# Patient Record
Sex: Male | Born: 1965 | Race: White | Hispanic: No | Smoking: Never smoker
Health system: Southern US, Community
[De-identification: ages and names within clinical notes are randomized; demographics above are authoritative.]

## PROBLEM LIST (undated history)

## (undated) DIAGNOSIS — I2510 Atherosclerotic heart disease of native coronary artery without angina pectoris: Secondary | ICD-10-CM

## (undated) DIAGNOSIS — I219 Acute myocardial infarction, unspecified: Secondary | ICD-10-CM

## (undated) DIAGNOSIS — Z95810 Presence of automatic (implantable) cardiac defibrillator: Secondary | ICD-10-CM

## (undated) DIAGNOSIS — G8929 Other chronic pain: Secondary | ICD-10-CM

## (undated) DIAGNOSIS — E119 Type 2 diabetes mellitus without complications: Secondary | ICD-10-CM

## (undated) DIAGNOSIS — E785 Hyperlipidemia, unspecified: Secondary | ICD-10-CM

## (undated) DIAGNOSIS — IMO0002 Reserved for concepts with insufficient information to code with codable children: Secondary | ICD-10-CM

## (undated) DIAGNOSIS — K5792 Diverticulitis of intestine, part unspecified, without perforation or abscess without bleeding: Secondary | ICD-10-CM

## (undated) DIAGNOSIS — F419 Anxiety disorder, unspecified: Secondary | ICD-10-CM

## (undated) DIAGNOSIS — I10 Essential (primary) hypertension: Secondary | ICD-10-CM

## (undated) DIAGNOSIS — R079 Chest pain, unspecified: Secondary | ICD-10-CM

## (undated) DIAGNOSIS — M549 Dorsalgia, unspecified: Secondary | ICD-10-CM

## (undated) DIAGNOSIS — S82899A Other fracture of unspecified lower leg, initial encounter for closed fracture: Secondary | ICD-10-CM

## (undated) DIAGNOSIS — M7989 Other specified soft tissue disorders: Secondary | ICD-10-CM

## (undated) HISTORY — PX: CARDIAC DEFIBRILLATOR PLACEMENT: SHX171

## (undated) HISTORY — DX: Chest pain, unspecified: R07.9

## (undated) HISTORY — PX: TONSILLECTOMY: SUR1361

## (undated) HISTORY — PX: EYE SURGERY: SHX253

## (undated) HISTORY — PX: HERNIA REPAIR: SHX51

---

## 2003-11-07 HISTORY — PX: CARDIAC DEFIBRILLATOR PLACEMENT: SHX171

## 2003-11-07 HISTORY — PX: PACEMAKER INSERTION: SHX728

## 2006-05-28 ENCOUNTER — Emergency Department (HOSPITAL_COMMUNITY): Admission: EM | Admit: 2006-05-28 | Discharge: 2006-05-28 | Payer: Self-pay | Admitting: Emergency Medicine

## 2010-01-24 ENCOUNTER — Emergency Department (HOSPITAL_COMMUNITY): Admission: EM | Admit: 2010-01-24 | Discharge: 2010-01-24 | Payer: Self-pay | Admitting: Emergency Medicine

## 2010-05-31 ENCOUNTER — Emergency Department (HOSPITAL_COMMUNITY): Admission: EM | Admit: 2010-05-31 | Discharge: 2010-05-31 | Payer: Self-pay | Admitting: Emergency Medicine

## 2010-08-11 ENCOUNTER — Emergency Department (HOSPITAL_COMMUNITY): Admission: EM | Admit: 2010-08-11 | Discharge: 2010-08-11 | Payer: Self-pay | Admitting: Emergency Medicine

## 2010-11-06 DIAGNOSIS — M7989 Other specified soft tissue disorders: Secondary | ICD-10-CM

## 2010-11-06 DIAGNOSIS — IMO0002 Reserved for concepts with insufficient information to code with codable children: Secondary | ICD-10-CM

## 2010-11-06 DIAGNOSIS — S82899A Other fracture of unspecified lower leg, initial encounter for closed fracture: Secondary | ICD-10-CM

## 2010-11-06 HISTORY — DX: Other specified soft tissue disorders: M79.89

## 2010-11-06 HISTORY — DX: Other fracture of unspecified lower leg, initial encounter for closed fracture: S82.899A

## 2010-11-06 HISTORY — DX: Reserved for concepts with insufficient information to code with codable children: IMO0002

## 2011-01-30 LAB — DIFFERENTIAL
Basophils Absolute: 0.1 10*3/uL (ref 0.0–0.1)
Basophils Relative: 1 % (ref 0–1)
Eosinophils Absolute: 0.4 10*3/uL (ref 0.0–0.7)
Lymphocytes Relative: 26 % (ref 12–46)
Lymphs Abs: 2.4 10*3/uL (ref 0.7–4.0)
Monocytes Relative: 9 % (ref 3–12)

## 2011-01-30 LAB — URINALYSIS, ROUTINE W REFLEX MICROSCOPIC
Hgb urine dipstick: NEGATIVE
Ketones, ur: NEGATIVE mg/dL
Protein, ur: NEGATIVE mg/dL
Urobilinogen, UA: 0.2 mg/dL (ref 0.0–1.0)
pH: 6.5 (ref 5.0–8.0)

## 2011-01-30 LAB — COMPREHENSIVE METABOLIC PANEL
ALT: 35 U/L (ref 0–53)
Albumin: 3.8 g/dL (ref 3.5–5.2)
Alkaline Phosphatase: 37 U/L — ABNORMAL LOW (ref 39–117)
CO2: 23 mEq/L (ref 19–32)
Chloride: 107 mEq/L (ref 96–112)
Creatinine, Ser: 0.93 mg/dL (ref 0.4–1.5)
GFR calc non Af Amer: >60 mL/min (ref >60)
Glucose, Bld: 117 mg/dL — ABNORMAL HIGH (ref 70–99)
Potassium: 4 mEq/L (ref 3.5–5.1)
Sodium: 142 mEq/L (ref 135–145)
Total Protein: 7.4 g/dL (ref 6.0–8.3)

## 2011-01-30 LAB — POCT CARDIAC MARKERS
CKMB, poc: <1 ng/mL — ABNORMAL LOW (ref 1.0–8.0)
CKMB, poc: <1 ng/mL — ABNORMAL LOW (ref 1.0–8.0)
Myoglobin, poc: 56.9 ng/mL (ref 12–200)
Troponin i, poc: <0.05 ng/mL (ref 0.00–0.09)
Troponin i, poc: <0.05 ng/mL (ref 0.00–0.09)

## 2011-01-30 LAB — CBC
RBC: 4.94 MIL/uL (ref 4.22–5.81)
WBC: 9.2 10*3/uL (ref 4.0–10.5)

## 2011-01-30 LAB — BRAIN NATRIURETIC PEPTIDE: Pro B Natriuretic peptide (BNP): 55 pg/mL (ref 0.0–100.0)

## 2011-12-30 ENCOUNTER — Emergency Department (HOSPITAL_COMMUNITY): Payer: Medicaid Other

## 2011-12-30 ENCOUNTER — Other Ambulatory Visit: Payer: Self-pay

## 2011-12-30 ENCOUNTER — Inpatient Hospital Stay (HOSPITAL_COMMUNITY)
Admission: EM | Admit: 2011-12-30 | Discharge: 2012-01-04 | DRG: 247 | Disposition: A | Discharge status: Home / Self Care | Payer: Medicaid Other | Attending: Cardiovascular Disease | Admitting: Cardiovascular Disease

## 2011-12-30 ENCOUNTER — Encounter (HOSPITAL_COMMUNITY)
Admission: EM | Disposition: A | Discharge status: Home / Self Care | Payer: Self-pay | Source: Home / Self Care | Attending: Cardiovascular Disease

## 2011-12-30 ENCOUNTER — Encounter (HOSPITAL_COMMUNITY): Payer: Self-pay | Admitting: Physical Medicine and Rehabilitation

## 2011-12-30 DIAGNOSIS — I252 Old myocardial infarction: Secondary | ICD-10-CM

## 2011-12-30 DIAGNOSIS — I213 ST elevation (STEMI) myocardial infarction of unspecified site: Secondary | ICD-10-CM | POA: Diagnosis present

## 2011-12-30 DIAGNOSIS — M549 Dorsalgia, unspecified: Secondary | ICD-10-CM | POA: Diagnosis present

## 2011-12-30 DIAGNOSIS — Z9861 Coronary angioplasty status: Secondary | ICD-10-CM

## 2011-12-30 DIAGNOSIS — I471 Supraventricular tachycardia: Secondary | ICD-10-CM | POA: Diagnosis not present

## 2011-12-30 DIAGNOSIS — Z7982 Long term (current) use of aspirin: Secondary | ICD-10-CM

## 2011-12-30 DIAGNOSIS — Z95810 Presence of automatic (implantable) cardiac defibrillator: Secondary | ICD-10-CM

## 2011-12-30 DIAGNOSIS — G8929 Other chronic pain: Secondary | ICD-10-CM | POA: Diagnosis present

## 2011-12-30 DIAGNOSIS — I2109 ST elevation (STEMI) myocardial infarction involving other coronary artery of anterior wall: Principal | ICD-10-CM | POA: Diagnosis present

## 2011-12-30 DIAGNOSIS — Z7902 Long term (current) use of antithrombotics/antiplatelets: Secondary | ICD-10-CM

## 2011-12-30 DIAGNOSIS — I509 Heart failure, unspecified: Secondary | ICD-10-CM

## 2011-12-30 DIAGNOSIS — I472 Ventricular tachycardia, unspecified: Secondary | ICD-10-CM | POA: Diagnosis not present

## 2011-12-30 DIAGNOSIS — I4729 Other ventricular tachycardia: Secondary | ICD-10-CM | POA: Diagnosis not present

## 2011-12-30 DIAGNOSIS — F17200 Nicotine dependence, unspecified, uncomplicated: Secondary | ICD-10-CM | POA: Diagnosis present

## 2011-12-30 DIAGNOSIS — I2510 Atherosclerotic heart disease of native coronary artery without angina pectoris: Secondary | ICD-10-CM | POA: Diagnosis present

## 2011-12-30 DIAGNOSIS — I2589 Other forms of chronic ischemic heart disease: Secondary | ICD-10-CM | POA: Diagnosis present

## 2011-12-30 DIAGNOSIS — I440 Atrioventricular block, first degree: Secondary | ICD-10-CM | POA: Diagnosis present

## 2011-12-30 DIAGNOSIS — Z23 Encounter for immunization: Secondary | ICD-10-CM

## 2011-12-30 DIAGNOSIS — I255 Ischemic cardiomyopathy: Secondary | ICD-10-CM | POA: Diagnosis present

## 2011-12-30 DIAGNOSIS — I10 Essential (primary) hypertension: Secondary | ICD-10-CM | POA: Diagnosis present

## 2011-12-30 DIAGNOSIS — Z79899 Other long term (current) drug therapy: Secondary | ICD-10-CM

## 2011-12-30 DIAGNOSIS — I2119 ST elevation (STEMI) myocardial infarction involving other coronary artery of inferior wall: Secondary | ICD-10-CM | POA: Diagnosis present

## 2011-12-30 DIAGNOSIS — E669 Obesity, unspecified: Secondary | ICD-10-CM | POA: Diagnosis present

## 2011-12-30 DIAGNOSIS — I2582 Chronic total occlusion of coronary artery: Secondary | ICD-10-CM | POA: Diagnosis present

## 2011-12-30 HISTORY — DX: Atherosclerotic heart disease of native coronary artery without angina pectoris: I25.10

## 2011-12-30 HISTORY — DX: Acute myocardial infarction, unspecified: I21.9

## 2011-12-30 HISTORY — PX: CARDIAC CATHETERIZATION: SHX172

## 2011-12-30 HISTORY — DX: Essential (primary) hypertension: I10

## 2011-12-30 HISTORY — PX: LEFT HEART CATHETERIZATION WITH CORONARY ANGIOGRAM: SHX5451

## 2011-12-30 LAB — DIFFERENTIAL
Basophils Absolute: 0 10*3/uL (ref 0.0–0.1)
Basophils Relative: 0 % (ref 0–1)
Eosinophils Absolute: 0 10*3/uL (ref 0.0–0.7)
Eosinophils Relative: 0 % (ref 0–5)
Lymphocytes Relative: 4 % — ABNORMAL LOW (ref 12–46)
Lymphs Abs: 0.7 10*3/uL (ref 0.7–4.0)
Monocytes Absolute: 0.8 10*3/uL (ref 0.1–1.0)
Monocytes Relative: 5 % (ref 3–12)
Neutro Abs: 14.9 10*3/uL — ABNORMAL HIGH (ref 1.7–7.7)
Neutrophils Relative %: 91 % — ABNORMAL HIGH (ref 43–77)

## 2011-12-30 LAB — COMPREHENSIVE METABOLIC PANEL
ALT: 32 U/L (ref 0–53)
AST: 85 U/L — ABNORMAL HIGH (ref 0–37)
Albumin: 3.1 g/dL — ABNORMAL LOW (ref 3.5–5.2)
Alkaline Phosphatase: 38 U/L — ABNORMAL LOW (ref 39–117)
BUN: 7 mg/dL (ref 6–23)
CO2: 22 mEq/L (ref 19–32)
Calcium: 8.3 mg/dL — ABNORMAL LOW (ref 8.4–10.5)
Chloride: 106 mEq/L (ref 96–112)
Creatinine, Ser: 0.88 mg/dL (ref 0.50–1.35)
GFR calc Af Amer: >90 mL/min (ref >90)
GFR calc non Af Amer: >90 mL/min (ref >90)
Glucose, Bld: 139 mg/dL — ABNORMAL HIGH (ref 70–99)
Potassium: 4.2 mEq/L (ref 3.5–5.1)
Sodium: 138 mEq/L (ref 135–145)
Total Bilirubin: 0.4 mg/dL (ref 0.3–1.2)
Total Protein: 6.5 g/dL (ref 6.0–8.3)

## 2011-12-30 LAB — CBC
HCT: 50.9 % (ref 39.0–52.0)
HCT: 41.9 % (ref 39.0–52.0)
Hemoglobin: 18.3 g/dL — ABNORMAL HIGH (ref 13.0–17.0)
Hemoglobin: 14.8 g/dL (ref 13.0–17.0)
MCH: 31.6 pg (ref 26.0–34.0)
MCHC: 35.3 g/dL (ref 30.0–36.0)
MCV: 89.3 fL (ref 78.0–100.0)
Platelets: 280 10*3/uL (ref 150–400)
Platelets: 232 10*3/uL (ref 150–400)
RBC: 4.69 MIL/uL (ref 4.22–5.81)
RDW: 13.5 % (ref 11.5–15.5)
RDW: 13.7 % (ref 11.5–15.5)
WBC: 16.4 10*3/uL — ABNORMAL HIGH (ref 4.0–10.5)

## 2011-12-30 LAB — CARDIAC PANEL(CRET KIN+CKTOT+MB+TROPI)
CK, MB: 97.1 ng/mL (ref 0.3–4.0)
CK, MB: 204.3 ng/mL (ref 0.3–4.0)
Relative Index: 8.5 — ABNORMAL HIGH (ref 0.0–2.5)
Relative Index: 9.4 — ABNORMAL HIGH (ref 0.0–2.5)
Total CK: 1136 U/L — ABNORMAL HIGH (ref 7–232)
Total CK: 2171 U/L — ABNORMAL HIGH (ref 7–232)
Troponin I: 24.32 ng/mL (ref <0.30)
Troponin I: >25 ng/mL (ref <0.30)

## 2011-12-30 LAB — POCT I-STAT, CHEM 8
BUN: 5 mg/dL — ABNORMAL LOW (ref 6–23)
Creatinine, Ser: 1 mg/dL (ref 0.50–1.35)
Potassium: 3.2 mEq/L — ABNORMAL LOW (ref 3.5–5.1)

## 2011-12-30 LAB — POCT I-STAT TROPONIN I: Troponin i, poc: 0.01 ng/mL (ref 0.00–0.08)

## 2011-12-30 LAB — PROTIME-INR
INR: 6.8 (ref 0.00–1.49)
INR: 1.85 — ABNORMAL HIGH (ref 0.00–1.49)
INR: 1.27 (ref 0.00–1.49)
Prothrombin Time: 59.9 seconds — ABNORMAL HIGH (ref 11.6–15.2)
Prothrombin Time: 16.2 seconds — ABNORMAL HIGH (ref 11.6–15.2)

## 2011-12-30 LAB — MAGNESIUM: Magnesium: 2 mg/dL (ref 1.5–2.5)

## 2011-12-30 SURGERY — LEFT HEART CATHETERIZATION WITH CORONARY ANGIOGRAM
Anesthesia: LOCAL

## 2011-12-30 MED ORDER — MIDAZOLAM HCL 2 MG/2ML IJ SOLN
INTRAMUSCULAR | Status: AC
  Filled 2011-12-30: qty 2

## 2011-12-30 MED ORDER — NITROGLYCERIN IN D5W 200-5 MCG/ML-% IV SOLN
5.0000 ug/min | INTRAVENOUS | Status: DC
  Administered 2011-12-30: 5 ug/min via INTRAVENOUS

## 2011-12-30 MED ORDER — BIOTENE DRY MOUTH MT LIQD
15.0000 mL | Freq: Two times a day (BID) | OROMUCOSAL | Status: DC
  Administered 2011-12-30 – 2012-01-04 (×8): 15 mL via OROMUCOSAL

## 2011-12-30 MED ORDER — HEPARIN SODIUM (PORCINE) 1000 UNIT/ML IJ SOLN: 5000.0000 [IU] | Freq: Once | INTRAMUSCULAR | Status: DC

## 2011-12-30 MED ORDER — ONDANSETRON HCL 4 MG/2ML IJ SOLN: 4.0000 mg | Freq: Four times a day (QID) | INTRAMUSCULAR | Status: DC | PRN

## 2011-12-30 MED ORDER — AMIODARONE HCL 150 MG/3ML IV SOLN
INTRAVENOUS | Status: AC
  Filled 2011-12-30: qty 3

## 2011-12-30 MED ORDER — ONDANSETRON HCL 4 MG/2ML IJ SOLN
4.0000 mg | Freq: Once | INTRAMUSCULAR | Status: AC
Start: 2011-12-30 — End: 2011-12-30
  Administered 2011-12-30: 4 mg via INTRAVENOUS

## 2011-12-30 MED ORDER — CLOPIDOGREL BISULFATE 300 MG PO TABS
ORAL_TABLET | ORAL | Status: AC
  Filled 2011-12-30: qty 1

## 2011-12-30 MED ORDER — AMIODARONE HCL IN DEXTROSE 360-4.14 MG/200ML-% IV SOLN
30.0000 mg/h | INTRAVENOUS | Status: DC
  Administered 2011-12-30 – 2011-12-31 (×2): 30 mg/h via INTRAVENOUS
  Filled 2011-12-30 (×3): qty 200

## 2011-12-30 MED ORDER — METOPROLOL TARTRATE 1 MG/ML IV SOLN
INTRAVENOUS | Status: AC
  Filled 2011-12-30: qty 5

## 2011-12-30 MED ORDER — MUPIROCIN 2 % EX OINT
1.0000 "application " | TOPICAL_OINTMENT | Freq: Two times a day (BID) | CUTANEOUS | Status: AC
  Administered 2011-12-30 – 2012-01-04 (×10): 1 via NASAL
  Filled 2011-12-30 (×2): qty 22

## 2011-12-30 MED ORDER — METOPROLOL TARTRATE 25 MG PO TABS
25.0000 mg | ORAL_TABLET | Freq: Two times a day (BID) | ORAL | Status: DC
Start: 2011-12-30 — End: 2012-01-04
  Administered 2011-12-30 – 2012-01-04 (×11): 25 mg via ORAL
  Filled 2011-12-30 (×12): qty 1

## 2011-12-30 MED ORDER — ACETAMINOPHEN 325 MG PO TABS: 650.0000 mg | ORAL_TABLET | ORAL | Status: DC | PRN

## 2011-12-30 MED ORDER — NITROGLYCERIN IN D5W 200-5 MCG/ML-% IV SOLN
INTRAVENOUS | Status: AC
  Filled 2011-12-30: qty 250

## 2011-12-30 MED ORDER — ASPIRIN 81 MG PO CHEW
CHEWABLE_TABLET | ORAL | Status: AC
  Administered 2011-12-30: 324 mg
  Filled 2011-12-30: qty 4

## 2011-12-30 MED ORDER — HEPARIN BOLUS VIA INFUSION: 4000.0000 [IU] | Freq: Once | INTRAVENOUS | Status: DC

## 2011-12-30 MED ORDER — NITROGLYCERIN IN D5W 200-5 MCG/ML-% IV SOLN
2.0000 ug/min | INTRAVENOUS | Status: DC
  Administered 2011-12-31: 5 ug/min via INTRAVENOUS

## 2011-12-30 MED ORDER — ALUM & MAG HYDROXIDE-SIMETH 200-200-20 MG/5ML PO SUSP
30.0000 mL | Freq: Four times a day (QID) | ORAL | Status: DC | PRN
  Administered 2011-12-30: 30 mL via ORAL
  Filled 2011-12-30: qty 30

## 2011-12-30 MED ORDER — ATROPINE SULFATE 1 MG/ML IJ SOLN
INTRAMUSCULAR | Status: AC
  Filled 2011-12-30: qty 1

## 2011-12-30 MED ORDER — MORPHINE SULFATE 2 MG/ML IJ SOLN
2.0000 mg | Freq: Once | INTRAMUSCULAR | Status: AC
  Administered 2011-12-30: 2 mg via INTRAVENOUS

## 2011-12-30 MED ORDER — MORPHINE SULFATE 4 MG/ML IJ SOLN
2.0000 mg | INTRAMUSCULAR | Status: DC | PRN
  Administered 2011-12-30: 2 mg via INTRAVENOUS
  Filled 2011-12-30: qty 1

## 2011-12-30 MED ORDER — ONDANSETRON HCL 4 MG/2ML IJ SOLN
INTRAMUSCULAR | Status: AC
  Filled 2011-12-30: qty 2

## 2011-12-30 MED ORDER — CLOPIDOGREL BISULFATE 75 MG PO TABS
75.0000 mg | ORAL_TABLET | Freq: Every day | ORAL | Status: DC
Start: 2011-12-31 — End: 2012-01-04
  Administered 2011-12-31 – 2012-01-04 (×5): 75 mg via ORAL
  Filled 2011-12-30 (×6): qty 1

## 2011-12-30 MED ORDER — AMIODARONE HCL IN DEXTROSE 360-4.14 MG/200ML-% IV SOLN
60.0000 mg/h | INTRAVENOUS | Status: AC
  Administered 2011-12-30: 60 mg/h via INTRAVENOUS

## 2011-12-30 MED ORDER — MORPHINE SULFATE 4 MG/ML IJ SOLN
INTRAMUSCULAR | Status: AC
  Administered 2011-12-30: 4 mg
  Filled 2011-12-30: qty 1

## 2011-12-30 MED ORDER — CHLORHEXIDINE GLUCONATE CLOTH 2 % EX PADS
6.0000 | MEDICATED_PAD | Freq: Every day | CUTANEOUS | Status: AC
  Administered 2011-12-31 – 2012-01-04 (×5): 6 via TOPICAL

## 2011-12-30 MED ORDER — ALPRAZOLAM 0.25 MG PO TABS
0.2500 mg | ORAL_TABLET | Freq: Three times a day (TID) | ORAL | Status: DC | PRN
  Administered 2011-12-30 – 2012-01-04 (×12): 0.25 mg via ORAL
  Filled 2011-12-30 (×12): qty 1

## 2011-12-30 MED ORDER — ASPIRIN EC 81 MG PO TBEC: 81.0000 mg | DELAYED_RELEASE_TABLET | Freq: Every day | ORAL | Status: DC

## 2011-12-30 MED ORDER — SODIUM CHLORIDE 0.9 % IV SOLN: 0.2500 mg/kg/h | INTRAVENOUS | Status: DC

## 2011-12-30 MED ORDER — BIVALIRUDIN 250 MG IV SOLR
INTRAVENOUS | Status: AC
  Filled 2011-12-30: qty 250

## 2011-12-30 MED ORDER — ZOLPIDEM TARTRATE 5 MG PO TABS: 10.0000 mg | ORAL_TABLET | Freq: Every evening | ORAL | Status: DC | PRN

## 2011-12-30 MED ORDER — AMIODARONE HCL IN DEXTROSE 360-4.14 MG/200ML-% IV SOLN
60.0000 mg/h | INTRAVENOUS | Status: DC
  Filled 2011-12-30 (×2): qty 200

## 2011-12-30 MED ORDER — NITROGLYCERIN 0.4 MG SL SUBL: 0.4000 mg | SUBLINGUAL_TABLET | SUBLINGUAL | Status: DC | PRN

## 2011-12-30 MED ORDER — ASPIRIN EC 325 MG PO TBEC
325.0000 mg | DELAYED_RELEASE_TABLET | Freq: Every day | ORAL | Status: DC
  Administered 2011-12-31 – 2012-01-04 (×5): 325 mg via ORAL
  Filled 2011-12-30 (×5): qty 1

## 2011-12-30 MED ORDER — MORPHINE SULFATE 2 MG/ML IJ SOLN
INTRAMUSCULAR | Status: AC
  Filled 2011-12-30: qty 1

## 2011-12-30 MED ORDER — FENTANYL CITRATE 0.05 MG/ML IJ SOLN
INTRAMUSCULAR | Status: AC
  Filled 2011-12-30: qty 2

## 2011-12-30 MED ORDER — LIDOCAINE HCL (PF) 1 % IJ SOLN
INTRAMUSCULAR | Status: AC
  Filled 2011-12-30: qty 30

## 2011-12-30 MED ORDER — NITROGLYCERIN 0.2 MG/ML ON CALL CATH LAB
INTRAVENOUS | Status: AC
  Filled 2011-12-30: qty 1

## 2011-12-30 MED ORDER — COLCHICINE 0.6 MG PO TABS
0.6000 mg | ORAL_TABLET | Freq: Every day | ORAL | Status: DC
  Administered 2011-12-30 – 2012-01-04 (×6): 0.6 mg via ORAL
  Filled 2011-12-30 (×6): qty 1

## 2011-12-30 MED ORDER — HEPARIN SODIUM (PORCINE) 5000 UNIT/ML IJ SOLN
INTRAMUSCULAR | Status: AC
  Administered 2011-12-30: 4000 [IU] via INTRAVENOUS
  Filled 2011-12-30: qty 1

## 2011-12-30 MED ORDER — HEPARIN (PORCINE) IN NACL 2-0.9 UNIT/ML-% IJ SOLN
INTRAMUSCULAR | Status: AC
  Filled 2011-12-30: qty 2000

## 2011-12-30 MED ORDER — SODIUM CHLORIDE 0.9 % IV SOLN
INTRAVENOUS | Status: DC
  Administered 2011-12-31: 08:00:00 via INTRAVENOUS

## 2011-12-30 MED ORDER — OXYCODONE-ACETAMINOPHEN 5-325 MG PO TABS
1.0000 | ORAL_TABLET | ORAL | Status: DC | PRN
  Administered 2011-12-30 – 2012-01-04 (×25): 2 via ORAL
  Filled 2011-12-30 (×25): qty 2

## 2011-12-30 MED ORDER — ATORVASTATIN CALCIUM 40 MG PO TABS
40.0000 mg | ORAL_TABLET | Freq: Every day | ORAL | Status: DC
  Administered 2011-12-30: 40 mg via ORAL
  Filled 2011-12-30 (×2): qty 1

## 2011-12-30 MED ORDER — AMIODARONE HCL IN DEXTROSE 360-4.14 MG/200ML-% IV SOLN
60.0000 mg/h | INTRAVENOUS | Status: AC
  Administered 2011-12-30: 33.3333 mL/h via INTRAVENOUS
  Filled 2011-12-30 (×4): qty 200

## 2011-12-30 MED ORDER — MORPHINE SULFATE 4 MG/ML IJ SOLN: 4.0000 mg | Freq: Once | INTRAMUSCULAR | Status: DC

## 2011-12-30 NOTE — Progress Notes (Signed)
Right femoral artery sheath was pulled out at 2058 and pressure held at site for 30 minutes. Patient education done.

## 2011-12-30 NOTE — ED Notes (Signed)
Pt presents to department via [facility] from home for evaluation midsternal non radiating chest pain and headache. Onset today around 8am. Pt states he became dizzy and defibrillator shocked him x1. N/V began after episode. History of MI x3 with stent placement. He is conscious alert and oriented x4. 20g L hand. NSR on monitor. Received x1 nitroglycerin, 4mg  zofran and 324 ASA per EMS.

## 2011-12-30 NOTE — ED Provider Notes (Addendum)
History     CSN: [id number]  Arrival date & time 12/30/11  1023   First MD Initiated Contact with Patient 12/30/11 1024      Chief Complaint  Patient presents with  . Chest Pain   46 year old gentleman with a known history of previous myocardial infarction and previous cardiac stenting done in [place]. Patient states he awoke with severe, right-sided chest pain, and pressure. This began approximately around 8 AM he then states he became dizzy and apparently has defibrillator  shocked him 1 time at home. He began having nausea and vomiting After this episode. Patient presented by EMS. Patient had been given an aspirin and Zofran in route and had an IV established. Patient was unable to describe the exact character of the pain. This was due to his ongoing pain and discomfort and vomiting. . There was apparently no syncope. He is on multiple cardiac medications including Plavix and has been compliant with this. Family history is unknown. His social history apparently nonsmoker.  Denies any specific alleviating or aggravating factors denies any specific radiation of this pain. He does state that it feels similar to his previous MIs.  (Consider location/radiation/quality/duration/timing/severity/associated sxs/prior treatment) HPI  Past Medical History  Diagnosis Date  . Myocardial infarction   . Hypertension     No past surgical history on file.  History reviewed. No pertinent family history.  History  Substance Use Topics  . Smoking status: Never Smoker   . Smokeless tobacco: Not on file  . Alcohol Use: No      Review of Systems  All other systems reviewed and are negative.    Allergies  Review of patient's allergies indicates no known allergies.  Home Medications  No current outpatient prescriptions on file.  BP 101/61  Pulse 60  Temp(Src) 97.5 F (36.4 C) (Oral)  Resp 20  SpO2 99%  Physical Exam  Nursing note and vitals reviewed. Constitutional: He is oriented  to person, place, and time. He appears well-developed and well-nourished.       The patient appeared to be diaphoretic, and a fair amount of distress secondary to his pain  HENT:  Head: Normocephalic and atraumatic.  Eyes: Conjunctivae and EOM are normal. Pupils are equal, round, and reactive to light.  Neck: Neck supple.  Cardiovascular: Normal rate and regular rhythm.  Exam reveals no gallop and no friction rub.   No murmur heard. Pulmonary/Chest: Breath sounds normal. He has no wheezes. He has no rales. He exhibits no tenderness.  Abdominal: Soft. Bowel sounds are normal. He exhibits no distension. There is no tenderness. There is no rebound and no guarding.  Musculoskeletal: Normal range of motion.  Neurological: He is alert and oriented to person, place, and time. No cranial nerve deficit. Coordination normal.  Skin: Skin is warm. No rash noted. He is diaphoretic.  Psychiatric: He has a normal mood and affect.    ED Course  Procedures (including critical care time)  Labs Reviewed  CBC - Abnormal; Notable for the following:    WBC 16.9 (*)    Hemoglobin 18.3 (*)    All other components within normal limits  POCT I-STAT, CHEM 8 - Abnormal; Notable for the following:    Potassium 3.2 (*)    BUN 5 (*)    Glucose, Bld 142 (*)    Hemoglobin 18.4 (*)    HCT 54.0 (*)    All other components within normal limits  POCT I-STAT TROPONIN I   Dg Chest [facility] 1 [address]  12/30/2011  *RADIOLOGY REPORT*  Clinical Data: Chest pain.  Shortness of breath.  Pacing defibrillator firing today.  PORTABLE CHEST - 1 VIEW 12/30/2011:  Comparison: Two-view chest x-ray 01/24/2010 [facility].  Findings: Suboptimal inspiration due to body habitus which accounts for crowded bronchovascular markings at the bases and accentuates the cardiac silhouette.  Taking this into account, cardiac silhouette enlarged but stable.  Mild diffuse interstitial pulmonary edema.  Small right pleural effusion.  No visible left  pleural effusion.  Left subclavian pacing defibrillator unchanged and appears intact.  IMPRESSION: Suboptimal inspiration.  Mild CHF, with stable cardiomegaly and mild diffuse interstitial pulmonary edema.  Small right pleural effusion.  Original Report Authenticated By: Arnell Sieving, M.D.     1. STEMI (ST elevation myocardial infarction)       MDM  Patient is seen and examined, initial history and physical is completed. Evaluation initiated    Stat EKG done, showing Q waves anteriorly, and subtle ST elevation inferiorly code STEMI was called immediately. Simultaneously,  Patient had a second IV placed was continued on the cardiac monitor, oxygen, continuous pulse oximetry. The patient had already been given an aspirin in route. His initial blood pressure was low therefore, a fluid bolus was given prior to starting nitroglycerin drip. Dr. Nicholaus Bloom was contacted, as well as Jan from the race team. Dr. Nicholaus Bloom did present to the bedside and requested a heparin bolus, which was also given. Stat awaiting for the catheter lab team to arrive to take the patient to the Cath Lab. Blood pressure remained stable with systolic at 106. Stat labs and stat portable chest x-ray had also been ordered simultaneously upon his arrival. Patient remains awake, alert, oriented.  11:41 AM Results for orders placed during the hospital encounter of 12/30/11  CBC      Component Value Range   WBC 16.9 (*) 4.0 - 10.5 (K/uL)   RBC 5.66  4.22 - 5.81 (MIL/uL)   Hemoglobin 18.3 (*) 13.0 - 17.0 (g/dL)   HCT 44.0  10.2 - 72.5 (%)   MCV 89.9  78.0 - 100.0 (fL)   MCH 32.3  26.0 - 34.0 (pg)   MCHC 36.0  30.0 - 36.0 (g/dL)   RDW 36.6  44.0 - 34.7 (%)   Platelets 280  150 - 400 (K/uL)  POCT I-STAT, CHEM 8      Component Value Range   Sodium 142  135 - 145 (mEq/L)   Potassium 3.2 (*) 3.5 - 5.1 (mEq/L)   Chloride 106  96 - 112 (mEq/L)   BUN 5 (*) 6 - 23 (mg/dL)   Creatinine, Ser 4.25  0.50 - 1.35 (mg/dL)   Glucose,  Bld 956 (*) 70 - 99 (mg/dL)   Calcium, Ion 3.87  5.64 - 1.32 (mmol/L)   TCO2 23  0 - 100 (mmol/L)   Hemoglobin 18.4 (*) 13.0 - 17.0 (g/dL)   HCT 33.2 (*) 95.1 - 52.0 (%)  POCT I-STAT TROPONIN I      Component Value Range   Troponin i, poc 0.01  0.00 - 0.08 (ng/mL)   Comment 3            No results found.      CRITICAL CARE Performed by: Orlene Och.   Total critical care time:30  Critical care time was exclusive of separately billable procedures and treating other patients.  Critical care was necessary to treat or prevent imminent or life-threatening deterioration.  Critical care was time spent personally by me on the  following activities: development of treatment plan with patient and/or surrogate as well as nursing, discussions with consultants, evaluation of patient's response to treatment, examination of patient, obtaining history from patient or surrogate, ordering and performing treatments and interventions, ordering and review of laboratory studies, ordering and review of radiographic studies, pulse oximetry and re-evaluation of patient's condition.       Marvena Tally A. Chosen Geske, MD 12/30/11 1056   Date: 12/30/2011    10:27am   Rate: 74  Rhythm: normal sinus rhythm  QRS Axis: normal  Intervals: normal  ST/T Wave abnormalities: ST elevations inferiorly and ST elevations anteriorly  Conduction Disutrbances:none  Narrative Interpretation:   Old EKG Reviewed: changes noted    Date: 12/30/2011    10:33am  Rate: 66  Rhythm: normal sinus rhythm  QRS Axis: normal  Intervals: normal  ST/T Wave abnormalities: ST elevations inferiorly and ST elevations anteriorly  Conduction Disutrbances:none  Narrative Interpretation:   Old EKG Reviewed: changes noted Intermittent pacer spikes and dynamic st changes. Reviewed with cardiologist.      Lorelle Gibbs. Patrica Duel, MD 12/30/11 1102   Medications  METOPROLOL TARTRATE PO (  Oral MAR Hold 12/30/11 1130)  ondansetron (ZOFRAN) 4  MG/2ML injection (not administered)  nitroGLYCERIN 0.2 mg/mL in dextrose 5 % infusion (  Intravenous MAR Hold 12/30/11 1130)  nitroGLYCERIN 0.2 mg/mL in dextrose 5 % infusion (not administered)  colchicine 0.6 MG tablet (  Oral MAR Hold 12/30/11 1130)  enalapril (VASOTEC) 20 MG tablet (  Oral MAR Hold 12/30/11 1130)  ALPRAZolam (XANAX) 0.25 MG tablet (  Oral MAR Hold 12/30/11 1130)  morphine 2 MG/ML injection (not administered)  morphine 4 MG/ML injection (4 mg  Given 12/30/11 1042)  ondansetron (ZOFRAN) injection 4 mg (4 mg Intravenous Given 12/30/11 1044)  heparin 5000 UNIT/ML injection (4000 Units Intravenous Given 12/30/11 1043)  aspirin 81 MG chewable tablet (324 mg  Given 12/30/11 1046)  morphine 2 MG/ML injection 2 mg (2 mg Intravenous Given 12/30/11 1103)  nitroGLYCERIN (NTG ON-CALL) 0.2 mg/mL injection (not administered)  lidocaine (XYLOCAINE) 1 % injection (not administered)  heparin 2-0.9 UNIT/ML-% infusion (not administered)  midazolam (VERSED) 2 MG/2ML injection (not administered)  fentaNYL (SUBLIMAZE) 0.05 MG/ML injection (not administered)  bivalirudin (ANGIOMAX) 250 MG injection (not administered)  amiodarone (CORDARONE) 150 MG/3ML injection (not administered)     Jayla Mackie A. Patrica Duel, MD 12/30/11 1214

## 2011-12-30 NOTE — Progress Notes (Signed)
INR called to Dr Ralene Bathe received.

## 2011-12-30 NOTE — Progress Notes (Signed)
Chaplain's Note:  Responded to code stemi page.  Affirmed pt had family on the way.  Asked to be page if family needed assistance.  Please page if needed or requested. [place]  [phone] on-call pager

## 2011-12-30 NOTE — ED Notes (Signed)
Pt transported to the cath lab, vital signs remain stable. Pt remains alert and oriented x4.

## 2011-12-30 NOTE — H&P (Signed)
Patient ID: Brees Hounshell MRN: [id number], DOB/AGE: 07-13-1966   Admit date: 12/30/2011   Primary Physician: No primary provider on file. Primary Cardiologist: ?  HPI: The patient is a 46 year old male with a history of coronary disease. He apparently had 2 stents placed in 2005 [place]. It appears he was here in March of 2011 and had his defibrillator checked. It is a [employer] that was implanted 2007.  The patient  recently arrived in [place] to visit his sister last night.  Approximatley 8 AM he developed substernal chest pain. He says his defibrillator went off once. EMS was called around 10 AM. His initial EKG did not show any acute ST elevation. In the emergency room he continued to have chest pain and another EKG was done showing inferior and anterior ST elevation. Code STEMI was activated. The patient's extremely uncomfortable and detailed history is difficult to obtain at this time. He is taken urgently to the cath lab by Dr. Tresa Endo.    Problem List: Past Medical History  Diagnosis Date  . Myocardial infarction   . Hypertension     No past surgical history on file.   Allergies: No Known Allergies   Home Medications Percocet (4 different empty bottles) Zofran prn Xanax Enalapril 20mg  Metoprolol 50mg  BID Plavix ASA 81mg   History reviewed. No pertinent family history.   History   Social History  . Marital Status: Married    Spouse Name: N/A    Number of Children: N/A  . Years of Education: N/A   Occupational History  . Not on file.   Social History Main Topics  . Smoking status: Never Smoker   . Smokeless tobacco: Not on file  . Alcohol Use: No  . Drug Use: No  . Sexually Active:    Other Topics Concern  . Not on file   Social History Narrative  . No narrative on file     Review of Systems: Unobtainable at this time secondary to patient condition.   Physical Exam: Blood pressure 101/61, pulse 60, temperature 97.5 F (36.4 C),  temperature source Oral, resp. rate 20, SpO2 99.00%.  General appearance: cooperative, moderate distress, moderately obese, pale and toxic Neck: no adenopathy, no carotid bruit, no JVD, supple, symmetrical, trachea midline and thyroid not enlarged, symmetric, no tenderness/mass/nodules Lungs: clear to auscultation bilaterally Heart: regular rate and rhythm Abdomen: obese, non tender Extremities: extremities normal, atraumatic, no cyanosis or edema Pulses: 2+ and symmetric Skin: cool and clammy Neurologic: Grossly normal    Labs:   Results for orders placed during the hospital encounter of 12/30/11 (from the past 24 hour(s))  CBC     Status: Abnormal   Collection Time   12/30/11 10:25 AM      Component Value Range   WBC 16.9 (*) 4.0 - 10.5 (K/uL)   RBC 5.66  4.22 - 5.81 (MIL/uL)   Hemoglobin 18.3 (*) 13.0 - 17.0 (g/dL)   HCT 40.9  81.1 - 91.4 (%)   MCV 89.9  78.0 - 100.0 (fL)   MCH 32.3  26.0 - 34.0 (pg)   MCHC 36.0  30.0 - 36.0 (g/dL)   RDW 78.2  95.6 - 21.3 (%)   Platelets 280  150 - 400 (K/uL)  POCT I-STAT TROPONIN I     Status: Normal   Collection Time   12/30/11 10:38 AM      Component Value Range   Troponin i, poc 0.01  0.00 - 0.08 (ng/mL)   Comment 3  POCT I-STAT, CHEM 8     Status: Abnormal   Collection Time   12/30/11 10:40 AM      Component Value Range   Sodium 142  135 - 145 (mEq/L)   Potassium 3.2 (*) 3.5 - 5.1 (mEq/L)   Chloride 106  96 - 112 (mEq/L)   BUN 5 (*) 6 - 23 (mg/dL)   Creatinine, Ser 4.09  0.50 - 1.35 (mg/dL)   Glucose, Bld 811 (*) 70 - 99 (mg/dL)   Calcium, Ion 9.14  7.82 - 1.32 (mmol/L)   TCO2 23  0 - 100 (mmol/L)   Hemoglobin 18.4 (*) 13.0 - 17.0 (g/dL)   HCT 95.6 (*) 21.3 - 52.0 (%)     Radiology/Studies: No results found.  EKG:NSR SB with inf lat ST elevation  ASSESSMENT AND PLAN:  Principal Problem:  *STEMI (ST elevation myocardial infarction) Active Problems:  CAD (coronary artery disease), Hx of prior stents ? 2005 in  [place]  ICD (implantable cardiac defibrillator) in place, BS, implanted 2007  Obesity  Plan- Urgent cath. He will need ICD interrogated.  Deland Pretty, PA-C 12/30/2011, 11:17 AM    Patient seen and examined. Agree with assessment and plan. Pt currently resides in [place], and arrived in [place] yesterday to visit sister. According to patient, he is s/p MI 2001 with stenting, and in 2003 with an additional stent.  He is s/p ICD in 2005.  Presents now with STEMI anterioly and also with inferior changes, s/p ICD discharge prior to hospital arrival while in bed.  Discussed with plan, plan emergent cath.   Lennette Bihari, MD, [facility] 12/30/2011 1:39 PM

## 2011-12-30 NOTE — ED Notes (Signed)
Dr. Patrica Duel at the bedside. STEMI called. Dr. Tresa Endo to arrive and evaluate.

## 2011-12-30 NOTE — Brief Op Note (Signed)
12/30/2011  1:28 PM  PATIENT:  Gerald Simpson  46 y.o. male  PRE-OPERATIVE DIAGNOSIS:  STEMI  Full note dictated; see diagram   DICTATION # [id number], [id number]  Lennette Bihari, MD, [facility] 12/30/2011 1:28 PM

## 2011-12-30 NOTE — ED Notes (Signed)
Dr. Tresa Endo and EDP at the bedside. Pt to be taken to cath lab. Vital signs stable at the time.

## 2011-12-30 NOTE — ED Notes (Signed)
Pt presents to department for evaluation of midsternal non radiating chest pain. Onset this morning around 8am. Pt states "this feels like my last MI." upon arrival pt diaphoretic and having 10/10 burning chest pain. Respirations unlabored. Pt conscious alert and oriented x4.

## 2011-12-30 NOTE — Cardiovascular Report (Signed)
NAMEJERIMY, Simpson NO.:  [id number]  MEDICAL RECORD NO.:  [id number]  LOCATION:  2904                         FACILITY:  [facility]  PHYSICIAN:  Nicki Guadalajara, M.D.     DATE OF BIRTH:  1966/06/12  DATE OF PROCEDURE: DATE OF DISCHARGE:                           CARDIAC CATHETERIZATION   INDICATIONS:  [name] is a 46 year old gentleman who resides in [place], [place].  He apparently came to [place] last night to visit his sister.  The patient has significant cardiac history that dates back to 2001 when he suffered his initial myocardial infarction and underwent stenting of a vessel at that time.  In 2003, he apparently suffered another myocardial infarction and underwent stenting at that time.  He tells me he had defibrillator inserted in 2005 and these procedures were done in [place].  Apparently, the patient had been developing recurrent chest pain which somewhat attributed to anxiety. He presented to the [facility] Emergency Room today in the setting of an acute ST-segment elevation MI with diffuse ST-segment elevation anteriorly as well as inferiorly.  The patient also admits that he was shocked by his defibrillator when he developed chest pain which awakened him from sleep.  Initially, in the emergency room, he was hypotensive. He was given heparin 4000 units, morphine and ultimately was transferred acutely to the Cardiac Catheterization Laboratory.  PROCEDURE:  Upon arrival to the Catheterizations Laboratory, the patient was still having significant chest pain.  His right femoral artery was punctured anteriorly and a 6-French sheath was inserted.  Diagnostic catheterization was done utilizing 6-French Judkins #4 left and right coronary catheters.  With the demonstration of total LAD occlusion proximal to the proximal LAD stent, emergent percutaneous coronary intervention was performed.  The patient was given Angiomax bolus plus infusion.   He also received an additional 300 mg of Plavix.  The patient states he had been on Plavix 75 mg daily in addition to atenolol.  After therapeutic anticoagulation was ascertained, 6-French XB-LAD guide was used and Asahi medium wire was able to cross the total proximal LAD occlusion.  The patient developed reperfusion ventricular tachycardia with rates in the 110s-120s.  He was bolused initially with 300 mg of IV amiodarone and was also started on a drip.  In addition, he received IV Lopressor 5 mg.  Initial dilatation was done at the site of the occlusion with a 2.5 x 12 mm Sprinter Legend balloon.  This was then upgraded to a 3.5 x 15 mm noncompliant Sprinter.  This was dilated with restoration of TIMI-3 flow.  It became apparent that there was still narrowing distal to the stented segment.  For this reason, the decision was made to place a stent to cover the entire region and a 3.75 x  24 mm Promus Element DES stent was then inserted.  Post-stent dilatation was done with a 3.75 x 20 mm noncompliant Quantum with dilatation up to 3.75 mm.  It was also apparent that there was probable chronic occlusion of the apical portion of the LAD.  This was crossed with the Asahi medium wire.  PTCA was done with the 2.5 balloon, but on very low atmospheres corresponding  to approximately 2.2 mm.  This apparently was a chronic occlusion.  This did not restore some flow to the apical LAD region. The vessel was too small to stent.  Multiple dilatations were made still with residual narrowing apically in a small-caliber vessel.  The patient again had some recurrent VT during the procedure and did receive an additional 150 mg bolus of amiodarone and did receive additional IV Lopressor.  A pigtail catheter was then inserted and RAO ventriculography was done utilizing 30 mL of contrast at a flow rate of 12 mL/second.  The patient tolerated the procedure well.  At the completion of the procedure, he had stable  hemodynamics, was pain free, and will be transported to the coronary care unit for further treatment.  HEMODYNAMIC DATA:  Central aortic pressure was 120/83.  Left ventricular pressure 120/36.  ANGIOGRAPHIC DATA:  Left main coronary artery was a large smooth vessel that had ostial narrowing of 50-60%.  The left main bifurcated into an LAD and left circumflex system.  The LAD was totally occluded proximally after giving rise to the first septal perforating artery just proximal to the stented segment.  There was TIMI 0 flow.  Circumflex vessel was a large dominant vessel.  There did appear to be a long area of stented segment in the mid AV groove circumflex just after the second marginal branch and prior to the distal vessels.  The right coronary artery was a nondominant small vessel that had 70% proximal stenosis.  Following intervention to the LAD with PTCA, noncompliant balloon dilatation, and ultimate insertion of a new 3.5 x 24 mm Promus Element DES stent post dilated at 3.75 mm, the entire proximal area was reduced to 0%.  Again, there was a 100% apical occlusion of the LAD with TIMI 0 flow.  This was dilated with low level PTCA and following dilatation this was small caliber, but there was some mild flow going down to the apical portion of the LAD.  IMPRESSION: 1. Acute ST-segment elevation myocardial infarction complicated by     probable ventricular tachycardia arrest leading to implantable     cardioverter-defibrillator discharge prior to hospital arrival. 2. Total proximal left anterior descending occlusion at the site just     proximal to the previously placed proximal left anterior descending     stent with TIMI 0 flow. 3. Widely patent stents in the left circumflex coronary artery.  It is     a dominant left circumflex coronary artery. 4. Small nondominant right coronary artery with 60-70% proximal     stenosis. 5. Successful percutaneous coronary intervention with  percutaneous     transluminal coronary angioplasty and stenting of the totally     occluded proximal left anterior descending with restoration of TIMI-     3 flow and 100% occlusion being reduced to 0% with ultimate     insertion of a 3.5 x 24 mm Promus Element DES stent post dilated at     3.75 mm to cover an area of stenosis just beyond the initially     placed stent and with total occlusion of the apical portion of the     left anterior descending with some improvement in flow following     percutaneous transluminal coronary angioplasty apically. 6. Recurrent ventricular tachycardia requiring amiodarone bolus, IV     Lopressor, and amiodarone drip. 7. Ischemic cardiomyopathy.          ______________________________ Nicki Guadalajara, M.D.     TK/MEDQ  D:  12/30/2011  T:  12/30/2011  Job:  [id number]  cc:   Dr. Deretha Emory Dr. Colin Broach

## 2011-12-31 DIAGNOSIS — I255 Ischemic cardiomyopathy: Secondary | ICD-10-CM | POA: Diagnosis present

## 2011-12-31 DIAGNOSIS — I471 Supraventricular tachycardia, unspecified: Secondary | ICD-10-CM | POA: Diagnosis not present

## 2011-12-31 LAB — CARDIAC PANEL(CRET KIN+CKTOT+MB+TROPI)
CK, MB: 150.1 ng/mL (ref 0.3–4.0)
Relative Index: 8.7 — ABNORMAL HIGH (ref 0.0–2.5)
Total CK: 1732 U/L — ABNORMAL HIGH (ref 7–232)
Troponin I: >25 ng/mL (ref <0.30)

## 2011-12-31 LAB — BASIC METABOLIC PANEL
BUN: 6 mg/dL (ref 6–23)
CO2: 22 mEq/L (ref 19–32)
Calcium: 8.6 mg/dL (ref 8.4–10.5)
Chloride: 102 mEq/L (ref 96–112)
Creatinine, Ser: 0.93 mg/dL (ref 0.50–1.35)
GFR calc Af Amer: >90 mL/min (ref >90)
GFR calc non Af Amer: >90 mL/min (ref >90)
Glucose, Bld: 124 mg/dL — ABNORMAL HIGH (ref 70–99)
Potassium: 3.4 mEq/L — ABNORMAL LOW (ref 3.5–5.1)
Sodium: 136 mEq/L (ref 135–145)

## 2011-12-31 LAB — HEMOGLOBIN A1C
Hgb A1c MFr Bld: 5.6 % (ref <5.7)
Mean Plasma Glucose: 114 mg/dL (ref <117)

## 2011-12-31 LAB — CBC
HCT: 40.9 % (ref 39.0–52.0)
Hemoglobin: 14.1 g/dL (ref 13.0–17.0)
MCH: 31.3 pg (ref 26.0–34.0)
MCHC: 34.5 g/dL (ref 30.0–36.0)
MCV: 90.9 fL (ref 78.0–100.0)
Platelets: 205 10*3/uL (ref 150–400)
RBC: 4.5 MIL/uL (ref 4.22–5.81)
RDW: 14 % (ref 11.5–15.5)
WBC: 10.9 10*3/uL — ABNORMAL HIGH (ref 4.0–10.5)

## 2011-12-31 LAB — TSH: TSH: 0.366 u[IU]/mL (ref 0.350–4.500)

## 2011-12-31 LAB — POCT ACTIVATED CLOTTING TIME: Activated Clotting Time: 815 seconds

## 2011-12-31 MED ORDER — ISOSORBIDE MONONITRATE ER 60 MG PO TB24
60.0000 mg | ORAL_TABLET | Freq: Every day | ORAL | Status: DC
  Administered 2011-12-31 – 2012-01-02 (×3): 60 mg via ORAL
  Filled 2011-12-31 (×5): qty 1

## 2011-12-31 MED ORDER — ATORVASTATIN CALCIUM 80 MG PO TABS
80.0000 mg | ORAL_TABLET | Freq: Every day | ORAL | Status: DC
  Administered 2011-12-31 – 2012-01-03 (×4): 80 mg via ORAL
  Filled 2011-12-31 (×5): qty 1

## 2011-12-31 MED ORDER — METOPROLOL TARTRATE 1 MG/ML IV SOLN
5.0000 mg | Freq: Once | INTRAVENOUS | Status: AC
  Administered 2011-12-31: 5 mg via INTRAVENOUS

## 2011-12-31 MED ORDER — METOPROLOL TARTRATE 1 MG/ML IV SOLN
INTRAVENOUS | Status: AC
  Filled 2011-12-31: qty 5

## 2011-12-31 MED ORDER — LISINOPRIL 5 MG PO TABS
5.0000 mg | ORAL_TABLET | Freq: Every day | ORAL | Status: DC
  Administered 2011-12-31 – 2012-01-02 (×3): 5 mg via ORAL
  Filled 2011-12-31 (×4): qty 1

## 2011-12-31 MED ORDER — AMIODARONE HCL 200 MG PO TABS
400.0000 mg | ORAL_TABLET | Freq: Three times a day (TID) | ORAL | Status: DC
  Administered 2011-12-31 – 2012-01-02 (×9): 400 mg via ORAL
  Filled 2011-12-31 (×12): qty 2

## 2011-12-31 MED ORDER — POTASSIUM CHLORIDE CRYS ER 20 MEQ PO TBCR
40.0000 meq | EXTENDED_RELEASE_TABLET | Freq: Once | ORAL | Status: AC
  Administered 2011-12-31: 40 meq via ORAL
  Filled 2011-12-31: qty 2

## 2011-12-31 NOTE — Progress Notes (Addendum)
Subjective:  Episode of tachycardia last night, better this am.  Objective:  Vital Signs in the last 24 hours: Temp:  [97.4 F (36.3 C)-98.7 F (37.1 C)] 98.3 F (36.8 C) (02/24 0352) Pulse Rate:  [58-115] 59  (02/24 0500) Resp:  [10-22] 17  (02/24 0500) BP: (97-144)/(59-105) 114/74 mmHg (02/24 0500) SpO2:  [95 %-99 %] 96 % (02/24 0500) Arterial Line BP: (125-151)/(74-110) 143/80 mmHg (02/23 2000) Weight:  [130.1 kg (286 lb 13.1 oz)-130.3 kg (287 lb 4.2 oz)] 130.3 kg (287 lb 4.2 oz) (02/24 0352)  Intake/Output from previous day:  Intake/Output Summary (Last 24 hours) at 12/31/11 0708 Last data filed at 12/31/11 0600  Gross per 24 hour  Intake 2542.45 ml  Output   2990 ml  Net -447.55 ml      . amiodarone      . amiodarone      . amiodarone      . amiodarone      . amiodarone (NEXTERONE PREMIX) 360 mg/200 mL dextrose  60 mg/hr Intravenous To Cath  . antiseptic oral rinse  15 mL Mouth Rinse BID  . aspirin      . aspirin EC  325 mg Oral Daily  . atorvastatin  40 mg Oral q1800  . atropine      . bivalirudin      . bivalirudin      . bivalirudin      . Chlorhexidine Gluconate Cloth  6 each Topical Q0600  . clopidogrel      . clopidogrel  75 mg Oral Q breakfast  . colchicine  0.6 mg Oral Daily  . fentaNYL      . fentaNYL      . heparin      . heparin      . lidocaine      . metoprolol      . metoprolol      . metoprolol      . metoprolol  5 mg Intravenous Once  . metoprolol tartrate  25 mg Oral BID  . midazolam      . midazolam      .  morphine injection  2 mg Intravenous Once  . morphine      . morphine      . mupirocin ointment  1 application Nasal BID  . nitroGLYCERIN      . ondansetron      . ondansetron      . ondansetron (ZOFRAN) IV  4 mg Intravenous Once  . potassium chloride  40 mEq Oral Once  . DISCONTD: aspirin EC  81 mg Oral Daily  . DISCONTD: heparin  4,000 Units Intravenous Once  . DISCONTD: heparin  5,000 Units Intravenous Once  . DISCONTD:   morphine injection  4 mg Intravenous Once  . DISCONTD: nitroGLYCERIN       Physical Exam: General appearance: alert, cooperative, no distress and moderately obese Lungs: clear to auscultation bilaterally Heart: regular rate and rhythm Rt groin without hematoma No edema   Rate: 70  Rhythm: Paced at 70, run of paced tachycardia last night, symptomatic, lasted , broke after IV Lopressor.  Lab Results: Results for orders placed during the hospital encounter of 12/30/11 (from the past 24 hour(s))  CBC     Status: Abnormal   Collection Time   12/30/11 10:25 AM      Component Value Range   WBC 16.9 (*) 4.0 - 10.5 (K/uL)   RBC 5.66  4.22 - 5.81 (MIL/uL)   Hemoglobin  18.3 (*) 13.0 - 17.0 (g/dL)   HCT 16.1  09.6 - 04.5 (%)   MCV 89.9  78.0 - 100.0 (fL)   MCH 32.3  26.0 - 34.0 (pg)   MCHC 36.0  30.0 - 36.0 (g/dL)   RDW 40.9  81.1 - 91.4 (%)   Platelets 280  150 - 400 (K/uL)  POCT I-STAT TROPONIN I     Status: Normal   Collection Time   12/30/11 10:38 AM      Component Value Range   Troponin i, poc 0.01  0.00 - 0.08 (ng/mL)   Comment 3           POCT I-STAT, CHEM 8     Status: Abnormal   Collection Time   12/30/11 10:40 AM      Component Value Range   Sodium 142  135 - 145 (mEq/L)   Potassium 3.2 (*) 3.5 - 5.1 (mEq/L)   Chloride 106  96 - 112 (mEq/L)   BUN 5 (*) 6 - 23 (mg/dL)   Creatinine, Ser 7.82  0.50 - 1.35 (mg/dL)   Glucose, Bld 956 (*) 70 - 99 (mg/dL)   Calcium, Ion 2.13  0.86 - 1.32 (mmol/L)   TCO2 23  0 - 100 (mmol/L)   Hemoglobin 18.4 (*) 13.0 - 17.0 (g/dL)   HCT 57.8 (*) 46.9 - 52.0 (%)  POCT ACTIVATED CLOTTING TIME     Status: Normal   Collection Time   12/30/11 11:42 AM      Component Value Range   Activated Clotting Time 815    MRSA PCR SCREENING     Status: Abnormal   Collection Time   12/30/11  1:54 PM      Component Value Range   MRSA by PCR POSITIVE (*) NEGATIVE   CARDIAC PANEL(CRET KIN+CKTOT+MB+TROPI)     Status: Abnormal   Collection Time   12/30/11   2:12 PM      Component Value Range   Total CK 1136 (*) 7 - 232 (U/L)   CK, MB 97.1 (*) 0.3 - 4.0 (ng/mL)   Troponin I 24.32 (*) <0.30 (ng/mL)   Relative Index 8.5 (*) 0.0 - 2.5   PROTIME-INR     Status: Abnormal   Collection Time   12/30/11  2:13 PM      Component Value Range   Prothrombin Time 59.9 (*) 11.6 - 15.2 (seconds)   INR 6.80 (*) 0.00 - 1.49   CBC     Status: Abnormal   Collection Time   12/30/11  2:13 PM      Component Value Range   WBC 16.4 (*) 4.0 - 10.5 (K/uL)   RBC 4.69  4.22 - 5.81 (MIL/uL)   Hemoglobin 14.8  13.0 - 17.0 (g/dL)   HCT 62.9  52.8 - 41.3 (%)   MCV 89.3  78.0 - 100.0 (fL)   MCH 31.6  26.0 - 34.0 (pg)   MCHC 35.3  30.0 - 36.0 (g/dL)   RDW 24.4  01.0 - 27.2 (%)   Platelets 232  150 - 400 (K/uL)  DIFFERENTIAL     Status: Abnormal   Collection Time   12/30/11  2:13 PM      Component Value Range   Neutrophils Relative 91 (*) 43 - 77 (%)   Neutro Abs 14.9 (*) 1.7 - 7.7 (K/uL)   Lymphocytes Relative 4 (*) 12 - 46 (%)   Lymphs Abs 0.7  0.7 - 4.0 (K/uL)   Monocytes Relative 5  3 - 12 (%)  Monocytes Absolute 0.8  0.1 - 1.0 (K/uL)   Eosinophils Relative 0  0 - 5 (%)   Eosinophils Absolute 0.0  0.0 - 0.7 (K/uL)   Basophils Relative 0  0 - 1 (%)   Basophils Absolute 0.0  0.0 - 0.1 (K/uL)  TSH     Status: Normal   Collection Time   12/30/11  2:13 PM      Component Value Range   TSH 0.366  0.350 - 4.500 (uIU/mL)  COMPREHENSIVE METABOLIC PANEL     Status: Abnormal   Collection Time   12/30/11  2:13 PM      Component Value Range   Sodium 138  135 - 145 (mEq/L)   Potassium 4.2  3.5 - 5.1 (mEq/L)   Chloride 106  96 - 112 (mEq/L)   CO2 22  19 - 32 (mEq/L)   Glucose, Bld 139 (*) 70 - 99 (mg/dL)   BUN 7  6 - 23 (mg/dL)   Creatinine, Ser 1.61  0.50 - 1.35 (mg/dL)   Calcium 8.3 (*) 8.4 - 10.5 (mg/dL)   Total Protein 6.5  6.0 - 8.3 (g/dL)   Albumin 3.1 (*) 3.5 - 5.2 (g/dL)   AST 85 (*) 0 - 37 (U/L)   ALT 32  0 - 53 (U/L)   Alkaline Phosphatase 38 (*) 39 -  117 (U/L)   Total Bilirubin 0.4  0.3 - 1.2 (mg/dL)   GFR calc non Af Amer >90  >90 (mL/min)   GFR calc Af Amer >90  >90 (mL/min)  MAGNESIUM     Status: Normal   Collection Time   12/30/11  2:13 PM      Component Value Range   Magnesium 2.0  1.5 - 2.5 (mg/dL)  HEMOGLOBIN W9U     Status: Normal   Collection Time   12/30/11  2:13 PM      Component Value Range   Hemoglobin A1C 5.6  <5.7 (%)   Mean Plasma Glucose 114  <117 (mg/dL)  PROTIME-INR     Status: Abnormal   Collection Time   12/30/11  4:13 PM      Component Value Range   Prothrombin Time 21.7 (*) 11.6 - 15.2 (seconds)   INR 1.85 (*) 0.00 - 1.49   CARDIAC PANEL(CRET KIN+CKTOT+MB+TROPI)     Status: Abnormal   Collection Time   12/30/11  7:55 PM      Component Value Range   Total CK 2171 (*) 7 - 232 (U/L)   CK, MB 204.3 (*) 0.3 - 4.0 (ng/mL)   Troponin I >25.00 (*) <0.30 (ng/mL)   Relative Index 9.4 (*) 0.0 - 2.5   PROTIME-INR     Status: Abnormal   Collection Time   12/30/11  7:55 PM      Component Value Range   Prothrombin Time 16.2 (*) 11.6 - 15.2 (seconds)   INR 1.27  0.00 - 1.49   CARDIAC PANEL(CRET KIN+CKTOT+MB+TROPI)     Status: Abnormal   Collection Time   12/31/11  2:06 AM      Component Value Range   Total CK 1732 (*) 7 - 232 (U/L)   CK, MB 150.1 (*) 0.3 - 4.0 (ng/mL)   Troponin I >25.00 (*) <0.30 (ng/mL)   Relative Index 8.7 (*) 0.0 - 2.5   BASIC METABOLIC PANEL     Status: Abnormal   Collection Time   12/31/11  2:06 AM      Component Value Range   Sodium 136  135 -  145 (mEq/L)   Potassium 3.4 (*) 3.5 - 5.1 (mEq/L)   Chloride 102  96 - 112 (mEq/L)   CO2 22  19 - 32 (mEq/L)   Glucose, Bld 124 (*) 70 - 99 (mg/dL)   BUN 6  6 - 23 (mg/dL)   Creatinine, Ser 1.30  0.50 - 1.35 (mg/dL)   Calcium 8.6  8.4 - 86.5 (mg/dL)   GFR calc non Af Amer >90  >90 (mL/min)   GFR calc Af Amer >90  >90 (mL/min)  CBC     Status: Abnormal   Collection Time   12/31/11  2:06 AM      Component Value Range   WBC 10.9 (*) 4.0 - 10.5  (K/uL)   RBC 4.50  4.22 - 5.81 (MIL/uL)   Hemoglobin 14.1  13.0 - 17.0 (g/dL)   HCT 78.4  69.6 - 29.5 (%)   MCV 90.9  78.0 - 100.0 (fL)   MCH 31.3  26.0 - 34.0 (pg)   MCHC 34.5  30.0 - 36.0 (g/dL)   RDW 28.4  13.2 - 44.0 (%)   Platelets 205  150 - 400 (K/uL)     Basename 12/31/11 0206 12/30/11 1413  WBC 10.9* 16.4*  HGB 14.1 14.8  PLT 205 232    Basename 12/31/11 0206 12/30/11 1413  NA 136 138  K 3.4* 4.2  CL 102 106  CO2 22 22  GLUCOSE 124* 139*  BUN 6 7  CREATININE 0.93 0.88    Basename 12/31/11 0206 12/30/11 1955  TROPONINI >25.00* >25.00*  . Hepatic Function Panel  Basename 12/30/11 1413  PROT 6.5  ALBUMIN 3.1*  AST 85*  ALT 32  ALKPHOS 38*  BILITOT 0.4  BILIDIR --  IBILI --   No results found for this basename: CHOL in the last 72 hours  Basename 12/30/11 1955  INR 1.27   Lipid Panel  No results found for this basename: chol, trig, hdl, cholhdl, vldl, ldlcalc    Imaging: Dg Chest Port 1 View  12/30/2011  *RADIOLOGY REPORT*  Clinical Data: Chest pain.  Shortness of breath.  Pacing defibrillator firing today.  PORTABLE CHEST - 1 VIEW 12/30/2011:  Comparison: Two-view chest x-ray 01/24/2010 [facility].  Findings: Suboptimal inspiration due to body habitus which accounts for crowded bronchovascular markings at the bases and accentuates the cardiac silhouette.  Taking this into account, cardiac silhouette enlarged but stable.  Mild diffuse interstitial pulmonary edema.  Small right pleural effusion.  No visible left pleural effusion.  Left subclavian pacing defibrillator unchanged and appears intact.  IMPRESSION: Suboptimal inspiration.  Mild CHF, with stable cardiomegaly and mild diffuse interstitial pulmonary edema.  Small right pleural effusion.  Original Report Authenticated By: Arnell Sieving, M.D.     Cardiac Studies:  Assessment/Plan:   Principal Problem:  *STEMI, LAD DES 12/30/11  Active Problems:  CAD, Hx of prior stents LAD and  CFX 2005 in [place]  PSVT (paroxysmal supraventricular tachycardia)  Ischemic cardiomyopathy, EF 30%  ICD in place, BS, implanted 2007, one discharge am of STEMI  Obesity  Plan- KCL this am. Consider changing Nitrate and Amio to po. Up to chair. Add Ace-I when B/P.    Corine Shelter PA-C 12/31/2011, 7:08 AM    Patient seen and examined. Agree with assessment and plan. No further chest s/p large Ant MI yesterday with PCI of total Prox LAD. PK CK 2171 with MS 204 with reperfusion.  Will continue with oral nitrates, start oral amiodarone 400 tid, aggressive  statin rx. Increaase lipitor to 80 mg. Echo in am. F/U BNP. Advised against dipping his tobacco. Add ACE-I.  Lennette Bihari, MD, [facility] 12/31/2011 9:27 AM

## 2011-12-31 NOTE — Progress Notes (Signed)
Pt has been using smokeless tobacco during the night. PA and RN strongley enc pt not to use. He has no desire nor intent to stop using tobacco. MD made aware.

## 2012-01-01 ENCOUNTER — Encounter (HOSPITAL_COMMUNITY): Payer: Self-pay | Admitting: *Deleted

## 2012-01-01 DIAGNOSIS — R079 Chest pain, unspecified: Secondary | ICD-10-CM

## 2012-01-01 HISTORY — DX: Chest pain, unspecified: R07.9

## 2012-01-01 LAB — BASIC METABOLIC PANEL
BUN: 5 mg/dL — ABNORMAL LOW (ref 6–23)
GFR calc Af Amer: >90 mL/min (ref >90)
GFR calc non Af Amer: 86 mL/min — ABNORMAL LOW (ref >90)
Potassium: 3.9 mEq/L (ref 3.5–5.1)

## 2012-01-01 MED ORDER — FENTANYL 25 MCG/HR TD PT72
25.0000 ug | MEDICATED_PATCH | TRANSDERMAL | Status: DC
  Administered 2012-01-01: 25 ug via TRANSDERMAL
  Filled 2012-01-01: qty 1

## 2012-01-01 MED ORDER — POLYETHYLENE GLYCOL 3350 17 G PO PACK
17.0000 g | PACK | Freq: Two times a day (BID) | ORAL | Status: DC
  Administered 2012-01-01 – 2012-01-04 (×7): 17 g via ORAL
  Filled 2012-01-01 (×9): qty 1

## 2012-01-01 MED FILL — Dextrose Inj 5%: INTRAVENOUS | Qty: 50 | Status: AC

## 2012-01-01 NOTE — Progress Notes (Signed)
Pt. Has previously broken ankle w/residual pain and stiffness when mobile. Does not affect ability to ambulate, but cause pain.  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

## 2012-01-01 NOTE — Progress Notes (Signed)
   CARE MANAGEMENT NOTE 01/01/2012  Patient:  Gerald Simpson, Gerald Simpson   Account Number:  [id number]  Date Initiated:  01/01/2012  Documentation initiated by:  GRAVES-BIGELOW,Leslea Vowles  Subjective/Objective Assessment:   Pt admitted with cp. Pt is from out of town and was visiting sister when cp began. Taken urgently for cath. Pt states that he has medicaid and that his sister has his card. She will bring in tomorrow. Pt will stay with sister after d/c.     Action/Plan:   Amiodarone is a preferred medicaiton on the medicaid list.   Anticipated DC Date:  01/03/2012   Anticipated DC Plan:  HOME/SELF CARE      DC Planning Services  CM consult      Choice offered to / List presented to:             Status of service:  Completed, signed off Medicare Important Message given?   (If response is "NO", the following Medicare IM given date fields will be blank) Date Medicare IM given:   Date Additional Medicare IM given:    Discharge Disposition:  HOME/SELF CARE  Per UR Regulation:    Comments:  01-01-12 [id number] Tomi Bamberger, RN,BSN [phone] CM did speak to pt and no other needs assessed by CM at this time.

## 2012-01-01 NOTE — Progress Notes (Signed)
UR Completed. Simmons, Sibley Rolison F [phone]  

## 2012-01-01 NOTE — Progress Notes (Signed)
[facility]  Subjective: No CP or SOB.Marland Kitchen He does have right medial ankle pain and low back pain which are both chronic.  Right ankle was fractured last April.  Objective: Vital signs in last 24 hours: Temp:  [97.1 F (36.2 C)-98.9 F (37.2 C)] 97.3 F (36.3 C) (02/25 0700) Pulse Rate:  [60-71] 61  (02/24 1200) Resp:  [12-19] 13  (02/25 0700) BP: (99-131)/(54-95) 120/75 mmHg (02/25 0700) SpO2:  [94 %-100 %] 99 % (02/25 0700) Weight:  [131 kg (288 lb 12.8 oz)] 131 kg (288 lb 12.8 oz) (02/25 0555) Last BM Date: 12/31/11  Intake/Output from previous day: 02/24 0701 - 02/25 0700 In: 1426 [P.O.:960; I.V.:466] Out: 2700 [Urine:2700] Intake/Output this shift:    Medications Current Facility-Administered Medications  Medication Dose Route Frequency Provider Last Rate Last Dose  . acetaminophen (TYLENOL) tablet 650 mg  650 mg Oral Q4H PRN Lennette Bihari, MD      . ALPRAZolam Prudy Feeler) tablet 0.25 mg  0.25 mg Oral TID PRN Abelino Derrick, PA   0.25 mg at 12/31/11 2135  . alum & mag hydroxide-simeth (MAALOX/MYLANTA) 200-200-20 MG/5ML suspension 30 mL  30 mL Oral Q6H PRN Lennette Bihari, MD   30 mL at 12/30/11 1939  . amiodarone (PACERONE) tablet 400 mg  400 mg Oral TID Lennette Bihari, MD   400 mg at 12/31/11 2135  . antiseptic oral rinse (BIOTENE) solution 15 mL  15 mL Mouth Rinse BID Lennette Bihari, MD   15 mL at 12/31/11 2000  . aspirin EC tablet 325 mg  325 mg Oral Daily Lennette Bihari, MD   325 mg at 12/31/11 1012  . atorvastatin (LIPITOR) tablet 80 mg  80 mg Oral q1800 Lennette Bihari, MD   80 mg at 12/31/11 1732  . atropine 1 MG/ML injection           . Chlorhexidine Gluconate Cloth 2 % PADS 6 each  6 each Topical Q0600 Lennette Bihari, MD   6 each at 01/01/12 0535  . clopidogrel (PLAVIX) tablet 75 mg  75 mg Oral Q breakfast Lennette Bihari, MD   75 mg at 01/01/12 0734  . colchicine tablet 0.6 mg  0.6 mg Oral Daily Eda Paschal [place], PA   0.6 mg at 12/31/11 1012  .  isosorbide mononitrate (IMDUR) 24 hr tablet 60 mg  60 mg Oral Daily Eda Paschal [place], [place]   60 mg at 12/31/11 1123  . lisinopril (PRINIVIL,ZESTRIL) tablet 5 mg  5 mg Oral Daily Lennette Bihari, MD   5 mg at 12/31/11 1011  . metoprolol (LOPRESSOR) 1 MG/ML injection           . metoprolol tartrate (LOPRESSOR) tablet 25 mg  25 mg Oral BID Eda Paschal [place], [place]   25 mg at 12/31/11 2135  . morphine 4 MG/ML injection 2 mg  2 mg Intravenous Q1H PRN Lennette Bihari, MD   2 mg at 12/30/11 1418  . mupirocin ointment (BACTROBAN) 2 % 1 application  1 application Nasal BID Lennette Bihari, MD   1 application at 12/31/11 2135  . nitroGLYCERIN (NITROSTAT) SL tablet 0.4 mg  0.4 mg Sublingual Q5 Min x 3 PRN Abelino Derrick, PA      . ondansetron [facility]) injection 4 mg  4 mg Intravenous Q6H PRN Lennette Bihari, MD      . oxyCODONE-acetaminophen (PERCOCET) 5-325 MG per tablet 1-2 tablet  1-2 tablet Oral  Q4H PRN Lennette Bihari, MD   2 tablet at 01/01/12 0734  . zolpidem (AMBIEN) tablet 10 mg  10 mg Oral QHS PRN Abelino Derrick, PA      . DISCONTD: 0.9 %  sodium chloride infusion   Intravenous Continuous Lennette Bihari, MD 75 mL/hr at 12/31/11 0749    . DISCONTD: amiodarone (NEXTERONE PREMIX) 360 mg/200 mL dextrose IV infusion  30 mg/hr Intravenous Continuous Lennette Bihari, MD 16.7 mL/hr at 12/31/11 0600 30.06 mg/hr at 12/31/11 0600  . DISCONTD: atorvastatin (LIPITOR) tablet 40 mg  40 mg Oral q1800 Eda Paschal [place], [place]   40 mg at 12/30/11 1737  . DISCONTD: nitroGLYCERIN 0.2 mg/mL in dextrose 5 % infusion  2-200 mcg/min Intravenous Titrated Lennette Bihari, MD 1.5 mL/hr at 12/31/11 0600 5 mcg/min at 12/31/11 0600    PE: General appearance: alert, cooperative and no distress Lungs: clear to auscultation bilaterally Heart: regular rate and rhythm, S1, S2 normal, no murmur, click, rub or gallop Extremities: No LEE. Pulses: 2+ and symmetric  Lab Results:   Basename 12/31/11 0206 12/30/11 1413 12/30/11 1040 12/30/11 1025  WBC 10.9*  16.4* -- 16.9*  HGB 14.1 14.8 18.4* --  HCT 40.9 41.9 54.0* --  PLT 205 232 -- 280   BMET  Basename 01/01/12 0630 12/31/11 0206 12/30/11 1413  NA 139 136 138  K 3.9 3.4* 4.2  CL 103 102 106  CO2 27 22 22   GLUCOSE 94 124* 139*  BUN 5* 6 7  CREATININE 1.04 0.93 0.88  CALCIUM 9.2 8.6 8.3*   PT/INR  Basename 12/30/11 1955 12/30/11 1613 12/30/11 1413  LABPROT 16.2* 21.7* 59.9*  INR 1.27 1.85* 6.80*   BNP 1176.0  Cholesterol No results found for this basename: CHOL in the last 72 hours Cardiac Enzymes Trop >25.00    Assessment/Plan  Principal Problem:  *STEMI, LAD DES 12/30/11 Active Problems:  CAD, Hx of prior stents LAD and CFX 2005 in [place]  ICD in place, BS, implanted 2007, one discharge am of STEMI  Obesity  PSVT (paroxysmal supraventricular tachycardia)  Ischemic cardiomyopathy, EF 30%  Plan:  A pacing. 1st degree AV block.  Inverted T's.  BP HR stable. BP 99/54 - 131/80.    ASA/ Amio PO/ Statin/ Plavix/ Imdur/ Lopressor.    2D echo pending.   Will check lipids.  Transfer to Stepdown      LOS: 2 days    HAGER,BRYAN W 01/01/2012 8:11 AM  Feels much better. Tolerating Amiodorone load without further VT.  Tolerating initiation of ACE-I.  Will move to [facility].  Cardiac rehab. ECG shows evolving anterior MI changes. For echo today.   Patient seen and examined. Agree with assessment and plan.   Lennette Bihari, MD, [facility] 01/01/2012 8:43 AM

## 2012-01-01 NOTE — Plan of Care (Signed)
Problem: Consults Goal: MI Patient Education (See Patient Education module for education specifics.)  Outcome: Progressing Cardiac rehab has visited today Goal: Tobacco Cessation referral if indicated Outcome: Progressing Counselor saw pt. briefly  Problem: Phase I Progression Outcomes Goal: Initial discharge plan identified Outcome: Completed/Met Date Met:  01/01/12 Return home

## 2012-01-01 NOTE — Consult Note (Signed)
Pt is heavily addicted to nicotine and is dipping a whole can of snuff daily. Discussed risk factors and encouraged to quit. Pt verbalizes understanding and says even though he doesn't want to he will try b/c he knows of the negative health effects. In the middle of discussing phamacotherapy options with the pt he told me that he's in too much pain and also tht he didn't understand the pharma aspect of it and that I needed to talk to his sister about it. Pt given education and contact f/u info and his sister will call me per pt.

## 2012-01-01 NOTE — Progress Notes (Signed)
CARDIAC REHAB PHASE I   PRE:  Rate/Rhythm: 72SR  BP:  Supine:   Sitting: 139/84  Standing:    SaO2:   MODE:  Ambulation: 350 ft   POST:  Rate/Rhythem: 83SR  BP:  Supine:   Sitting: 141/82  Standing:    SaO2:  1020- 1100 Pt walked 350 ft with asst x 1 with very slow pace. C/o back and ankle pain but wanted to walk as much as he could. C/o right  Leg numbness during walk. To recliner after walk. Stated he was trying to stay calm and not get anxious. Emotional support given. Reviewed MI restrictions, stent, and NTG use.  Gerald Simpson

## 2012-01-01 NOTE — Progress Notes (Signed)
*  PRELIMINARY RESULTS* Echocardiogram 2D Echocardiogram has been performed.  Glean Salen [facility] 01/01/2012, 10:58 AM

## 2012-01-02 LAB — BASIC METABOLIC PANEL
Calcium: 9.1 mg/dL (ref 8.4–10.5)
Chloride: 101 mEq/L (ref 96–112)
Creatinine, Ser: 1.07 mg/dL (ref 0.50–1.35)
GFR calc Af Amer: >90 mL/min (ref >90)
GFR calc non Af Amer: 83 mL/min — ABNORMAL LOW (ref >90)

## 2012-01-02 LAB — LIPID PANEL
Cholesterol: 156 mg/dL (ref 0–200)
Total CHOL/HDL Ratio: 5.8 RATIO

## 2012-01-02 LAB — CARDIAC PANEL(CRET KIN+CKTOT+MB+TROPI)
CK, MB: 8.6 ng/mL (ref 0.3–4.0)
Relative Index: 3.4 — ABNORMAL HIGH (ref 0.0–2.5)
Troponin I: 12.71 ng/mL (ref <0.30)

## 2012-01-02 MED ORDER — POTASSIUM CHLORIDE CRYS ER 20 MEQ PO TBCR
40.0000 meq | EXTENDED_RELEASE_TABLET | Freq: Once | ORAL | Status: AC
  Administered 2012-01-02: 40 meq via ORAL

## 2012-01-02 MED ORDER — METHOCARBAMOL 500 MG PO TABS
1000.0000 mg | ORAL_TABLET | Freq: Three times a day (TID) | ORAL | Status: DC
  Administered 2012-01-02 – 2012-01-03 (×6): 1000 mg via ORAL
  Filled 2012-01-02 (×9): qty 2

## 2012-01-02 MED ORDER — POTASSIUM CHLORIDE CRYS ER 20 MEQ PO TBCR
EXTENDED_RELEASE_TABLET | ORAL | Status: AC
  Administered 2012-01-02: 40 meq via ORAL
  Filled 2012-01-02: qty 2

## 2012-01-02 MED ORDER — FUROSEMIDE 20 MG PO TABS
20.0000 mg | ORAL_TABLET | Freq: Every day | ORAL | Status: DC
  Administered 2012-01-02 – 2012-01-04 (×3): 20 mg via ORAL
  Filled 2012-01-02 (×3): qty 1

## 2012-01-02 MED FILL — Perflutren Lipid Microsphere IV Susp 6.52 MG/ML: INTRAVENOUS | Qty: 2 | Status: AC

## 2012-01-02 NOTE — Progress Notes (Signed)
[id number] Cardiac Rehab Completed discharge education with pt. Discussed heart healthy diet and walking as tolerated. He voices understanding. Pt declines Outpt. CRP, physically unable to do it. He walked independently in hall this am, encouraged him to do more later today, Cardiac Rehab goals met, will sign off.

## 2012-01-02 NOTE — Progress Notes (Signed)
Pt. Ambulates in hall independently, but gets toward end of walk and cannot return to room without one person assist or use of walker.  His L leg begins to hurt and gets weak with ambulation. Pt. Walks with a limp, favoring R leg most times. Standing and sitting require extra time and is painful to him.  MD is aware of mobility issues.

## 2012-01-02 NOTE — Progress Notes (Signed)
[facility]  Subjective: Back pain a little better.  No CP, SOB.  States he had some anxiety last night.  Objective: Vital signs in last 24 hours: Temp:  [97.4 F (36.3 C)-98.7 F (37.1 C)] 98.7 F (37.1 C) (02/26 0727) Resp:  [12-19] 15  (02/26 0727) BP: (111-140)/(50-82) 128/68 mmHg (02/26 0727) SpO2:  [95 %-99 %] 98 % (02/26 0727) Last BM Date: 01/01/12  Intake/Output from previous day: 02/25 0701 - 02/26 0700 In: 1038 [P.O.:1038] Out: 1700 [Urine:1700] Intake/Output this shift:    Medications Current Facility-Administered Medications  Medication Dose Route Frequency Provider Last Rate Last Dose  . acetaminophen (TYLENOL) tablet 650 mg  650 mg Oral Q4H PRN Lennette Bihari, MD      . ALPRAZolam Prudy Feeler) tablet 0.25 mg  0.25 mg Oral TID PRN Abelino Derrick, PA   0.25 mg at 01/01/12 2109  . alum & mag hydroxide-simeth (MAALOX/MYLANTA) 200-200-20 MG/5ML suspension 30 mL  30 mL Oral Q6H PRN Lennette Bihari, MD   30 mL at 12/30/11 1939  . amiodarone (PACERONE) tablet 400 mg  400 mg Oral TID Lennette Bihari, MD   400 mg at 01/01/12 2109  . antiseptic oral rinse (BIOTENE) solution 15 mL  15 mL Mouth Rinse BID Lennette Bihari, MD   15 mL at 01/01/12 2000  . aspirin EC tablet 325 mg  325 mg Oral Daily Lennette Bihari, MD   325 mg at 01/01/12 1014  . atorvastatin (LIPITOR) tablet 80 mg  80 mg Oral q1800 Lennette Bihari, MD   80 mg at 01/01/12 1740  . Chlorhexidine Gluconate Cloth 2 % PADS 6 each  6 each Topical Q0600 Lennette Bihari, MD   6 each at 01/02/12 [phone]  . clopidogrel (PLAVIX) tablet 75 mg  75 mg Oral Q breakfast Lennette Bihari, MD   75 mg at 01/02/12 0726  . colchicine tablet 0.6 mg  0.6 mg Oral Daily Eda Paschal [place], [place]   0.6 mg at 01/01/12 0945  . fentaNYL (DURAGESIC - dosed mcg/hr) patch 25 mcg  25 mcg Transdermal Q72H Dwana Melena, PA   25 mcg at 01/01/12 1048  . isosorbide mononitrate (IMDUR) 24 hr tablet 60 mg  60 mg Oral Daily Eda Paschal [place], [place]   60  mg at 01/01/12 0944  . lisinopril (PRINIVIL,ZESTRIL) tablet 5 mg  5 mg Oral Daily Lennette Bihari, MD   5 mg at 01/01/12 0944  . metoprolol tartrate (LOPRESSOR) tablet 25 mg  25 mg Oral BID Eda Paschal [place], [place]   25 mg at 01/01/12 2111  . mupirocin ointment (BACTROBAN) 2 % 1 application  1 application Nasal BID Lennette Bihari, MD   1 application at 01/01/12 2109  . nitroGLYCERIN (NITROSTAT) SL tablet 0.4 mg  0.4 mg Sublingual Q5 Min x 3 PRN Abelino Derrick, PA      . ondansetron [facility]) injection 4 mg  4 mg Intravenous Q6H PRN Lennette Bihari, MD      . oxyCODONE-acetaminophen (PERCOCET) 5-325 MG per tablet 1-2 tablet  1-2 tablet Oral Q4H PRN Lennette Bihari, MD   2 tablet at 01/02/12 0734  . polyethylene glycol (MIRALAX / GLYCOLAX) packet 17 g  17 g Oral BID Dwana Melena, PA   17 g at 01/01/12 2108  . zolpidem (AMBIEN) tablet 10 mg  10 mg Oral QHS PRN Abelino Derrick, PA      . DISCONTD: morphine  4 MG/ML injection 2 mg  2 mg Intravenous Q1H PRN Lennette Bihari, MD   2 mg at 12/30/11 1418    PE: General appearance: alert, cooperative and mild distress Lungs: clear to auscultation bilaterally Heart: regular rate and rhythm, S1, S2 normal, no murmur, click, rub or gallop Extremities: 1+ LEE Pulses: 2+ and symmetric  Lab Results:   Basename 12/31/11 0206 12/30/11 1413 12/30/11 1040 12/30/11 1025  WBC 10.9* 16.4* -- 16.9*  HGB 14.1 14.8 18.4* --  HCT 40.9 41.9 54.0* --  PLT 205 232 -- 280   BMET  Basename 01/02/12 0622 01/01/12 0630 12/31/11 0206  NA 137 139 136  K 3.3* 3.9 3.4*  CL 101 103 102  CO2 26 27 22   GLUCOSE 104* 94 124*  BUN 6 5* 6  CREATININE 1.07 1.04 0.93  CALCIUM 9.1 9.2 8.6   PT/INR  Basename 12/30/11 1955 12/30/11 1613 12/30/11 1413  LABPROT 16.2* 21.7* 59.9*  INR 1.27 1.85* 6.80*   Cholesterol  Basename 01/02/12 0622  CHOL 156   Cardiac Enzymes No components found with this basename: TROPONIN:3, CKMB:3  Echo LV EF: 40% LV EF: 35% -  40% ------------------------------------------------------------ Indications: Chest pain 786.51. ------------------------------------------------------------ History: Risk factors: Hypertension. - Prior Anterior MI with new Ant STEMI ------------------------------------------------------------ Study Conclusions - Left ventricle: The cavity size was moderately dilated. Systolic function was moderately reduced. The estimated ejection fraction was approximately 40%. There is severe hypokinesis of the distalanterior and apical myocardium. Doppler parameters are consistent with abnormal left ventricular relaxation (grade 1 diastolic dysfunction). - Left atrium: The atrium was mildly dilated. - Right ventricle: Systolic pressure was increased. - Atrial septum: No defect or patent foramen ovale was identified. - Tricuspid valve: Mild regurgitation. - Pulmonary arteries: PA peak pressure: 42mm Hg (S). - Pericardium, extracardiac: A trivial pericardial effusion was identified. Impressions:  - The right ventricular systolic pressure was increased consistent with moderate pulmonary hypertension.   Assessment/Plan    Principal Problem:  *STEMI, LAD DES 12/30/11 Active Problems:  CAD, Hx of prior stents LAD and CFX 2005 in [place]  ICD in place, BS, implanted 2007, one discharge am of STEMI  Obesity  PSVT (paroxysmal supraventricular tachycardia)  Ischemic cardiomyopathy, EF 30%  EF 35-40% by Echo   Plan:  BP stable-111/64 - 140/50. HR 60's.  A pacing.  K+ a touch low.  Will check enzyme trend.  Added robaxin for back.  May need to increase fentanyl.  EF 35-40%.  Grade one diastolic.  Will add lasix 20mg  daily.  If enzymes trending down, will move to tele.     LOS: 3 days    HAGER,BRYAN W 01/02/2012 7:55 AM  I have seen and examined the patient along with Wilburt Finlay, PA.  I have reviewed the chart, notes and new data.  I agree with Bryan's note.  Key new complaints: No further CP or  SOB, Back & ankle pain a bit better Key examination changes: Lungs clear, no gallop, no edema Key new findings / data: Echo reviewed, K 3.3 (may need to put on standing replacement dose with addition of Lasix, Cr stable; WBC continues to improve (likely due to MI related inflammation)  PLAN:  Continue Amiodarone PO load - not sure if he will need to be on this long term - may be able to stop as OP Continue ASA/Plavix along with Statin, BB & ACE-I for ICM Agree with standing Lasix dose as BNP was elevated. Will transfer to Telemetry pending dropping Troponins.  Using Fentanyl & prn Percocet for chronic back pain Need to ambulate Needs CM / CSW consult -- is on disability in [place], may need help if he will remain in [place].  Marykay Lex, M.D., M.S. [facility] [address] [place], [place]  [address]  [phone]  01/02/2012 8:30 AM

## 2012-01-03 LAB — BASIC METABOLIC PANEL
CO2: 28 mEq/L (ref 19–32)
Chloride: 101 mEq/L (ref 96–112)
Creatinine, Ser: 0.99 mg/dL (ref 0.50–1.35)

## 2012-01-03 MED ORDER — AMIODARONE HCL 200 MG PO TABS
400.0000 mg | ORAL_TABLET | Freq: Every day | ORAL | Status: DC
  Administered 2012-01-03 – 2012-01-04 (×2): 400 mg via ORAL
  Filled 2012-01-03 (×2): qty 2

## 2012-01-03 MED ORDER — LISINOPRIL 10 MG PO TABS
10.0000 mg | ORAL_TABLET | Freq: Every day | ORAL | Status: DC
  Administered 2012-01-03 – 2012-01-04 (×2): 10 mg via ORAL
  Filled 2012-01-03 (×2): qty 1

## 2012-01-03 MED ORDER — NIACIN ER (ANTIHYPERLIPIDEMIC) 500 MG PO TBCR
500.0000 mg | EXTENDED_RELEASE_TABLET | Freq: Every day | ORAL | Status: DC
  Administered 2012-01-03: 500 mg via ORAL
  Filled 2012-01-03 (×2): qty 1

## 2012-01-03 NOTE — Progress Notes (Signed)
[facility] Progress Note  Subjective:  No further chest pain; Day 4 s/p AMI.  Weak.  Back Pain with walking.  Objective:   Vital Signs in the last 24 hours: Temp:  [97.4 F (36.3 C)-98 F (36.7 C)] 98 F (36.7 C) (02/27 0355) Pulse Rate:  [59-68] 59  (02/27 0355) Resp:  [18-20] 18  (02/27 0355) BP: (115-130)/(67-85) 130/85 mmHg (02/27 0355) SpO2:  [96 %-98 %] 97 % (02/27 0355)  Intake/Output from previous day: 02/26 0701 - 02/27 0700 In: 318 [P.O.:318] Out: -   Scheduled:   . amiodarone  400 mg Oral TID  . antiseptic oral rinse  15 mL Mouth Rinse BID  . aspirin EC  325 mg Oral Daily  . atorvastatin  80 mg Oral q1800  . Chlorhexidine Gluconate Cloth  6 each Topical Q0600  . clopidogrel  75 mg Oral Q breakfast  . colchicine  0.6 mg Oral Daily  . fentaNYL  25 mcg Transdermal Q72H  . furosemide  20 mg Oral Daily  . isosorbide mononitrate  60 mg Oral Daily  . lisinopril  5 mg Oral Daily  . methocarbamol  1,000 mg Oral TID  . metoprolol tartrate  25 mg Oral BID  . mupirocin ointment  1 application Nasal BID  . polyethylene glycol  17 g Oral BID  . potassium chloride  40 mEq Oral Once    Physical Exam:   General appearance: alert, cooperative and no distress Neck: no JVD Lungs: clear to auscultation bilaterally Heart: S1, S2 normal Abdomen: soft, non-tender; bowel sounds normal; no masses,  no organomegaly Extremities: trivial ankle swelling Pulses: 2+ and symmetric   Rate: 60  Rhythm: sinus, occasional A pacing  Lab Results:    Basename 01/03/12 0620 01/02/12 0622  NA 138 137  K 3.9 3.3*  CL 101 101  CO2 28 26  GLUCOSE 95 104*  BUN 6 6  CREATININE 0.99 1.07    Basename 01/02/12 0816  TROPONINI 12.71*   Hepatic Function Panel No results found for this basename: PROT,ALBUMIN,AST,ALT,ALKPHOS,BILITOT,BILIDIR,IBILI in the last 72 hours No results found for this basename: INR in the last 72 hours  Lipid Panel       Component Value Date/Time   CHOL 156 01/02/2012 0622   TRIG 253* 01/02/2012 0622   HDL 27* 01/02/2012 0622   CHOLHDL 5.8 01/02/2012 0622   VLDL 51* 01/02/2012 0622   LDLCALC 78 01/02/2012 0622     Imaging:  No results found.    Assessment/Plan:   Principal Problem:  *STEMI, LAD DES 12/30/11 Active Problems:  CAD, Hx of prior stents LAD and CFX 2005 in [place]  ICD in place, BS, implanted 2007, one discharge am of STEMI  Obesity  PSVT (paroxysmal supraventricular tachycardia)  Ischemic cardiomyopathy, EF 30%  Day 4 s/p Anterior MI RX with successful PCI of occluded LAD.  No further VT.  Will decrease Amiodarone to 400 mg daily. Still weak. Add niaspan 500 mg to high dose statin for atherogenic dyslipidemic profile. Tentatively aim for DC tomorrow.    Lennette Bihari, MD, [facility] 01/03/2012, 8:14 AM

## 2012-01-04 LAB — BASIC METABOLIC PANEL
CO2: 28 mEq/L (ref 19–32)
Calcium: 9.4 mg/dL (ref 8.4–10.5)
Chloride: 102 mEq/L (ref 96–112)
Glucose, Bld: 90 mg/dL (ref 70–99)
Sodium: 138 mEq/L (ref 135–145)

## 2012-01-04 MED ORDER — ALPRAZOLAM 0.25 MG PO TABS: 0.2500 mg | ORAL_TABLET | Freq: Every evening | ORAL | Status: DC | PRN

## 2012-01-04 MED ORDER — SIMVASTATIN 40 MG PO TABS: 40.0000 mg | ORAL_TABLET | Freq: Every evening | ORAL | Status: DC

## 2012-01-04 MED ORDER — FUROSEMIDE 20 MG PO TABS: 20.0000 mg | ORAL_TABLET | Freq: Every day | ORAL | Status: DC

## 2012-01-04 MED ORDER — METOPROLOL TARTRATE 25 MG PO TABS: 25.0000 mg | ORAL_TABLET | Freq: Two times a day (BID) | ORAL | Status: DC

## 2012-01-04 MED ORDER — FENTANYL 25 MCG/HR TD PT72: 1.0000 | MEDICATED_PATCH | TRANSDERMAL | Status: DC

## 2012-01-04 MED ORDER — ATORVASTATIN CALCIUM 80 MG PO TABS: 80.0000 mg | ORAL_TABLET | Freq: Every day | ORAL | Status: DC

## 2012-01-04 MED ORDER — CLOPIDOGREL BISULFATE 75 MG PO TABS: 75.0000 mg | ORAL_TABLET | Freq: Every day | ORAL | Status: DC

## 2012-01-04 MED ORDER — NIACIN ER (ANTIHYPERLIPIDEMIC) 500 MG PO TBCR: 500.0000 mg | EXTENDED_RELEASE_TABLET | Freq: Every day | ORAL | Status: DC

## 2012-01-04 MED ORDER — ACETAMINOPHEN 325 MG PO TABS: 650.0000 mg | ORAL_TABLET | ORAL | Status: DC | PRN

## 2012-01-04 MED ORDER — NITROGLYCERIN 0.4 MG SL SUBL: 0.4000 mg | SUBLINGUAL_TABLET | SUBLINGUAL | Status: DC | PRN

## 2012-01-04 MED ORDER — ASPIRIN 325 MG PO TBEC: 325.0000 mg | DELAYED_RELEASE_TABLET | Freq: Every day | ORAL | Status: DC

## 2012-01-04 MED ORDER — OXYCODONE-ACETAMINOPHEN 5-325 MG PO TABS: 1.0000 | ORAL_TABLET | ORAL | Status: DC | PRN

## 2012-01-04 MED ORDER — LISINOPRIL 10 MG PO TABS: 10.0000 mg | ORAL_TABLET | Freq: Every day | ORAL | Status: DC

## 2012-01-04 MED ORDER — ISOSORBIDE MONONITRATE ER 60 MG PO TB24: 60.0000 mg | ORAL_TABLET | Freq: Every day | ORAL | Status: DC

## 2012-01-04 MED ORDER — METHOCARBAMOL 500 MG PO TABS: 1000.0000 mg | ORAL_TABLET | Freq: Three times a day (TID) | ORAL | Status: DC

## 2012-01-04 MED ORDER — AMIODARONE HCL 400 MG PO TABS: 400.0000 mg | ORAL_TABLET | Freq: Every day | ORAL | Status: DC

## 2012-01-04 NOTE — Progress Notes (Signed)
[facility]  Subjective: Slept better last night than any other night. No SOB or CP.   Objective: Vital signs in last 24 hours: Temp:  [97.9 F (36.6 C)-98.4 F (36.9 C)] 97.9 F (36.6 C) (02/28 0649) Pulse Rate:  [57-61] 61  (02/28 0649) Resp:  [18] 18  (02/28 0649) BP: (109-125)/(55-83) 121/79 mmHg (02/28 0649) SpO2:  [94 %-97 %] 94 % (02/28 0649) Last BM Date: 01/03/12  Intake/Output from previous day: 02/27 0701 - 02/28 0700 In: 480 [P.O.:480] Out: -  Intake/Output this shift:    Medications Current Facility-Administered Medications  Medication Dose Route Frequency Provider Last Rate Last Dose  . acetaminophen (TYLENOL) tablet 650 mg  650 mg Oral Q4H PRN Lennette Bihari, MD      . ALPRAZolam Prudy Feeler) tablet 0.25 mg  0.25 mg Oral TID PRN Abelino Derrick, PA   0.25 mg at 01/04/12 0640  . alum & mag hydroxide-simeth (MAALOX/MYLANTA) 200-200-20 MG/5ML suspension 30 mL  30 mL Oral Q6H PRN Lennette Bihari, MD   30 mL at 12/30/11 1939  . amiodarone (PACERONE) tablet 400 mg  400 mg Oral Daily Lennette Bihari, MD   400 mg at 01/03/12 1038  . antiseptic oral rinse (BIOTENE) solution 15 mL  15 mL Mouth Rinse BID Lennette Bihari, MD   15 mL at 01/04/12 0800  . aspirin EC tablet 325 mg  325 mg Oral Daily Lennette Bihari, MD   325 mg at 01/03/12 1038  . atorvastatin (LIPITOR) tablet 80 mg  80 mg Oral q1800 Lennette Bihari, MD   80 mg at 01/03/12 1658  . Chlorhexidine Gluconate Cloth 2 % PADS 6 each  6 each Topical Q0600 Lennette Bihari, MD   6 each at 01/04/12 [phone]  . clopidogrel (PLAVIX) tablet 75 mg  75 mg Oral Q breakfast Lennette Bihari, MD   75 mg at 01/04/12 0835  . colchicine tablet 0.6 mg  0.6 mg Oral Daily Eda Paschal [place], [place]   0.6 mg at 01/03/12 1039  . fentaNYL (DURAGESIC - dosed mcg/hr) patch 25 mcg  25 mcg Transdermal Q72H Dwana Melena, PA   25 mcg at 01/01/12 1048  . furosemide (LASIX) tablet 20 mg  20 mg Oral Daily Dwana Melena, PA   20 mg at 01/03/12  1038  . isosorbide mononitrate (IMDUR) 24 hr tablet 60 mg  60 mg Oral Daily Eda Paschal [place], [place]   60 mg at 01/02/12 0905  . lisinopril (PRINIVIL,ZESTRIL) tablet 10 mg  10 mg Oral Daily Lennette Bihari, MD   10 mg at 01/03/12 1038  . methocarbamol (ROBAXIN) tablet 1,000 mg  1,000 mg Oral TID Dwana Melena, PA   1,000 mg at 01/03/12 2308  . metoprolol tartrate (LOPRESSOR) tablet 25 mg  25 mg Oral BID Eda Paschal [place], [place]   25 mg at 01/03/12 2308  . mupirocin ointment (BACTROBAN) 2 % 1 application  1 application Nasal BID Lennette Bihari, MD   1 application at 01/03/12 2200  . niacin (NIASPAN) CR tablet 500 mg  500 mg Oral QHS Lennette Bihari, MD   500 mg at 01/03/12 2308  . nitroGLYCERIN (NITROSTAT) SL tablet 0.4 mg  0.4 mg Sublingual Q5 Min x 3 PRN Abelino Derrick, PA      . ondansetron [facility]) injection 4 mg  4 mg Intravenous Q6H PRN Lennette Bihari, MD      . oxyCODONE-acetaminophen [facility])  5-325 MG per tablet 1-2 tablet  1-2 tablet Oral Q4H PRN Lennette Bihari, MD   2 tablet at 01/04/12 [phone]  . polyethylene glycol (MIRALAX / GLYCOLAX) packet 17 g  17 g Oral BID Dwana Melena, PA   17 g at 01/03/12 2314  . zolpidem (AMBIEN) tablet 10 mg  10 mg Oral QHS PRN Abelino Derrick, PA        PE: General appearance: alert, cooperative and no distress Lungs: clear to auscultation bilaterally Heart: regular rate and rhythm, S1, S2 normal, no murmur, click, rub or gallop Extremities: No LEE Pulses: 2+ and symmetric  Lab Results:  No results found for this basename: WBC:3,HGB:3,HCT:3,PLT:3 in the last 72 hours BMET  Basename 01/04/12 0615 01/03/12 0620 01/02/12 0622  NA 138 138 137  K 4.1 3.9 3.3*  CL 102 101 101  CO2 28 28 26   GLUCOSE 90 95 104*  BUN 7 6 6   CREATININE 1.02 0.99 1.07  CALCIUM 9.4 9.5 9.1   PT/INR No results found for this basename: LABPROT:3,INR:3 in the last 72 hours Cholesterol  Basename 01/02/12 0622  CHOL 156      Assessment/Plan  Principal Problem:  *STEMI, LAD DES  12/30/11 Active Problems:  CAD, Hx of prior stents LAD and CFX 2005 in [place]  ICD in place, BS, implanted 2007, one discharge am of STEMI  Obesity  PSVT (paroxysmal supraventricular tachycardia)  Ischemic cardiomyopathy, EF 30%  Plan:  Day 5 s/p Anterior MI RX with successful PCI of occluded LAD. No further VT.  CSW working with patient.  BP   109/55 - 125/83.  DC to sisters house today.  Amio/ASA/ Plavix/Lasix 20mg  daily/Imdur/ACE/BB.   LOS: 5 days    Gerald Simpson W 01/04/2012 9:34 AM

## 2012-01-04 NOTE — Progress Notes (Signed)
Pt discharging with sister who lives in [place] ([place] from [place]).  DC instructions and prescriptions given and reviewed.  F/U appts in process.

## 2012-01-04 NOTE — Progress Notes (Signed)
Pt. Seen and examined. Agree with the NP/PA-C note as written. D/c home today.  Chrystie Nose, MD, [facility] Attending Cardiologist [facility]

## 2012-01-04 NOTE — Progress Notes (Signed)
Patient was found in room with cuts to both thumbs, bleeding. Washed and applied bandaids. Patient states he cut his fingers on his snuff can, patient was asked if the cuts occurred today- at first he said no, but the snuff can was found in the window. Patient was taught on risks of using and hospital policy. Will continue monitoring closely. Please note that patient stated he has an ingrown toenail on the right big toe, swelling noted there. Also he pointed out an open wound on his left elbow, he thinks a spider bit him, applied clear dressing.

## 2012-01-04 NOTE — Progress Notes (Signed)
Clinical Child psychotherapist received referral for assistance with transfer of [place] disability to [place]. Patient has a sister and a daughter who currently live in [place], [place]. Patient's plan is to stay with his sister. CSW spoke with financial counselor Morrie Sheldon regarding this concern and she is already working on it. Per Morrie Sheldon, the patient will have to apply for [facility] Medicaid because we are not contracted with [place]. Morrie Sheldon will be contacting patient today. CSW will sign off as social work intervention is no longer needed. Please consult [place] again if new needs arises.   Rozetta Nunnery MSW, [employer] [phone]

## 2012-01-04 NOTE — Discharge Instructions (Signed)
Myocardial Infarction A myocardial infarction (MI) is damage to the heart that is not reversible. It is also called a heart attack. An MI usually occurs when a heart (coronary) artery becomes blocked or narrowed. This cuts off the blood supply to the heart. When one or more of the heart (coronary) arteries becomes blocked, that area of the heart begins to die. This causes pain felt during an MI.  If you think you might be having an MI, call your local emergency services immediately (911 in [place] It is recommended that you take a 162 mg non-enteric coated aspirin if you do not have an aspirin allergy. Do not drive yourself to the hospital or wait to see if your symptoms go away. The sooner MI is treated, the greater the amount of heart muscle saved. Time is muscle. It can save your life. CAUSES  An MI can occur from:  A gradual buildup of a fatty substance called plaque. When plaque builds up in the arteries, this condition is called atherosclerosis. This buildup can block or reduce the blood supply to the heart artery(s).   A sudden plaque rupture within a heart artery that causes a blood clot (thrombus). A blood clot can block the heart artery which does not allow blood flow to the heart.   A severe tightening (spasm) of the heart artery. This is a less common cause of a heart attack. When a heart artery spasms, it cuts off blood flow through the artery. Spasms can occur in heart arteries that do not have atherosclerosis.  RISK FACTORS People at risk for an MI usually have one or more risk factors, such as:  High blood pressure.   High cholesterol.   Smoking.   Gender. Men have a higher heart attack risk.   Overweight/obesity.   Age.   Family history.   Lack of exercise.   Diabetes.   Stress.   Excessive alcohol use.   Street drug use (cocaine and methamphetamines).  SYMPTOMS  MI symptoms can vary, such as:  In both men and women, MI symptoms can include the following:    Chest pain. The chest pain may feel like a crushing, squeezing, or "pressure" type feeling. MI pain can be "referred," meaning pain can be caused in one part of the body but felt in another part of the body. Referred MI pain may occur in the left arm, neck, or jaw. Pain may even be felt in the right arm.   Shortness of breath (dyspnea).   Heartburn or indigestion with or without vomiting, shortness of breath, or sweating (diaphoresis).   Sudden, cold sweats.   Sudden lightheadedness.   Upper back pain.   Women can have unique MI symptoms, such as:   Unexplained feelings of nervousness or anxiety.   Discomfort between the shoulder blades (scapula) or upper back.   Tingling in the hands and arms.   In elderly people (regardless of gender), MI symptoms can be subtle, such as:   Sweating (diaphoresis).   Shortness of breath (dyspnea).   General tiredness (fatigue) or not feeling well (malaise).  DIAGNOSIS  Diagnosis of an MI involves several tests such as:  An assessment of your vital signs such as heart rhythm, blood pressure, respiratory rate, and oxygen level.   An EKG (ECG) to look at the electrical activity of your heart.   Blood tests called cardiac markers are drawn at scheduled times to measure proteins or enzymes released by the damaged heart muscle.   A chest   X-ray.   An echocardiogram to evaluate heart motion and blood flow.   Coronary angiography (cardiac catheterization). This is a diagnostic procedure to look at the heart arteries.  TREATMENT  Acute Intervention. For an MI, the national standard in the [place] is to have an acute intervention in under 90 minutes from the time you get to the hospital. An acute intervention is a special procedure to open up the heart arteries. It is done in a treatment room called a "catheterization lab" (cath lab). Some hospitals do no have a cath lab. If you are having an MI and the hospital does not have a cath lab, the  standard is to transport you to a hospital that has one. In the cath lab, acute intervention includes:  Angioplasty. An angioplasty involves inserting a thin, flexible tube (catheter) into an artery in either your groin or wrist. The catheter is threaded to the heart arteries. A balloon at the end of the catheter is inflated to open a narrowed or blocked heart artery. During an angioplasty procedure, a small mesh tube (stent) may be used to keep the heart artery open. Depending on your condition and health history, one of two types of stents may be placed:   Drug-eluting stent (DES). A DES is coated with a medicine to prevent scar tissue from growing over the stent. With drug-eluting stents, blood thinning medicine will need to be taken for up to a year.   Bare metal stent. This type of stent has no special coating to keep tissue from growing over it. This type of stent is used if you cannot take blood thinning medicine for a prolonged time or you need surgery in the near future. After a bare metal stent is placed, blood thinning medicine will need to be taken for about a month.   If you are taking blood thinning medicine (anti-platelet therapy) after stent placement, do not stop taking it unless your caregiver says it is okay to do so. Make sure you understand how long you need to take the medicine.  Surgical Intervention  If an acute intervention is not successful, surgery may be needed:   Open heart surgery (coronary artery bypass graft, CABG). CABG takes a vein (saphenous vein) from your leg. The vein is then attached to the blocked heart artery which bypasses the blockage. This then allows blood flow to the heart muscle.  Additional Interventions  A "clot buster" medicine (thrombolytic) may be given. This medicine can help break up a clot in the heart artery. This medicine may be given if a person cannot get to a cath lab right away.   Intra-aortic balloon pump (IABP). If you have suffered a  very severe MI and are too unstable to go to the cath lab or to surgery, an IABP may be used. This is a temporary mechanical device used to increase blood flow to the heart and reduce the workload of the heart until you are stable enough to go to the cath lab or surgery.  HOME CARE INSTRUCTIONS After an MI, you may need the following:  Medication. Take medication as directed by your caregiver. Medications after an MI may:   Keep your blood from clotting easily (blood thinners).   Control your blood pressure.   Help lower your cholesterol.   Control abnormal heart rhythms.   Lifestyle changes. Under the guidance of your caregiver, lifestyle changes include:   Quitting smoking, if you smoke. Your caregiver can help you quit.   Being   physically active.   Maintaining a healthy weight.   Eating a heart healthy diet. A dietician can help you learn healthy eating options.   Managing diabetes.   Reducing stress.   Limiting alcohol intake.  SEEK IMMEDIATE MEDICAL CARE IF:   You have severe chest pain, especially if the pain is crushing or pressure-like and spreads to the arms, back, neck, or jaw. This is an emergency. Do not wait to see if the pain will go away. Get medical help at once. Call your local emergency services (911 in the [place] Do not drive yourself to the hospital.   You have shortness of breath during rest, sleep, or with activity.   You have sudden sweating or clammy skin.   You feel sick to your stomach (nauseous) and throw up (vomit).   You suddenly become lightheaded or dizzy.   You feel your heart beating rapidly or you notice "skipped" beats.  MAKE SURE YOU:   Understand these instructions.   Will watch your condition.   Will get help right away if you are not doing well or get worse.  Document Released: 10/23/2005 Document Revised: 07/05/2011 Document Reviewed: 03/22/2011 [employer] Patient Information 2012 [employer], [employer]onary Angiography with  Stent This is a procedure to widen or open a narrow blood vessel of the heart (coronary artery). When a coronary artery becomes partially blocked it decreases blood flow to that area. This may lead to chest pain or a heart attack (myocardial infarction). Arteries may become blocked by cholesterol buildup (plaque) in the lining or wall. A stent is a small piece of metal that looks like a mesh or a spring. Stent placement may be done right after an angiogram that finds a blocked artery or as a treatment for a heart attack. RISKS AND COMPLICATIONS  Damage to the heart.   A blockage may return.   Bleeding at the site.   Blood clot to another part of the body.  PROCEDURE  You may be given a medication to help you relax before and during the procedure through an IV in your hand or arm.   A local anesthetic to make the area numb may be used before inserting the catheter (a long, hollow tube about the size of a piece of cooked spaghetti).   You will be prepared for the procedure by washing and shaving the area where the catheter will be inserted. This is usually done in the groin.   A specially trained doctor will insert the catheter with a guide wire into an artery. This is guided under a special type of X-ray (fluoroscopy) to the opening of the blocked artery.   Special dye is then injected and X-rays are taken.   A tiny wire is guided to the blocked spot and a balloon is inflated to make the artery wider. The stent is expanded and crushes the plaque into the wall of the vessel. The stent holds the area open like a scaffolding and improves the blood flow.   Sometimes the artery may be made wider using a laser or other tools to remove plaque.   When the blood flow is better, the catheter is removed. The lining of the artery will grow over the stent which stays where it was placed.  AFTER THE PROCEDURE  You will stay in bed for several hours.   The access site will be watched and you will be  checked frequently.   Blood tests, X-rays and an EKG may be done.   You  may stay in the hospital overnight for observation.  SEEK IMMEDIATE MEDICAL CARE IF:   You develop chest pain, shortness of breath, feel faint, or pass out.   There is bleeding, swelling, or drainage from the catheter insertion site.   You develop pain, discoloration, coldness, or severe bruising in the leg or arm that held the catheter.   You see blood in your urine or stool. This may be bright red blood in urine or stools, or also appear as black, tarry stools.   You have a fever.  Document Released: 04/29/2003 Document Revised: 07/05/2011 Document Reviewed: 12/20/2007 [facility] Patient Information 2012 [place], [place].C.

## 2012-01-04 NOTE — Discharge Summary (Signed)
Patient ID: Gerald Simpson,  MRN: [id number], DOB/AGE: 1966-03-13 46 y.o.  Admit date: 12/30/2011 Discharge date: 01/04/2012  Primary Care Provider: None Primary Cardiologist: Dr Tresa Endo  Discharge Diagnoses  Principal Problem:  *STEMI, LAD DES 12/30/11  Active Problems:  CAD, Hx of prior stents LAD and CFX 2005 in [place]  PSVT (paroxysmal supraventricular tachycardia)  Ischemic cardiomyopathy, EF 30%  ICD in place, BS, implanted 2007, one discharge am of STEMI  Obesity  chronic back pain    Procedures: Urgent Cath/LAD DES 12/30/11   Hospital Course:  The patient is a 46 year old male with a history of coronary disease. He apparently had stents placed to the LAD and CFX in 2005 in [place]. It appears he was here in March of 2011 and had his defibrillator checked. It is a [employer] that was implanted 2007. The patient  recently arrived in [place] to visit his sister. On the morning of admission he developed substernal chest pain. He says his defibrillator went off once. EMS was called around 10 AM. His initial EKG did not show any acute ST elevation. In the emergency room he continued to have chest pain and another EKG was done showing inferior and anterior ST elevation. Code STEMI was activated. The patient was taken urgently to the cath lab by Dr Tresa Endo. Cath showed an occluded LAD which was treated with a DES. He did have CK peak of approximately 2100k. EF 40% by echo. He was watched closely in the CCU, he did have some transient tachycardia. He is discharged on Amiodarone. He has chronic back pain and pain medications were prescribed at discharge till he can find a primary MD,( he now wants to move to this area). We feel he can be discharged 01/04/12.  Discharge Vitals:  Blood pressure 121/79, pulse 61, temperature 97.9 F (36.6 C), temperature source Oral, resp. rate 18, height 5\' 10"  (1.778 m), weight 131 kg (288 lb 12.8 oz), SpO2 94.00%.    Labs: Results for orders  placed during the hospital encounter of 12/30/11 (from the past 48 hour(s))  BASIC METABOLIC PANEL     Status: Normal   Collection Time   01/03/12  6:20 AM      Component Value Range Comment   Sodium 138  135 - 145 (mEq/L)    Potassium 3.9  3.5 - 5.1 (mEq/L)    Chloride 101  96 - 112 (mEq/L)    CO2 28  19 - 32 (mEq/L)    Glucose, Bld 95  70 - 99 (mg/dL)    BUN 6  6 - 23 (mg/dL)    Creatinine, Ser 2.95  0.50 - 1.35 (mg/dL)    Calcium 9.5  8.4 - 10.5 (mg/dL)    GFR calc non Af Amer >90  >90 (mL/min)    GFR calc Af Amer >90  >90 (mL/min)   BASIC METABOLIC PANEL     Status: Abnormal   Collection Time   01/04/12  6:15 AM      Component Value Range Comment   Sodium 138  135 - 145 (mEq/L)    Potassium 4.1  3.5 - 5.1 (mEq/L)    Chloride 102  96 - 112 (mEq/L)    CO2 28  19 - 32 (mEq/L)    Glucose, Bld 90  70 - 99 (mg/dL)    BUN 7  6 - 23 (mg/dL)    Creatinine, Ser 6.21  0.50 - 1.35 (mg/dL)    Calcium 9.4  8.4 - 10.5 (mg/dL)  GFR calc non Af Amer 88 (*) >90 (mL/min)    GFR calc Af Amer >90  >90 (mL/min)     Disposition:  Follow-up Information    Follow up with Nicki Guadalajara A, MD. (office will call)    Contact information:   [address] [place] [address] [phone]          Discharge Medications:  Medication List  As of 01/04/2012 12:07 PM   STOP taking these medications         enalapril 20 MG tablet         TAKE these medications         acetaminophen 325 MG tablet   Commonly known as: TYLENOL   Take 2 tablets (650 mg total) by mouth every 4 (four) hours as needed.      ALPRAZolam 0.25 MG tablet   Commonly known as: XANAX   Take 1 tablet (0.25 mg total) by mouth at bedtime as needed. For anxiety/sleep.      amiodarone 400 MG tablet   Commonly known as: PACERONE   Take 1 tablet (400 mg total) by mouth daily.      aspirin 325 MG EC tablet   Take 1 tablet (325 mg total) by mouth daily.      clopidogrel 75 MG tablet   Commonly  known as: PLAVIX   Take 1 tablet (75 mg total) by mouth daily with breakfast.      colchicine 0.6 MG tablet   Take 0.6 mg by mouth daily.      furosemide 20 MG tablet   Commonly known as: LASIX   Take 1 tablet (20 mg total) by mouth daily.      isosorbide mononitrate 60 MG 24 hr tablet   Commonly known as: IMDUR   Take 1 tablet (60 mg total) by mouth daily.      lisinopril 10 MG tablet   Commonly known as: PRINIVIL,ZESTRIL   Take 1 tablet (10 mg total) by mouth daily.      methocarbamol 500 MG tablet   Commonly known as: ROBAXIN   Take 2 tablets (1,000 mg total) by mouth 3 (three) times daily.      metoprolol tartrate 25 MG tablet   Commonly known as: LOPRESSOR   Take 1 tablet (25 mg total) by mouth 2 (two) times daily.      niacin 500 MG CR tablet   Commonly known as: NIASPAN   Take 1 tablet (500 mg total) by mouth at bedtime.      nitroGLYCERIN 0.4 MG SL tablet   Commonly known as: NITROSTAT   Place 1 tablet (0.4 mg total) under the tongue every 5 (five) minutes x 3 doses as needed for chest pain.      oxyCODONE-acetaminophen 5-325 MG per tablet   Commonly known as: PERCOCET   Take 1-2 tablets by mouth every 4 (four) hours as needed.      simvastatin 40 MG tablet   Commonly known as: ZOCOR   Take 1 tablet (40 mg total) by mouth every evening.            Outstanding Labs/Studies  Duration of Discharge Encounter: Greater than 30 minutes including physician time.  Jolene Provost PA-C 01/04/2012 12:07 PM

## 2012-01-04 NOTE — Progress Notes (Signed)
   CARE MANAGEMENT NOTE 01/04/2012  Patient:  Gerald Simpson, Gerald Simpson   Account Number:  [id number]  Date Initiated:  01/01/2012  Documentation initiated by:  GRAVES-BIGELOW,Jeffrie Lofstrom  Subjective/Objective Assessment:   Pt admitted with cp. Pt is from out of town and was visiting sister when cp began. Taken urgently for cath. Pt states that he has medicaid and that his sister has his card. She will bring in tomorrow. Pt will stay with sister after d/c.     Action/Plan:   Amiodarone is a preferred medicaiton on the medicaid list.   Anticipated DC Date:  01/03/2012   Anticipated DC Plan:  HOME/SELF CARE      DC Planning Services  CM consult      Choice offered to / List presented to:             Status of service:  Completed, signed off Medicare Important Message given?   (If response is "NO", the following Medicare IM given date fields will be blank) Date Medicare IM given:   Date Additional Medicare IM given:    Discharge Disposition:  HOME/SELF CARE  Per UR Regulation:    Comments:  01-04-12 1252 Richardo Priest, RN,BSN [phone] CM did discuss with pt that he will need to apply for [place]icaid if he was going to stay here in [place]. He has an appointment for medicaid on Thursday at 12:00. Pt will be d/c on all generics. Pt stated that he had to go to [place] and he will get rx for 50 cents. No other needs assessed by CM at this time.   01-01-12 [address] RN,BSN [phone] CM did speak to pt and no other needs assessed by CM at this time.

## 2012-01-09 ENCOUNTER — Encounter (HOSPITAL_COMMUNITY): Payer: Self-pay | Admitting: Emergency Medicine

## 2012-01-09 ENCOUNTER — Emergency Department (HOSPITAL_COMMUNITY)
Admission: EM | Admit: 2012-01-09 | Discharge: 2012-01-10 | Disposition: A | Discharge status: Home / Self Care | Payer: Medicaid Other | Attending: Emergency Medicine | Admitting: Emergency Medicine

## 2012-01-09 ENCOUNTER — Other Ambulatory Visit: Payer: Self-pay

## 2012-01-09 DIAGNOSIS — R0602 Shortness of breath: Secondary | ICD-10-CM | POA: Insufficient documentation

## 2012-01-09 DIAGNOSIS — I2510 Atherosclerotic heart disease of native coronary artery without angina pectoris: Secondary | ICD-10-CM | POA: Insufficient documentation

## 2012-01-09 DIAGNOSIS — M545 Low back pain, unspecified: Secondary | ICD-10-CM | POA: Insufficient documentation

## 2012-01-09 DIAGNOSIS — I252 Old myocardial infarction: Secondary | ICD-10-CM | POA: Insufficient documentation

## 2012-01-09 DIAGNOSIS — G8929 Other chronic pain: Secondary | ICD-10-CM | POA: Insufficient documentation

## 2012-01-09 DIAGNOSIS — I10 Essential (primary) hypertension: Secondary | ICD-10-CM | POA: Insufficient documentation

## 2012-01-09 DIAGNOSIS — Z9861 Coronary angioplasty status: Secondary | ICD-10-CM | POA: Insufficient documentation

## 2012-01-09 LAB — BASIC METABOLIC PANEL
BUN: 10 mg/dL (ref 6–23)
CO2: 26 mEq/L (ref 19–32)
Calcium: 10.6 mg/dL — ABNORMAL HIGH (ref 8.4–10.5)
Creatinine, Ser: 1.07 mg/dL (ref 0.50–1.35)
Glucose, Bld: 102 mg/dL — ABNORMAL HIGH (ref 70–99)

## 2012-01-09 LAB — CBC
HCT: 46.6 % (ref 39.0–52.0)
Hemoglobin: 17 g/dL (ref 13.0–17.0)
MCH: 33.1 pg (ref 26.0–34.0)
MCV: 90.7 fL (ref 78.0–100.0)
Platelets: 325 10*3/uL (ref 150–400)
RBC: 5.14 MIL/uL (ref 4.22–5.81)

## 2012-01-09 LAB — POCT I-STAT TROPONIN I

## 2012-01-09 MED ORDER — HYDROMORPHONE HCL PF 2 MG/ML IJ SOLN
2.0000 mg | Freq: Once | INTRAMUSCULAR | Status: AC
  Administered 2012-01-09: 2 mg via INTRAVENOUS
  Filled 2012-01-09: qty 1

## 2012-01-09 NOTE — ED Notes (Signed)
Pt c/o shortness of breath, onset earlier today when his lower back started to hurt.  Pt st's he has chronic back pain and pain is radiating down both legs  Pt st's he is out of his percocet.  Pt also has hx of MI on 2/23.

## 2012-01-09 NOTE — ED Notes (Signed)
Patient with shortness of breath that started earlier today.  Patient recently discharged from [facility] for MI in last week.  Breath sounds diminished on the left base.  Patient breathing easy.

## 2012-01-10 MED ORDER — OXYCODONE-ACETAMINOPHEN 5-325 MG PO TABS: 1.0000 | ORAL_TABLET | ORAL | Status: AC | PRN

## 2012-01-10 NOTE — ED Provider Notes (Signed)
History     CSN: [id number]  Arrival date & time 01/09/12  2106   First MD Initiated Contact with Patient 01/09/12 2229      Chief Complaint  Patient presents with  . Shortness of Breath     Patient is a 46 y.o. male presenting with shortness of breath. The history is provided by the patient.  Shortness of Breath  Associated symptoms include shortness of breath.   the patient may recently relocated to [place] from [place].  He has a long-standing history of chronic back pain.  He also has a strong history of cardiac disease.  He reports many years ago when he had his first MI he fell off a roof as he has syncopal episode and he injured his back.  He reports he's had chronic low back pain since then.  He used to be on chronic pain medicine and [place] but currently is out of all of his pain medications.  He was seen and admitted the hospital for ST elevation MI approximately 2 weeks ago and had a new stent placed.  He denies chest pain shortness of breath at this time.  He reports his chest feels the best it has in years.  He reports pain in his left low back radiating to his left but not which began several days ago.  He was discharged home with a prescription for Percocet from the cardiologist but reports she ran out of those yesterday.  He denies weakness of his lower extremities.  He he's had no changes in his bowel or bladder habits.  He denies weight loss.  He has no history of IV drug abuse.  He has no history of cancer.  His pain is moderate to severe at this time.  Nothing worsens his pain except for movement and palpation.  Nothing improves his pain.  Past Medical History  Diagnosis Date  . Myocardial infarction   . Hypertension   . Coronary artery disease     Past Surgical History  Procedure Date  . Coronary angioplasty   . Tonsillectomy   . Eye surgery   . Hernia repair   . Insert / replace / remove pacemaker     No family history on file.  History    Substance Use Topics  . Smoking status: Never Smoker   . Smokeless tobacco: Current User    Types: Chew  . Alcohol Use: No      Review of Systems  Respiratory: Positive for shortness of breath.   All other systems reviewed and are negative.    Allergies  Morphine and related  Home Medications   Current Outpatient Rx  Name Route Sig Dispense Refill  . ACETAMINOPHEN 325 MG PO TABS Oral Take 650 mg by mouth every 4 (four) hours as needed. For pain    . ALPRAZOLAM 0.25 MG PO TABS Oral Take 0.25 mg by mouth at bedtime as needed. For anxiety/sleep.    . ASPIRIN 325 MG PO TBEC Oral Take 325 mg by mouth daily.    Marland Kitchen CLOPIDOGREL BISULFATE 75 MG PO TABS Oral Take 75 mg by mouth daily with breakfast.    . FUROSEMIDE 20 MG PO TABS Oral Take 20 mg by mouth daily.    Marland Kitchen LISINOPRIL 10 MG PO TABS Oral Take 10 mg by mouth daily.    Marland Kitchen METOPROLOL TARTRATE 25 MG PO TABS Oral Take 25 mg by mouth 2 (two) times daily.    Marland Kitchen NITROGLYCERIN 0.4 MG SL  SUBL Sublingual Place 0.4 mg under the tongue every 5 (five) minutes x 3 doses as needed. For chest pain    . OXYCODONE-ACETAMINOPHEN 5-325 MG PO TABS Oral Take 1-2 tablets by mouth every 4 (four) hours as needed. For pain    . SIMVASTATIN 40 MG PO TABS Oral Take 40 mg by mouth every evening.    . OXYCODONE-ACETAMINOPHEN 5-325 MG PO TABS Oral Take 1 tablet by mouth every 4 (four) hours as needed for pain. 35 tablet 0    BP 125/91  Pulse 77  Temp(Src) 98.1 F (36.7 C) (Oral)  Resp 14  SpO2 100%  Physical Exam  Nursing note and vitals reviewed. Constitutional: He is oriented to person, place, and time. He appears well-developed and well-nourished.  HENT:  Head: Normocephalic and atraumatic.  Eyes: EOM are normal.  Neck: Normal range of motion.  Cardiovascular: Normal rate, regular rhythm, normal heart sounds and intact distal pulses.   Pulmonary/Chest: Effort normal and breath sounds normal. No respiratory distress.  Abdominal: Soft. He exhibits no  distension. There is no tenderness.  Musculoskeletal: Normal range of motion.       Normal strength in his bilateral lower extremities.  Pain to his paralumbar spinal region and pain in his left sciatic notch  Neurological: He is alert and oriented to person, place, and time.  Skin: Skin is warm and dry.  Psychiatric: He has a normal mood and affect. Judgment normal.    ED Course  Procedures (including critical care time)   Date: 01/10/2012  Rate: 93  Rhythm: normal sinus rhythm  QRS Axis: left  Intervals: normal  ST/T Wave abnormalities: Nonspecific ST and T wave changes  Conduction Disutrbances: none  Narrative Interpretation:   Old EKG Reviewed: No significant changes noted     Labs Reviewed  CBC - Abnormal; Notable for the following:    MCHC 36.5 (*)    All other components within normal limits  BASIC METABOLIC PANEL - Abnormal; Notable for the following:    Glucose, Bld 102 (*)    Calcium 10.6 (*)    GFR calc non Af Amer 83 (*)    All other components within normal limits  PRO B NATRIURETIC PEPTIDE - Abnormal; Notable for the following:    Pro B Natriuretic peptide (BNP) 478.1 (*)    All other components within normal limits  POCT I-STAT TROPONIN I   No results found.   1. Low back pain   2. Chronic back pain       MDM  The patient's pain is significantly improved at the lives here in the ER.  His pain is in his left back it appears consistent with his chronic low back pain.  He's been chronic narcotics but is new to the area and has no narcotic medications.  His EKG is unchanged she has no chest pain shortness of breath at this time.  He did recently have PCI for ST elevation MI.  He has no anginal symptoms at this time.  DC home in good condition.  I will reck the patient prescription for pain medicine.  I encouraged the patient strongly to develop a relationship with a primary care Dr. for his ongoing chronic pain needs        Lyanne Co,  MD 01/10/12 [phone]

## 2012-01-17 ENCOUNTER — Other Ambulatory Visit: Payer: Self-pay

## 2012-01-17 ENCOUNTER — Emergency Department (HOSPITAL_COMMUNITY)
Admission: EM | Admit: 2012-01-17 | Discharge: 2012-01-17 | Disposition: A | Discharge status: Home Health | Payer: Medicaid Other | Attending: Emergency Medicine | Admitting: Emergency Medicine

## 2012-01-17 ENCOUNTER — Encounter (HOSPITAL_COMMUNITY): Payer: Self-pay | Admitting: Emergency Medicine

## 2012-01-17 DIAGNOSIS — M545 Low back pain, unspecified: Secondary | ICD-10-CM | POA: Insufficient documentation

## 2012-01-17 DIAGNOSIS — I10 Essential (primary) hypertension: Secondary | ICD-10-CM | POA: Insufficient documentation

## 2012-01-17 DIAGNOSIS — G8929 Other chronic pain: Secondary | ICD-10-CM

## 2012-01-17 DIAGNOSIS — I252 Old myocardial infarction: Secondary | ICD-10-CM | POA: Insufficient documentation

## 2012-01-17 DIAGNOSIS — R269 Unspecified abnormalities of gait and mobility: Secondary | ICD-10-CM | POA: Insufficient documentation

## 2012-01-17 DIAGNOSIS — M79609 Pain in unspecified limb: Secondary | ICD-10-CM | POA: Insufficient documentation

## 2012-01-17 DIAGNOSIS — I2510 Atherosclerotic heart disease of native coronary artery without angina pectoris: Secondary | ICD-10-CM | POA: Insufficient documentation

## 2012-01-17 DIAGNOSIS — Z79899 Other long term (current) drug therapy: Secondary | ICD-10-CM | POA: Insufficient documentation

## 2012-01-17 MED ORDER — HYDROMORPHONE HCL PF 1 MG/ML IJ SOLN
1.0000 mg | Freq: Once | INTRAMUSCULAR | Status: AC
  Administered 2012-01-17: 1 mg via INTRAMUSCULAR
  Filled 2012-01-17: qty 1

## 2012-01-17 MED ORDER — KETOROLAC TROMETHAMINE 60 MG/2ML IM SOLN
60.0000 mg | Freq: Once | INTRAMUSCULAR | Status: AC
  Administered 2012-01-17: 60 mg via INTRAMUSCULAR
  Filled 2012-01-17: qty 2

## 2012-01-17 MED ORDER — OXYCODONE-ACETAMINOPHEN 5-325 MG PO TABS: 1.0000 | ORAL_TABLET | Freq: Four times a day (QID) | ORAL | Status: DC | PRN

## 2012-01-17 NOTE — ED Notes (Signed)
States chronic lower back pain radiating to left lower extremity. Pain 10/10 achy sharp shooting states pain severe intermittent hold his breath.  History of 3  MI, 5 -6 stents, pacemaker/difibrillator.  States burning chest pain 1/10 from anxiety.  Ax4.

## 2012-01-17 NOTE — ED Provider Notes (Signed)
History     CSN: [id number]  Arrival date & time 01/17/12  4098   First MD Initiated Contact with Patient 01/17/12 2130      Chief Complaint  Patient presents with  . Back Pain    (Consider location/radiation/quality/duration/timing/severity/associated sxs/prior treatment) HPI Comments: Patient with a history of recent heart attack, congestive heart failure, pacemaker placement and chronic back pain presents emergency Department with a chief complaint of back pain.  Note that the triage note indicates that the patient has associated shortness of breath and burning chest pain however the patient repeats multiple times that he in fact is not having any chest pain or shortness of breath and he is only here in the emergency department for his back pain.  Patient denies any recent trauma or injury to his back and states that it has just been gradually worsening over the last couple of days.  Describes the pain as sharp 10 out of 10 in his lower lumbar region and that it radiates down his left leg.  Patient states he's having difficulty sleeping.  Patient states also he was seen recently in emergency department and given 35 Percocet however he has used all these Percocet to control his pain.  Because patient is from [place] he is not currently established in [place] and has a Medicaid appointment next week.  Patient denies loss control of his bowel or bladder and extremity weakness.  Patient is a 46 y.o. male presenting with back pain. The history is provided by the patient.  Back Pain  Pertinent negatives include no chest pain, no fever, no numbness, no headaches, no abdominal pain and no weakness.    Past Medical History  Diagnosis Date  . Myocardial infarction   . Hypertension   . Coronary artery disease     Past Surgical History  Procedure Date  . Coronary angioplasty   . Tonsillectomy   . Eye surgery   . Hernia repair   . Insert / replace / remove pacemaker     No family  history on file.  History  Substance Use Topics  . Smoking status: Never Smoker   . Smokeless tobacco: Current User    Types: Chew  . Alcohol Use: No      Review of Systems  Constitutional: Positive for activity change. Negative for fever, chills, fatigue and unexpected weight change.  HENT: Negative for neck pain and neck stiffness.   Eyes: Negative for visual disturbance.  Respiratory: Negative for shortness of breath.   Cardiovascular: Negative for chest pain and leg swelling.  Gastrointestinal: Negative for nausea, abdominal pain, constipation and rectal pain.  Genitourinary: Negative for urgency and difficulty urinating.       Patient denies bowel and bladder incontinence.  Musculoskeletal: Positive for back pain and gait problem. Negative for myalgias, joint swelling and arthralgias.  Neurological: Negative for weakness, numbness and headaches.  All other systems reviewed and are negative.    Allergies  Morphine and related  Home Medications   Current Outpatient Rx  Name Route Sig Dispense Refill  . ACETAMINOPHEN 325 MG PO TABS Oral Take 650 mg by mouth every 4 (four) hours as needed. For pain    . ALPRAZOLAM 0.25 MG PO TABS Oral Take 0.25 mg by mouth at bedtime as needed. For anxiety/sleep.    Marland Kitchen CLOPIDOGREL BISULFATE 75 MG PO TABS Oral Take 75 mg by mouth daily with breakfast.    . FUROSEMIDE 20 MG PO TABS Oral Take 20 mg by mouth  daily.    Marland Kitchen LISINOPRIL 10 MG PO TABS Oral Take 10 mg by mouth daily.    Marland Kitchen METOPROLOL TARTRATE 25 MG PO TABS Oral Take 25 mg by mouth 2 (two) times daily.    Marland Kitchen NITROGLYCERIN 0.4 MG SL SUBL Sublingual Place 0.4 mg under the tongue every 5 (five) minutes x 3 doses as needed. For chest pain    . OXYCODONE-ACETAMINOPHEN 5-325 MG PO TABS Oral Take 1 tablet by mouth every 4 (four) hours as needed for pain. 35 tablet 0  . SIMVASTATIN 40 MG PO TABS Oral Take 40 mg by mouth every evening.      BP 136/87  Pulse 74  Temp(Src) 97.7 F (36.5 C)  (Oral)  Resp 22  SpO2 99%  Physical Exam  Nursing note and vitals reviewed. Constitutional: He is oriented to person, place, and time. He appears well-developed and well-nourished. No distress.  HENT:  Head: Normocephalic and atraumatic.  Eyes: Conjunctivae and EOM are normal. Pupils are equal, round, and reactive to light. No scleral icterus.  Neck: Normal range of motion and full passive range of motion without pain. Neck supple. No spinous process tenderness and no muscular tenderness present. No rigidity. Normal range of motion present. No Brudzinski's sign noted.  Cardiovascular: Normal rate, regular rhythm and intact distal pulses.  Exam reveals no gallop and no friction rub.   No murmur heard. Pulmonary/Chest: Effort normal and breath sounds normal. No respiratory distress. He has no wheezes. He has no rales. He exhibits no tenderness.  Musculoskeletal:       Cervical back: He exhibits normal range of motion, no tenderness, no bony tenderness and no pain.       Thoracic back: He exhibits no tenderness, no bony tenderness and no pain.       Lumbar back: He exhibits tenderness, bony tenderness and pain. He exhibits no spasm and normal pulse.       Arms:      Right foot: He exhibits no swelling.       Left foot: He exhibits no swelling.       Bilateral lower extremities nontender without color change, baseline range of motion of extremities with intact distal pulses, capillary refill less than 2 seconds bilaterally.  Pt has increased pain w ROM of lumbar spine. Pain w ambulation, no sign of ataxia.  Neurological: He is alert and oriented to person, place, and time. He has normal strength and normal reflexes. No sensory deficit. Gait (no ataxia, slowed and hunched d/t pain ) abnormal.       sensation at baseline for light touch in all 4 distal extremities, motor symmetric & bilateral 5/5  Abduction,adduction,flexion of hips, knee flexion & extension, foot dorsiflexion, foot plantar flexion.   Skin: Skin is warm and dry. No rash noted. He is not diaphoretic. No erythema. No pallor.  Psychiatric: He has a normal mood and affect.    ED Course  Procedures (including critical care time)  Labs Reviewed - No data to display No results found.   No diagnosis found.    MDM  Chronic back pain  Patient with back pain.  No neurological deficits and normal neuro exam.  Patient can walk but states is painful.  No loss of bowel or bladder control.  No concern for cauda equina.  No fever, night sweats, weight loss, h/o cancer, IVDU.  RICE protocol and pain medicine indicated and discussed with patient.  Patient presentation on concerning for chest pain or shortness of  breath and have asked patient repetitively if the symptoms are present because they need to be evaluated.  Patient states he is asymptomatic other than the chronic back pain and that he has a followup appointment with his cardiologist next week.  Note that chronic pain management was discussed with the patient and he was told we do not typically treat chronic pain in the emergency department.  Patient will be given again another prescription for Percocet however he is been advised to seek primary care to follow his chronic pain.          Jaci Carrel, [place] 01/17/12 2246

## 2012-01-17 NOTE — ED Notes (Addendum)
Patient to room PDA 3 and sitting in chair. Patient refuses to put on gown or get in bed. Encouraged patient to do so in order for EDP may thoroughly evaluate. Patient still does not want to comply; states that his "back hurts" and he's "just having an anxiety attack."

## 2012-01-17 NOTE — Discharge Instructions (Signed)
Followup with orthopedics if symptoms continue. Use conservative methods at home including heat therapy and cold therapy as we discussed. More information on cold therapy is listed below.  It is not reccommended to use heat treatment directly after an acute injury.  SEEK IMMEDIATE MEDICAL ATTENTION IF: New numbness, tingling, weakness, or problem with the use of your arms or legs.  Severe back pain not relieved with medications.  Change in bowel or bladder control.  Increasing pain in any areas of the body (such as chest or abdominal pain).  Shortness of breath, dizziness or fainting.  Nausea (feeling sick to your stomach), vomiting, fever, or sweats.  COLD THERAPY DIRECTIONS:  Ice or gel packs can be used to reduce both pain and swelling. Ice is the most helpful within the first 24 to 48 hours after an injury or flareup from overusing a muscle or joint.  Ice is effective, has very few side effects, and is safe for most people to use.   If you expose your skin to cold temperatures for too long or without the proper protection, you can damage your skin or nerves. Watch for signs of skin damage due to cold.   HOME CARE INSTRUCTIONS  Follow these tips to use ice and cold packs safely.  Place a dry or damp towel between the ice and skin. A damp towel will cool the skin more quickly, so you may need to shorten the time that the ice is used.  For a more rapid response, add gentle compression to the ice.  Ice for no more than 10 to 20 minutes at a time. The bonier the area you are icing, the less time it will take to get the benefits of ice.  Check your skin after 5 minutes to make sure there are no signs of a poor response to cold or skin damage.  Rest 20 minutes or more in between uses.  Once your skin is numb, you can end your treatment. You can test numbness by very lightly touching your skin. The touch should be so light that you do not see the skin dimple from the pressure of your fingertip.  When using ice, most people will feel these normal sensations in this order: cold, burning, aching, and numbness.  Do not use ice on someone who cannot communicate their responses to pain, such as small children or people with dementia.   HOW TO MAKE AN ICE PACK  To make an ice pack, do one of the following:  Place crushed ice or a bag of frozen vegetables in a sealable plastic bag. Squeeze out the excess air. Place this bag inside another plastic bag. Slide the bag into a pillowcase or place a damp towel between your skin and the bag.  Mix 3 parts water with 1 part rubbing alcohol. Freeze the mixture in a sealable plastic bag. When you remove the mixture from the freezer, it will be slushy. Squeeze out the excess air. Place this bag inside another plastic bag. Slide the bag into a pillowcase or place a damp towel between your skin and the bag.   SEEK MEDICAL CARE IF:  You develop white spots on your skin. This may give the skin a blotchy (mottled) appearance.  Your skin turns blue or pale.  Your skin becomes waxy or hard.  Your swelling gets worse.  MAKE SURE YOU:  Understand these instructions.  Will watch your condition.  Will get help right away if you are not doing well or   get worse.    Chronic Pain Discharge Instructions  Emergency care providers appreciate that many patients coming to us are in severe pain and we wish to address their pain in the safest, most responsible manner.  It is important to recognize however, that the proper treatment of chronic pain differs from that of the pain of injuries and acute illnesses.  Our goal is to provide quality, safe, personalized care and we thank you for giving us the opportunity to serve you. The use of narcotics and related agents for chronic pain syndromes may lead to additional physical and psychological problems.  Nearly as many people die from prescription narcotics each year as die from car crashes.  Additionally, this risk is increased if  such prescriptions are obtained from a variety of sources.  Therefore, only your primary care physician or a pain management specialist is able to safely treat such syndromes with narcotic medications long-term.    Documentation revealing such prescriptions have been sought from multiple sources may prohibit us from providing a refill or different narcotic medication.  Your name may be checked first through the [place] Controlled Substances Reporting System.  This database is a record of controlled substance medication prescriptions that the patient has received.  This has been established by [facility] in an effort to eliminate the dangerous, and often life threatening, practice of obtaining multiple prescriptions from different medical providers.   If you have a chronic pain syndrome (i.e. chronic headaches, recurrent back or neck pain, dental pain, abdominal or pelvis pain without a specific diagnosis, or neuropathic pain such as fibromyalgia) or recurrent visits for the same condition without an acute diagnosis, you may be treated with non-narcotics and other non-addictive medicines.  Allergic reactions or negative side effects that may be reported by a patient to such medications will not typically lead to the use of a narcotic analgesic or other controlled substance as an alternative.   Patients managing chronic pain with a personal physician should have provisions in place for breakthrough pain.  If you are in crisis, you should call your physician.  If your physician directs you to the emergency department, please have the doctor call and speak to our attending physician concerning your care.   When patients come to the Emergency Department (ED) with acute medical conditions in which the Emergency Department physician feels appropriate to prescribe narcotic or sedating pain medication, the physician will prescribe these in very limited quantities.  The amount of these medications will last  only until you can see your primary care physician in his/her office.  Any patient who returns to the ED seeking refills should expect only non-narcotic pain medications.   In the event of an acute medical condition exists and the emergency physician feels it is necessary that the patient be given a narcotic or sedating medication -  a responsible adult driver should be present in the room prior to the medication being given by the nurse.   Prescriptions for narcotic or sedating medications that have been lost, stolen or expired will not be refilled in the Emergency Department.    Patients who have chronic pain may receive non-narcotic prescriptions until seen by their primary care physician.  It is every patient's personal responsibility to maintain active prescriptions with his or her primary care physician or specialist.     

## 2012-01-18 NOTE — ED Provider Notes (Signed)
Medical screening examination/treatment/procedure(s) were performed by non-physician practitioner and as supervising physician I was immediately available for consultation/collaboration.   Nat Christen, MD 01/18/12 [phone]

## 2012-01-26 ENCOUNTER — Emergency Department (HOSPITAL_COMMUNITY)
Admission: EM | Admit: 2012-01-26 | Discharge: 2012-01-26 | Disposition: A | Discharge status: Home / Self Care | Payer: Medicaid Other | Attending: Emergency Medicine | Admitting: Emergency Medicine

## 2012-01-26 ENCOUNTER — Emergency Department (HOSPITAL_COMMUNITY): Payer: Medicaid Other

## 2012-01-26 ENCOUNTER — Encounter (HOSPITAL_COMMUNITY): Payer: Self-pay | Admitting: Emergency Medicine

## 2012-01-26 ENCOUNTER — Other Ambulatory Visit: Payer: Self-pay

## 2012-01-26 DIAGNOSIS — I2510 Atherosclerotic heart disease of native coronary artery without angina pectoris: Secondary | ICD-10-CM | POA: Diagnosis present

## 2012-01-26 DIAGNOSIS — M549 Dorsalgia, unspecified: Secondary | ICD-10-CM | POA: Insufficient documentation

## 2012-01-26 DIAGNOSIS — M79609 Pain in unspecified limb: Secondary | ICD-10-CM | POA: Insufficient documentation

## 2012-01-26 DIAGNOSIS — G8929 Other chronic pain: Secondary | ICD-10-CM | POA: Diagnosis present

## 2012-01-26 DIAGNOSIS — R51 Headache: Secondary | ICD-10-CM | POA: Insufficient documentation

## 2012-01-26 DIAGNOSIS — I471 Supraventricular tachycardia, unspecified: Secondary | ICD-10-CM | POA: Diagnosis present

## 2012-01-26 DIAGNOSIS — C000 Malignant neoplasm of external upper lip: Secondary | ICD-10-CM | POA: Insufficient documentation

## 2012-01-26 DIAGNOSIS — R079 Chest pain, unspecified: Secondary | ICD-10-CM | POA: Diagnosis present

## 2012-01-26 DIAGNOSIS — I213 ST elevation (STEMI) myocardial infarction of unspecified site: Secondary | ICD-10-CM | POA: Diagnosis present

## 2012-01-26 DIAGNOSIS — E669 Obesity, unspecified: Secondary | ICD-10-CM | POA: Diagnosis present

## 2012-01-26 DIAGNOSIS — Z9861 Coronary angioplasty status: Secondary | ICD-10-CM | POA: Insufficient documentation

## 2012-01-26 DIAGNOSIS — I255 Ischemic cardiomyopathy: Secondary | ICD-10-CM | POA: Diagnosis present

## 2012-01-26 DIAGNOSIS — I252 Old myocardial infarction: Secondary | ICD-10-CM | POA: Insufficient documentation

## 2012-01-26 DIAGNOSIS — Z95810 Presence of automatic (implantable) cardiac defibrillator: Secondary | ICD-10-CM | POA: Diagnosis present

## 2012-01-26 DIAGNOSIS — I10 Essential (primary) hypertension: Secondary | ICD-10-CM | POA: Insufficient documentation

## 2012-01-26 DIAGNOSIS — R072 Precordial pain: Secondary | ICD-10-CM | POA: Insufficient documentation

## 2012-01-26 HISTORY — DX: Diverticulitis of intestine, part unspecified, without perforation or abscess without bleeding: K57.92

## 2012-01-26 LAB — DIFFERENTIAL
Basophils Absolute: 0.1 10*3/uL (ref 0.0–0.1)
Basophils Relative: 1 % (ref 0–1)
Eosinophils Relative: 6 % — ABNORMAL HIGH (ref 0–5)
Lymphocytes Relative: 34 % (ref 12–46)
Monocytes Absolute: 0.7 10*3/uL (ref 0.1–1.0)

## 2012-01-26 LAB — CBC
MCHC: 34.8 g/dL (ref 30.0–36.0)
MCV: 91.2 fL (ref 78.0–100.0)
Platelets: 228 10*3/uL (ref 150–400)
RDW: 13.1 % (ref 11.5–15.5)
WBC: 7.7 10*3/uL (ref 4.0–10.5)

## 2012-01-26 LAB — POCT I-STAT TROPONIN I
Troponin i, poc: 0.02 ng/mL (ref 0.00–0.08)
Troponin i, poc: 0.01 ng/mL (ref 0.00–0.08)

## 2012-01-26 LAB — BASIC METABOLIC PANEL
CO2: 24 mEq/L (ref 19–32)
Calcium: 9.1 mg/dL (ref 8.4–10.5)
Creatinine, Ser: 1.02 mg/dL (ref 0.50–1.35)
GFR calc Af Amer: >90 mL/min (ref >90)
GFR calc non Af Amer: 88 mL/min — ABNORMAL LOW (ref >90)
Sodium: 139 mEq/L (ref 135–145)

## 2012-01-26 MED ORDER — DIAZEPAM 5 MG/ML IJ SOLN
5.0000 mg | Freq: Once | INTRAMUSCULAR | Status: AC
  Administered 2012-01-26: 5 mg via INTRAVENOUS
  Filled 2012-01-26: qty 2

## 2012-01-26 MED ORDER — KETOROLAC TROMETHAMINE 60 MG/2ML IM SOLN
60.0000 mg | Freq: Once | INTRAMUSCULAR | Status: AC
  Administered 2012-01-26: 60 mg via INTRAMUSCULAR
  Filled 2012-01-26: qty 2

## 2012-01-26 MED ORDER — TRAMADOL HCL 50 MG PO TABS: 50.0000 mg | ORAL_TABLET | Freq: Four times a day (QID) | ORAL | Status: AC | PRN

## 2012-01-26 MED ORDER — OXYCODONE-ACETAMINOPHEN 5-325 MG PO TABS: 1.0000 | ORAL_TABLET | Freq: Four times a day (QID) | ORAL | Status: AC | PRN

## 2012-01-26 MED ORDER — HYDROMORPHONE HCL PF 1 MG/ML IJ SOLN
1.0000 mg | Freq: Once | INTRAMUSCULAR | Status: AC
  Administered 2012-01-26: 1 mg via INTRAVENOUS
  Filled 2012-01-26: qty 1

## 2012-01-26 MED ORDER — ONDANSETRON HCL 4 MG/2ML IJ SOLN
4.0000 mg | Freq: Once | INTRAMUSCULAR | Status: AC
  Administered 2012-01-26: 4 mg via INTRAVENOUS
  Filled 2012-01-26: qty 2

## 2012-01-26 MED ORDER — ACETAMINOPHEN 325 MG PO TABS
650.0000 mg | ORAL_TABLET | Freq: Four times a day (QID) | ORAL | Status: DC | PRN
  Filled 2012-01-26: qty 2

## 2012-01-26 NOTE — ED Notes (Signed)
Paged cardiology. Cardiology is aware of pt and "will be down as soon as someone can"

## 2012-01-26 NOTE — ED Provider Notes (Signed)
History     CSN: [id number]  Arrival date & time 01/26/12  4098   First MD Initiated Contact with Patient 01/26/12 [phone]      Chief Complaint  Patient presents with  . Chest Pain    (Consider location/radiation/quality/duration/timing/severity/associated sxs/prior treatment) HPI  Patient is 46 yo man with PMH of  recent MI, CHF,  HTN, pacemaker inplacement, who presents with chest pain.   Per patient, he started having chest pain since this AM. The pain is located at the right chest, sharp, radiating to his right arm, 8/10 in severity. It is intermittent and happens every 1 to 2 hours. It lasts for about one hour. It is aggravated by crossing arms or laying on either left or right sides. It is alleviated by standing up or doing some activities. Currently his chest pain is minimal.   Patient also reports that he has chronic mild pain under his pacemaker on the left chest ever since his pacemaker was placed about 7 years ago. This chest pain is worsening since 4 days ago. It is 3/10 in severity, dull, radiating to his left arm. It is aggravated by laying on the left side and alleviated by repositioning himself. It is not aggravated by exertion. Currently his pain is minimal.  Patient also reports having substernal heart burning in the past 4 days. He had one episode of right side headache which resolved after arrival to the ED.  Denies fever, chills, cough, SOB,  abdominal pain,diarrhea, constipation, dysuria, urgency, frequency, hematuria. No pain in calf area. No recent long distant travelling.  Past Medical History  Diagnosis Date  . Myocardial infarction   . Hypertension   . Coronary artery disease   . Diverticulitis     Past Surgical History  Procedure Date  . Coronary angioplasty   . Tonsillectomy   . Eye surgery   . Hernia repair   . Insert / replace / remove pacemaker     History reviewed. No pertinent family history.  History  Substance Use Topics  . Smoking  status: Never Smoker   . Smokeless tobacco: Current User    Types: Chew  . Alcohol Use: No    Review of Systems  Constitutional: Negative for fever, chills and diaphoresis.  HENT: Negative for sore throat, facial swelling, rhinorrhea, sneezing, drooling, mouth sores, neck pain, voice change, tinnitus and ear discharge.   Eyes: Negative for photophobia, pain, discharge and visual disturbance.  Respiratory: Negative for apnea, cough, choking, chest tightness, shortness of breath, wheezing and stridor.   Cardiovascular: Positive for chest pain. Negative for palpitations and leg swelling.  Gastrointestinal: Negative for nausea, vomiting, abdominal pain, diarrhea, constipation, blood in stool and abdominal distention.  Genitourinary: Negative for dysuria, urgency, frequency, hematuria and difficulty urinating.  Musculoskeletal: Positive for myalgias and back pain. Negative for gait problem.  Skin: Negative for rash and wound.  Neurological: Positive for headaches. Negative for dizziness, tremors, seizures, syncope, facial asymmetry, weakness, light-headedness and numbness.  Hematological: Negative for adenopathy. Does not bruise/bleed easily.  Psychiatric/Behavioral: Negative for hallucinations, behavioral problems, confusion and agitation.   Allergies  Morphine and related  Home Medications   Current Outpatient Rx  Name Route Sig Dispense Refill  . CLOPIDOGREL BISULFATE 75 MG PO TABS Oral Take 75 mg by mouth daily with breakfast.    . LISINOPRIL 10 MG PO TABS Oral Take 10 mg by mouth daily.    Marland Kitchen METOPROLOL TARTRATE 25 MG PO TABS Oral Take 25 mg by mouth 2 (two)  times daily.    Marland Kitchen SIMVASTATIN 40 MG PO TABS Oral Take 40 mg by mouth every evening.      BP 133/94  Pulse 68  Temp(Src) 97.7 F (36.5 C) (Oral)  SpO2 94%  Physical Exam  General: not in acute distress. Looks anxious. HEENT: PERRL, EOMI, no scleral icterus Cardiac: S1/S2, RRR, No murmurs, gallops or rubs. Chest wall: no  tenderness over chest wall. Pulm: Good air movement bilaterally, Clear to auscultation bilaterally, No rales, wheezing, rhonchi or rubs. Abd: Soft,  nondistended, nontender, no rebound pain, no organomegaly, BS present Ext: No rashes or edema, 2+DP/PT pulse bilaterally. There is no tenderness, swelling or redness over calf areas. Neuro: alert and oriented X3, cranial nerves II-XII grossly intact, muscle strength 5/5 in all extremeties,  sensation to light touch intact.      Date: 01/26/2012  Rate: 60  Rhythm: normal sinus rhythm  QRS Axis: normal  Intervals: normal  ST/T Wave abnormalities: T wave inversion in anterior leads which did not change significantly compared with previous EKG of Jan 17, 2012.  Conduction Disutrbances:none  Narrative Interpretation:   Old EKG Reviewed: unchanged  ED Course  Procedures (including critical care time) . Patient's chest pain is positional in nature. His EKG has no significant change compared to previous one. But he has significant history of CAD. Will get troponin to rule out ACS. Will get CXR, CBC, CMP.    Labs Reviewed  CBC  DIFFERENTIAL  BASIC METABOLIC PANEL   No results found.   No diagnosis found.    MDM          Lorretta Harp, MD 01/27/12 2114

## 2012-01-26 NOTE — Consult Note (Signed)
Reason for Consult: Chest pain  Requesting Physician: ER  HPI: This is a 46 y.o. male from [place] with a past medical history significant for CAD. He had LAD and CFX intervention in [place] in 2005. He had a BS ICD placed in [place] in 2007. He had been living in [place] until recently. He had seen several doctors in [facility] for chest pain He came to [facility] 12/29/11 to visit his sister. On the morning of 12/30/11 he had chest pain and come to the ER. Initial EKG was OK but a second EKG showed ST elevation and a code STEMI was activated. He was taken to the cath lab by Dr Tresa Endo and had  An LAD DES placed. During that admission his ICD was checked. Echo prior to discharge showed an EF of 40%. He was to see [place] in the office Monday as a post hospital follow up. He is seen now in the ER with complaints of Rt chest pain and pain mid chest when he roles to either side. While examining him he had what appeared to be a back spasm. He has already received Toradol in ER. He has been compliant with his Plavix but not all his other meds.  PMHx:  Past Medical History  Diagnosis Date  . Myocardial infarction   . Hypertension   . Coronary artery disease   . Diverticulitis    Past Surgical History  Procedure Date  . Coronary angioplasty   . Tonsillectomy   . Eye surgery   . Hernia repair   . Insert / replace / remove pacemaker     FAMHx: History reviewed. No pertinent family history.  SOCHx:  reports that he has never smoked. His smokeless tobacco use includes Chew. He reports that he does not drink alcohol or use illicit drugs.  ALLERGIES: Allergies  Allergen Reactions  . Morphine And Related Other (See Comments)    unknown    ROS: He denies any hemoptysis, fever or chills. No hematuria or dysuria.  HOME MEDICATIONS:  (Not in a hospital admission)  HOSPITAL MEDICATIONS: I have reviewed the patient's current medications.  VITALS: Blood pressure 114/96, pulse 66, temperature 97.7 F (36.5 C), temperature  source Oral, resp. rate 18, SpO2 99.00%.  PHYSICAL EXAM: General appearance: alert, cooperative and mild distress Neck: no carotid bruit, no JVD and supple, symmetrical, trachea midline Lungs: clear to auscultation bilaterally Heart: regular rate and rhythm Abdomen: obese, non tender Extremities: 1+ edema Skin: Skin color, texture, turgor normal. No rashes or lesions or cool and dry Neurologic: Grossly normal  LABS: Results for orders placed during the hospital encounter of 01/26/12 (from the past 48 hour(s))  CBC     Status: Normal   Collection Time   01/26/12  7:05 AM      Component Value Range Comment   WBC 7.7  4.0 - 10.5 (Simpson/uL)    RBC 4.64  4.22 - 5.81 (MIL/uL)    Hemoglobin 14.7  13.0 - 17.0 (g/dL)    HCT 11.9  14.7 - 82.9 (%)    MCV 91.2  78.0 - 100.0 (fL)    MCH 31.7  26.0 - 34.0 (pg)    MCHC 34.8  30.0 - 36.0 (g/dL)    RDW 56.2  13.0 - 86.5 (%)    Platelets 228  150 - 400 (Simpson/uL)   DIFFERENTIAL     Status: Abnormal   Collection Time   01/26/12  7:05 AM      Component Value Range Comment  Neutrophils Relative 51  43 - 77 (%)    Neutro Abs 3.9  1.7 - 7.7 (Simpson/uL)    Lymphocytes Relative 34  12 - 46 (%)    Lymphs Abs 2.6  0.7 - 4.0 (Simpson/uL)    Monocytes Relative 9  3 - 12 (%)    Monocytes Absolute 0.7  0.1 - 1.0 (Simpson/uL)    Eosinophils Relative 6 (*) 0 - 5 (%)    Eosinophils Absolute 0.4  0.0 - 0.7 (Simpson/uL)    Basophils Relative 1  0 - 1 (%)    Basophils Absolute 0.1  0.0 - 0.1 (Simpson/uL)   BASIC METABOLIC PANEL     Status: Abnormal   Collection Time   01/26/12  7:05 AM      Component Value Range Comment   Sodium 139  135 - 145 (mEq/L)    Potassium 3.5  3.5 - 5.1 (mEq/L)    Chloride 103  96 - 112 (mEq/L)    CO2 24  19 - 32 (mEq/L)    Glucose, Bld 85  70 - 99 (mg/dL)    BUN 12  6 - 23 (mg/dL)    Creatinine, Ser 1.61  0.50 - 1.35 (mg/dL)    Calcium 9.1  8.4 - 10.5 (mg/dL)    GFR calc non Af Amer 88 (*) >90 (mL/min)    GFR calc Af Amer >90  >90 (mL/min)   POCT I-STAT  TROPONIN I     Status: Normal   Collection Time   01/26/12  7:41 AM      Component Value Range Comment   Troponin i, poc 0.02  0.00 - 0.08 (ng/mL)    Comment 3              EKG- NSR poor ant r wave, septal Qs  IMAGING: Dg Chest 2 View  01/26/2012  *RADIOLOGY REPORT*  Clinical Data: Intermittent chest pain for a week  CHEST - 2 VIEW  Comparison: Portable chest x-ray of 12/30/2011  Findings: No active infiltrate or effusion is seen.  Mild peribronchial thickening is noted.  The heart is within upper limits of normal.  A permanent pacemaker remains with AICD lead. No bony abnormality is seen.  IMPRESSION: No active lung disease.  Mild peribronchial thickening may indicate bronchitis.  Permanent pacemaker.  Original Report Authenticated By: Juline Patch, M.D.    IMPRESSION: Active Problems:  Chest pain  STEMI, LAD DES 12/30/11  CAD, Hx of prior stents LAD and CFX 2005 in [place]  PSVT (paroxysmal supraventricular tachycardia), placed on Amioadarone 2/13  Ischemic cardiomyopathy, EF 40%, 2D Feb 2013  ICD in place, BS, implanted 2007, one discharge am of STEMI  Obesity  Chronic back pain   RECOMMENDATION: Will give IV Valium X1 for presumed back spasm. Chest pain is non cardiac. Troponin negative. MD to see.  Time Spent Directly with Patient:  35 minutes  Gerald Simpson,Gerald Simpson 01/26/2012, 10:53 AM   Agree with note written by Corine Shelter [facility]  Pt with recent STEMI, LAD DES by Dr. Wonda Cheng. LV dysfunct. ICD. EKG without changes from prior EKG. Enz neg. Exam benign. His symptoms are positional and musculoskeletal. Not similar to MI pain. Doubt cardiac. Needs analgesics and antispasmodic meds. Will be avail for further questions. Can probably be D/Cd tomorrow. ROV already scheduled with [place] next week.  Gerald Simpson 01/26/2012 4:21 PM

## 2012-01-26 NOTE — ED Notes (Signed)
MD at bedside. 

## 2012-01-26 NOTE — ED Notes (Signed)
PT. REPORTS MID CHEST PAIN RADIATING TO RIGHT SHOULDER AND NECK FOR 2 DAYS WITH SOB AND NAUSEA.

## 2012-01-26 NOTE — ED Provider Notes (Signed)
Patient turned over to me awaiting consultation from cardiology. From Dr. beat and was told that it cardiology did not want to admit him that he was okay to go home. Cardiology felt that chest pain was not cardiac in nature however they felt he needed admission for the continued chest pain and the back pain. In talking with the patient patient has a long-standing history of chronic back pain with occasional radiation down his left leg as was going on today. When he was discharged a month ago from cardiology following his stent for LAD lesion he was sent home with Percocet for his chronic pain. Clinically to me patient does not require admission but when I wanted to discharge cardiology Dr. Gery Pray felt it was important that he be admitted not from a cardiac standpoint for pain control. I therefore called [facility] as an unassigned admission patient does not have a primary care provider they felt that the patient did not require admission for chronic pain. Not that I disagree with that. Earlier patient only be given portal for his pain I treated him with 1 mg of Dilaudid with significant improvement in his back pain down to a 6/10 we'll treat with another milligram and then discharge home with Lortab his baseline pain medicine and a few Percocet to supplement for more severe pain. Patient seems to be okay with this plan and is willing to go home.  Shelda Jakes, MD 01/26/12 [phone]

## 2012-01-26 NOTE — Progress Notes (Signed)
Patient ID: Gerald Simpson, male   DOB: [birth year], 46 y.o.   MRN: [id number] Received a call from [facility] Re: Patient Gerald Simpson. Per Dr. Deretha Emory, [name] presented with chest pain that was evaluated by Dr. Allyson Sabal and felt not to be Cardiac in nature but musculoskeletal in nature. Dr. Allyson Sabal suggested that the patient may require admission for pain control as he would likely be back to the ED if not admitted. Dr. Deretha Emory discussed that the original plan was to discharge the patient to home with pain medications. He discussed that the patient had received Toradol and Valium in the Emergency Room. I advised Dr. Deretha Emory that based on the discussion, I agreed that musculoskeletal pain is usually treated with oral analgesics up to and including narcotics, and muscle relaxants. I advised that based on the discussion, I would D/C to home with a prescription for analgesics (and muscle relaxants if he felt that they were warranted) and the patient should follow up with his primary medical provider for further evaluation.

## 2012-01-26 NOTE — Discharge Instructions (Signed)
Followup with cardiology as scheduled. Return for new or worse chest pain. Take pain medicine as needed for your chronic back pain. Resource guide provided to help find a primary care provider locally. Return for new or worse symptoms.

## 2012-01-28 NOTE — ED Provider Notes (Signed)
I saw and evaluated the patient, reviewed the resident's note and I agree with the findings and plan.   .Face to face Exam:  General:  Awake HEENT:  Atraumatic Resp:  Normal effort Abd:  Nondistended Neuro:No focal weakness Lymph: No adenopathy   Will be seen by cardiology   Nelia Shi, MD 01/28/12 1036

## 2012-02-21 ENCOUNTER — Encounter (HOSPITAL_COMMUNITY): Payer: Self-pay | Admitting: Emergency Medicine

## 2012-02-21 ENCOUNTER — Emergency Department (HOSPITAL_COMMUNITY)
Admission: EM | Admit: 2012-02-21 | Discharge: 2012-02-21 | Disposition: A | Discharge status: Home / Self Care | Payer: Medicaid Other | Attending: Emergency Medicine | Admitting: Emergency Medicine

## 2012-02-21 DIAGNOSIS — I2510 Atherosclerotic heart disease of native coronary artery without angina pectoris: Secondary | ICD-10-CM | POA: Insufficient documentation

## 2012-02-21 DIAGNOSIS — IMO0002 Reserved for concepts with insufficient information to code with codable children: Secondary | ICD-10-CM | POA: Insufficient documentation

## 2012-02-21 DIAGNOSIS — I252 Old myocardial infarction: Secondary | ICD-10-CM | POA: Insufficient documentation

## 2012-02-21 DIAGNOSIS — M5416 Radiculopathy, lumbar region: Secondary | ICD-10-CM

## 2012-02-21 DIAGNOSIS — I10 Essential (primary) hypertension: Secondary | ICD-10-CM | POA: Insufficient documentation

## 2012-02-21 MED ORDER — DIAZEPAM 5 MG PO TABS
5.0000 mg | ORAL_TABLET | Freq: Once | ORAL | Status: AC
  Administered 2012-02-21: 5 mg via ORAL
  Filled 2012-02-21: qty 1

## 2012-02-21 MED ORDER — PREDNISONE 20 MG PO TABS
60.0000 mg | ORAL_TABLET | Freq: Every day | ORAL | Status: DC
Start: 2012-02-22 — End: 2012-02-21
  Administered 2012-02-21: 60 mg via ORAL
  Filled 2012-02-21: qty 3

## 2012-02-21 MED ORDER — OXYCODONE-ACETAMINOPHEN 5-325 MG PO TABS
1.0000 | ORAL_TABLET | Freq: Once | ORAL | Status: AC
  Administered 2012-02-21: 1 via ORAL
  Filled 2012-02-21: qty 1

## 2012-02-21 MED ORDER — PREDNISONE 20 MG PO TABS: 40.0000 mg | ORAL_TABLET | Freq: Every day | ORAL | Status: DC

## 2012-02-21 MED ORDER — DIAZEPAM 5 MG PO TABS: 5.0000 mg | ORAL_TABLET | Freq: Three times a day (TID) | ORAL | Status: AC | PRN

## 2012-02-21 MED ORDER — HYDROCODONE-ACETAMINOPHEN 5-500 MG PO TABS: 1.0000 | ORAL_TABLET | Freq: Four times a day (QID) | ORAL | Status: AC | PRN

## 2012-02-21 NOTE — ED Provider Notes (Signed)
History     CSN: [id number]  Arrival date & time 02/21/12  1031   First MD Initiated Contact with Patient 02/21/12 1155      Chief Complaint  Patient presents with  . Back Pain    (Consider location/radiation/quality/duration/timing/severity/associated sxs/prior treatment) Patient is a 46 y.o. male presenting with back pain. The history is provided by the patient.  Back Pain  This is a recurrent problem. The current episode started yesterday. The problem occurs constantly. The problem has been gradually worsening. The pain is associated with lifting heavy objects. The pain is present in the lumbar spine. The quality of the pain is described as stabbing. The pain radiates to the left thigh. The pain is at a severity of 9/10. The pain is moderate. Pertinent negatives include no fever, no numbness, no abdominal pain, no dysuria and no weakness.  Pt states he was installing a motor into his lawn mower. States it is about 200lb and he had to pick it up. Felt pain in lower back yesterday, but worsened over night and this morning. Radiating into left leg. States was told he may potentially have a herniated disk, but has a pacemaker and unable have an MRI. States pain feels like last time he had this. Denies weakness or numbness in lower extremities, over perineum. Denies urinary incontinence or retention, denies loss of bowels. Denies taking any medications for this.   Past Medical History  Diagnosis Date  . Myocardial infarction   . Hypertension   . Coronary artery disease   . Diverticulitis   . Gout     Past Surgical History  Procedure Date  . Coronary angioplasty   . Tonsillectomy   . Eye surgery   . Hernia repair   . Insert / replace / remove pacemaker   . Cardiac defibrillator placement     History reviewed. No pertinent family history.  History  Substance Use Topics  . Smoking status: Never Smoker   . Smokeless tobacco: Current User    Types: Chew  . Alcohol Use: No       Review of Systems  Constitutional: Negative for fever and chills.  HENT: Negative.   Respiratory: Negative.   Cardiovascular: Negative.   Gastrointestinal: Negative for nausea, vomiting and abdominal pain.  Genitourinary: Negative for dysuria and flank pain.  Musculoskeletal: Positive for back pain. Negative for gait problem.  Skin: Negative.   Neurological: Negative for dizziness, weakness and numbness.  Psychiatric/Behavioral: Negative.     Allergies  Morphine and related  Home Medications   Current Outpatient Rx  Name Route Sig Dispense Refill  . ASPIRIN EC 81 MG PO TBEC Oral Take 81 mg by mouth daily.    Marland Kitchen CLOPIDOGREL BISULFATE 75 MG PO TABS Oral Take 75 mg by mouth daily with breakfast.    . FUROSEMIDE 20 MG PO TABS Oral Take 20 mg by mouth daily.    Marland Kitchen LISINOPRIL 10 MG PO TABS Oral Take 10 mg by mouth at bedtime.     Marland Kitchen METOPROLOL TARTRATE 25 MG PO TABS Oral Take 25 mg by mouth 2 (two) times daily.    Marland Kitchen SIMVASTATIN 40 MG PO TABS Oral Take 40 mg by mouth every evening.      BP 111/76  Pulse 59  Temp(Src) 98.3 F (36.8 C) (Oral)  SpO2 97%  Physical Exam  Nursing note and vitals reviewed. Constitutional: He is oriented to person, place, and time. He appears well-developed and well-nourished. No distress.  Eyes: Conjunctivae are normal.  Neck: Neck supple.  Cardiovascular: Normal rate, regular rhythm and normal heart sounds.   Pulmonary/Chest: Effort normal and breath sounds normal. No respiratory distress.  Abdominal: Soft. Bowel sounds are normal. There is no tenderness.  Musculoskeletal:       Lumbar spine midline tenderness. No swelling or bruising. Pain with left straight leg raise.   Neurological: He is alert and oriented to person, place, and time.       Normal and equal 2+ patellar reflexes bilaterally, pt able to dorsiflex bilateral feet and great toes. Normal sensation and equal over bilateral thighs and lower legs.   Skin: Skin is warm and dry.   Psychiatric: He has a normal mood and affect.    ED Course  Procedures (including critical care time)  I suspect pt's symptoms are due to a herniated disk/ nerve compression. He has no red flags based on history and exam. No signs of cauda equina. No emergent imaging necessary. I will treat him with pain medications, muscle relaxants, steroids, and referral for a follow up.  1. Lumbar radiculopathy       MDM          Lottie Mussel, PA 02/21/12 1546

## 2012-02-21 NOTE — ED Notes (Signed)
Pt contacted family member for transport

## 2012-02-21 NOTE — ED Notes (Signed)
Per ptar- pt has chronic hx of lower back pain and yesterday was lifting a heavy object .  This morning when pt woke up he felt pain from lower lumbar radiating down to lower ankle and is getting progressively worse. Pt denies fall or injury. Pt was alert oriented and ambulatory on arrival. Pt reports he can not have any MRI do metal fragments in back.

## 2012-02-21 NOTE — ED Notes (Signed)
Pt reports lower back that started last night and progressively got worse throughout the night.  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  Pain radiates to groin area down to left lower leg.

## 2012-02-21 NOTE — Discharge Instructions (Signed)
Avoid strenuous activity. Take vicodin as prescribed as needed for severe pain. Take valium as prescribed as needed for spasms. Take prednisone for inflammation as prescribed until all gone. Follow up with orthopedics as referred. Return if worsening, have fever, unable to feel your legs, or unable to control your bowels or urine.   Lumbosacral Radiculopathy Lumbosacral radiculopathy is a pinched nerve or nerves in the low back (lumbosacral area). When this happens you may have weakness in your legs and may not be able to stand on your toes. You may have pain going down into your legs. There may be difficulties with walking normally. There are many causes of this problem. Sometimes this may happen from an injury, or simply from arthritis or boney problems. It may also be caused by other illnesses such as diabetes. If there is no improvement after treatment, further studies may be done to find the exact cause. DIAGNOSIS  X-rays may be needed if the problems become long standing. Electromyograms may be done. This study is one in which the working of nerves and muscles is studied. HOME CARE INSTRUCTIONS   Applications of ice packs may be helpful. Ice can be used in a plastic bag with a towel around it to prevent frostbite to skin. This may be used every 2 hours for 20 to 30 minutes, or as needed, while awake, or as directed by your caregiver.   Only take over-the-counter or prescription medicines for pain, discomfort, or fever as directed by your caregiver.   If physical therapy was prescribed, follow your caregiver's directions.  SEEK IMMEDIATE MEDICAL CARE IF:   You have pain not controlled with medications.   You seem to be getting worse rather than better.   You develop increasing weakness in your legs.   You develop loss of bowel or bladder control.   You have difficulty with walking or balance, or develop clumsiness in the use of your legs.   You have a fever.  MAKE SURE YOU:    Understand these instructions.   Will watch your condition.   Will get help right away if you are not doing well or get worse.  Document Released: 10/23/2005 Document Revised: 10/12/2011 Document Reviewed: 06/12/2008 [facility] Patient Information 2012 [place], [place].

## 2012-02-22 NOTE — ED Provider Notes (Signed)
Medical screening examination/treatment/procedure(s) were performed by non-physician practitioner and as supervising physician I was immediately available for consultation/collaboration.  Blanchie Zeleznik, MD 02/22/12 0729 

## 2012-03-04 ENCOUNTER — Other Ambulatory Visit: Payer: Self-pay | Admitting: Orthopedic Surgery

## 2012-03-04 DIAGNOSIS — M545 Low back pain: Secondary | ICD-10-CM

## 2012-03-04 DIAGNOSIS — R200 Anesthesia of skin: Secondary | ICD-10-CM

## 2012-03-04 DIAGNOSIS — R531 Weakness: Secondary | ICD-10-CM

## 2012-03-07 ENCOUNTER — Other Ambulatory Visit: Payer: Medicaid Other

## 2012-03-26 ENCOUNTER — Ambulatory Visit
Admission: RE | Admit: 2012-03-26 | Discharge: 2012-03-26 | Disposition: A | Discharge status: Home / Self Care | Payer: Medicaid Other | Source: Ambulatory Visit | Attending: Orthopedic Surgery | Admitting: Orthopedic Surgery

## 2012-03-26 DIAGNOSIS — R531 Weakness: Secondary | ICD-10-CM

## 2012-03-26 DIAGNOSIS — M545 Low back pain: Secondary | ICD-10-CM

## 2012-03-26 DIAGNOSIS — R200 Anesthesia of skin: Secondary | ICD-10-CM

## 2012-04-06 ENCOUNTER — Observation Stay (HOSPITAL_COMMUNITY)
Admission: EM | Admit: 2012-04-06 | Discharge: 2012-04-06 | Disposition: A | Discharge status: Home / Self Care | Payer: Medicaid Other | Source: Ambulatory Visit | Attending: Emergency Medicine | Admitting: Emergency Medicine

## 2012-04-06 ENCOUNTER — Encounter (HOSPITAL_COMMUNITY): Payer: Self-pay | Admitting: Emergency Medicine

## 2012-04-06 ENCOUNTER — Emergency Department (HOSPITAL_COMMUNITY): Payer: Medicaid Other

## 2012-04-06 DIAGNOSIS — E785 Hyperlipidemia, unspecified: Secondary | ICD-10-CM | POA: Insufficient documentation

## 2012-04-06 DIAGNOSIS — I2510 Atherosclerotic heart disease of native coronary artery without angina pectoris: Secondary | ICD-10-CM | POA: Insufficient documentation

## 2012-04-06 DIAGNOSIS — Z95810 Presence of automatic (implantable) cardiac defibrillator: Secondary | ICD-10-CM | POA: Insufficient documentation

## 2012-04-06 DIAGNOSIS — G8929 Other chronic pain: Secondary | ICD-10-CM | POA: Insufficient documentation

## 2012-04-06 DIAGNOSIS — M25519 Pain in unspecified shoulder: Secondary | ICD-10-CM | POA: Insufficient documentation

## 2012-04-06 DIAGNOSIS — I252 Old myocardial infarction: Secondary | ICD-10-CM | POA: Insufficient documentation

## 2012-04-06 DIAGNOSIS — F419 Anxiety disorder, unspecified: Secondary | ICD-10-CM

## 2012-04-06 DIAGNOSIS — R079 Chest pain, unspecified: Principal | ICD-10-CM | POA: Insufficient documentation

## 2012-04-06 DIAGNOSIS — I10 Essential (primary) hypertension: Secondary | ICD-10-CM | POA: Insufficient documentation

## 2012-04-06 DIAGNOSIS — R0602 Shortness of breath: Secondary | ICD-10-CM | POA: Insufficient documentation

## 2012-04-06 HISTORY — DX: Other fracture of unspecified lower leg, initial encounter for closed fracture: S82.899A

## 2012-04-06 HISTORY — DX: Dorsalgia, unspecified: M54.9

## 2012-04-06 HISTORY — DX: Other specified soft tissue disorders: M79.89

## 2012-04-06 HISTORY — DX: Reserved for concepts with insufficient information to code with codable children: IMO0002

## 2012-04-06 HISTORY — DX: Other chronic pain: G89.29

## 2012-04-06 HISTORY — DX: Hyperlipidemia, unspecified: E78.5

## 2012-04-06 HISTORY — DX: Anxiety disorder, unspecified: F41.9

## 2012-04-06 LAB — BASIC METABOLIC PANEL
GFR calc Af Amer: >90 mL/min (ref >90)
GFR calc non Af Amer: 90 mL/min — ABNORMAL LOW (ref >90)
Potassium: 4 mEq/L (ref 3.5–5.1)
Sodium: 134 mEq/L — ABNORMAL LOW (ref 135–145)

## 2012-04-06 LAB — ETHANOL: Alcohol, Ethyl (B): <11 mg/dL (ref 0–11)

## 2012-04-06 LAB — POCT I-STAT TROPONIN I: Troponin i, poc: 0.01 ng/mL (ref 0.00–0.08)

## 2012-04-06 LAB — DIFFERENTIAL
Basophils Absolute: 0 10*3/uL (ref 0.0–0.1)
Basophils Relative: 0 % (ref 0–1)
Eosinophils Absolute: 0.2 10*3/uL (ref 0.0–0.7)
Lymphs Abs: 2.1 10*3/uL (ref 0.7–4.0)
Neutrophils Relative %: 58 % (ref 43–77)

## 2012-04-06 LAB — CBC
MCH: 32 pg (ref 26.0–34.0)
MCHC: 35.3 g/dL (ref 30.0–36.0)
Platelets: 183 10*3/uL (ref 150–400)
RBC: 4.59 MIL/uL (ref 4.22–5.81)

## 2012-04-06 MED ORDER — PREDNISONE 20 MG PO TABS
60.0000 mg | ORAL_TABLET | Freq: Once | ORAL | Status: AC
  Administered 2012-04-06: 60 mg via ORAL
  Filled 2012-04-06: qty 3

## 2012-04-06 MED ORDER — PREDNISONE 20 MG PO TABS: 60.0000 mg | ORAL_TABLET | Freq: Every day | ORAL | Status: DC

## 2012-04-06 MED ORDER — LORAZEPAM 2 MG/ML IJ SOLN
1.0000 mg | Freq: Once | INTRAMUSCULAR | Status: AC
  Administered 2012-04-06: 1 mg via INTRAVENOUS
  Filled 2012-04-06: qty 1

## 2012-04-06 MED ORDER — OXYCODONE-ACETAMINOPHEN 5-325 MG PO TABS
1.0000 | ORAL_TABLET | Freq: Once | ORAL | Status: AC
  Administered 2012-04-06: 1 via ORAL
  Filled 2012-04-06: qty 1

## 2012-04-06 MED ORDER — LORAZEPAM 2 MG/ML IJ SOLN
1.0000 mg | Freq: Once | INTRAMUSCULAR | Status: AC
  Administered 2012-04-06: 1 mg via INTRAVENOUS

## 2012-04-06 MED ORDER — FAMOTIDINE 20 MG PO TABS
20.0000 mg | ORAL_TABLET | Freq: Two times a day (BID) | ORAL | Status: DC
  Administered 2012-04-06: 20 mg via ORAL
  Filled 2012-04-06: qty 1

## 2012-04-06 MED ORDER — LORAZEPAM 2 MG/ML IJ SOLN
INTRAMUSCULAR | Status: AC
  Filled 2012-04-06: qty 1

## 2012-04-06 MED ORDER — KETOROLAC TROMETHAMINE 30 MG/ML IJ SOLN
30.0000 mg | Freq: Once | INTRAMUSCULAR | Status: AC
  Administered 2012-04-06: 30 mg via INTRAVENOUS
  Filled 2012-04-06: qty 1

## 2012-04-06 MED ORDER — ALBUTEROL SULFATE HFA 108 (90 BASE) MCG/ACT IN AERS: 1.0000 | INHALATION_SPRAY | RESPIRATORY_TRACT | Status: DC | PRN

## 2012-04-06 MED ORDER — ALBUTEROL SULFATE (5 MG/ML) 0.5% IN NEBU
5.0000 mg | INHALATION_SOLUTION | Freq: Once | RESPIRATORY_TRACT | Status: AC
  Administered 2012-04-06: 5 mg via RESPIRATORY_TRACT
  Filled 2012-04-06: qty 1

## 2012-04-06 MED ORDER — ONDANSETRON HCL 4 MG/2ML IJ SOLN: 4.0000 mg | Freq: Four times a day (QID) | INTRAMUSCULAR | Status: DC | PRN

## 2012-04-06 MED ORDER — DIPHENHYDRAMINE HCL 50 MG/ML IJ SOLN
25.0000 mg | Freq: Once | INTRAMUSCULAR | Status: AC
  Administered 2012-04-06: 25 mg via INTRAVENOUS
  Filled 2012-04-06: qty 1

## 2012-04-06 MED ORDER — ALBUTEROL SULFATE (5 MG/ML) 0.5% IN NEBU
INHALATION_SOLUTION | RESPIRATORY_TRACT | Status: AC
  Filled 2012-04-06: qty 1

## 2012-04-06 MED ORDER — FENTANYL CITRATE 0.05 MG/ML IJ SOLN
50.0000 ug | Freq: Once | INTRAMUSCULAR | Status: AC
  Administered 2012-04-06: 50 ug via INTRAVENOUS
  Filled 2012-04-06: qty 2

## 2012-04-06 MED ORDER — ASPIRIN 81 MG PO CHEW
324.0000 mg | CHEWABLE_TABLET | Freq: Once | ORAL | Status: AC
  Administered 2012-04-06: 324 mg via ORAL
  Filled 2012-04-06: qty 4

## 2012-04-06 MED ORDER — ONDANSETRON HCL 4 MG/2ML IJ SOLN
4.0000 mg | Freq: Once | INTRAMUSCULAR | Status: AC
  Administered 2012-04-06: 4 mg via INTRAVENOUS
  Filled 2012-04-06: qty 2

## 2012-04-06 MED ORDER — ALBUTEROL SULFATE (5 MG/ML) 0.5% IN NEBU
5.0000 mg | INHALATION_SOLUTION | Freq: Once | RESPIRATORY_TRACT | Status: AC
  Administered 2012-04-06: 5 mg via RESPIRATORY_TRACT

## 2012-04-06 NOTE — Discharge Instructions (Signed)
Follow up with your doctor next week for reevaluation.

## 2012-04-06 NOTE — ED Provider Notes (Signed)
Date: 04/06/2012  Rate: 73  Rhythm: normal sinus rhythm  QRS Axis: normal  Intervals: normal  ST/T Wave abnormalities: nonspecific ST changes  Conduction Disutrbances:none  Narrative Interpretation:   Old EKG Reviewed: unchanged  Pt here for reported anxiety at this time Stable, workup initiated by PA Hunt BP 122/71  Pulse 73  Temp(Src) 97.8 F (36.6 C) (Oral)  Resp 15  SpO2 97%     Joya Gaskins, MD 04/06/12 [phone]

## 2012-04-06 NOTE — ED Provider Notes (Signed)
History     CSN: [id number]  Arrival date & time 04/06/12  0355   First MD Initiated Contact with Patient 04/06/12 0411      Chief Complaint  Patient presents with  . Chest Pain  . Shortness of Breath    (Consider location/radiation/quality/duration/timing/severity/associated sxs/prior treatment) HPI  Patient has history of STEMI is followed by Dr. Tresa Endo presents to the emergency department by EMS stating "I'm having an anxiety attack." However patient did state he had was having some chest pain to EMS. Patient states that he has history of some chronic right shoulder pain that has been ongoing problem and this evening when he went to lay down had right shoulder pain he states "with keeping me from sleeping." Patient states because of the pain and difficulty sleeping he began to have an anxiety attack. Patient states it is he felt very anxious he began to feel short of breath. Patient states that due to the shortness of breath he was "afraid to go to sleep." Patient states that this fear any ongoing pain worsening anxiety. At that time patient called EMS. Patient declined nitroglycerin stating he was given a headache and declined taking aspirin stating "my mouth is too dry to swallow." Patient states that he is having pain in the right lateral aspect of his chest into his shoulder. Patient states this is the same pain he's been having for multiple months. He denies any left-sided chest pain, diaphoresis, fevers, chills, cough, abdominal pain, nausea, vomiting, lower extremity pain or swelling. Symptoms were gradual onset, persistent, and unchanging.  Past Medical History  Diagnosis Date  . Myocardial infarction   . Hypertension   . Coronary artery disease   . Diverticulitis   . Gout   . Hyperlipemia     Past Surgical History  Procedure Date  . Coronary angioplasty   . Tonsillectomy   . Eye surgery   . Hernia repair   . Insert / replace / remove pacemaker   . Cardiac defibrillator  placement     No family history on file.  History  Substance Use Topics  . Smoking status: Never Smoker   . Smokeless tobacco: Current User    Types: Chew  . Alcohol Use: No      Review of Systems  All other systems reviewed and are negative.    Allergies  Morphine and related and Other  Home Medications   Current Outpatient Rx  Name Route Sig Dispense Refill  . ASPIRIN EC 81 MG PO TBEC Oral Take 81 mg by mouth daily.    Marland Kitchen CLOPIDOGREL BISULFATE 75 MG PO TABS Oral Take 75 mg by mouth daily with breakfast.    . FUROSEMIDE 20 MG PO TABS Oral Take 20 mg by mouth daily.    Marland Kitchen HYDROCODONE-ACETAMINOPHEN 5-325 MG PO TABS Oral Take 1 tablet by mouth every 6 (six) hours as needed. Pain in back and leg    . LISINOPRIL 10 MG PO TABS Oral Take 10 mg by mouth at bedtime.     Marland Kitchen METOPROLOL TARTRATE 25 MG PO TABS Oral Take 25 mg by mouth 2 (two) times daily.    Marland Kitchen NITROGLYCERIN 0.4 MG SL SUBL Sublingual Place 0.4 mg under the tongue every 5 (five) minutes as needed. Chest pain    . SIMVASTATIN 40 MG PO TABS Oral Take 40 mg by mouth every evening.      BP 122/71  Pulse 73  Temp(Src) 97.8 F (36.6 C) (Oral)  Resp 15  SpO2  97%  Physical Exam  Nursing note and vitals reviewed. Constitutional: He is oriented to person, place, and time. He appears well-developed and well-nourished. No distress.  HENT:  Head: Normocephalic and atraumatic.  Eyes: Conjunctivae are normal.  Neck: Normal range of motion. Neck supple.  Cardiovascular: Normal rate, regular rhythm, normal heart sounds and intact distal pulses.  Exam reveals no gallop and no friction rub.   No murmur heard. Pulmonary/Chest: Effort normal and breath sounds normal. No respiratory distress. He has no wheezes. He has no rales. He exhibits tenderness.       Mild TTP of right anterior chest into right shoulder but no skin changes or crepitous.   Abdominal: Soft. Bowel sounds are normal. He exhibits no distension and no mass. There is  no tenderness. There is no rebound and no guarding.  Musculoskeletal: Normal range of motion. He exhibits tenderness. He exhibits no edema.       Mild TTP of right shoulder with pain with FROM but no crepitous or deformity. No heat or erythema of shoulder. Normal reflex and sensation of RUE. Normal radial pulses bilaterally.   Neurological: He is alert and oriented to person, place, and time.  Skin: Skin is warm and dry. No rash noted. He is not diaphoretic. No erythema.  Psychiatric: His mood appears anxious.    ED Course  Procedures (including critical care time)  IV ativan, fentanyl and zofran. ASA PO.   Patient had acute onset itching and diffuse urticaria while in ER. Treated with IV benadryl, ativan, albuterol and PO prednisone. Patient states "this happens to me at home and I have to take benadryl. We can't figure out why it happens." he denies facial swelling or wheezing.   Labs Reviewed  BASIC METABOLIC PANEL - Abnormal; Notable for the following:    Sodium 134 (*)    Glucose, Bld 132 (*)    GFR calc non Af Amer 90 (*)    All other components within normal limits  CBC  DIFFERENTIAL  ETHANOL  POCT I-STAT TROPONIN I   Dg Chest 2 View  04/06/2012  *RADIOLOGY REPORT*  Clinical Data: Chest pain and pressure  CHEST - 2 VIEW  Comparison: 12/29/2011  Findings: Stable appearance of cardiac pacemaker.  Shallow inspiration.  Heart size and pulmonary vascularity are normal. Lungs appear clear and expanded.  No focal airspace consolidation. No blunting of costophrenic angles.  No pneumothorax.  No significant change since the previous study.  IMPRESSION: No evidence of active pulmonary disease.  Original Report Authenticated By: Marlon Pel, M.D.     No diagnosis found.    MDM  Patient signed out to Dr Bebe Shaggy who will continue to follow allergic reaction in ER and for further evaluation of anxiety, SOB and right shoulder/chest pain. No acute findings on labs, chest xray or  EKG.         [place], [place] 04/06/12 0600

## 2012-04-06 NOTE — ED Provider Notes (Signed)
Medical screening examination/treatment/procedure(s) were conducted as a shared visit with non-physician practitioner(s) and myself.  I personally evaluated the patient during the encounter  Pt with allergic rxn, will place on cdu protocol (no angioedema but has urticaria, scattered wheezing) Reports this occurs with anxiety  Family history - negative for CAD, positive for allergic reactions  Joya Gaskins, MD 04/06/12 [phone]

## 2012-04-06 NOTE — ED Notes (Signed)
Patient with chest pain and shortness of breath, chest pain is described as a heaviness, anxiety issues, refused nitro due to giving him a headache, refused ASA due to not having a glass of water.

## 2012-04-06 NOTE — ED Notes (Signed)
Patient stating to turn red and itch.  Patient states that when he gets stressed or anxious, he goes red and has itchy skin and feels hot.  Patient with audible wheezing.

## 2012-04-15 ENCOUNTER — Emergency Department (HOSPITAL_COMMUNITY)
Admission: EM | Admit: 2012-04-15 | Discharge: 2012-04-15 | Disposition: A | Discharge status: Home / Self Care | Payer: Medicaid Other | Attending: Emergency Medicine | Admitting: Emergency Medicine

## 2012-04-15 ENCOUNTER — Encounter (HOSPITAL_COMMUNITY): Payer: Self-pay | Admitting: Emergency Medicine

## 2012-04-15 DIAGNOSIS — Z79899 Other long term (current) drug therapy: Secondary | ICD-10-CM | POA: Insufficient documentation

## 2012-04-15 DIAGNOSIS — I10 Essential (primary) hypertension: Secondary | ICD-10-CM | POA: Insufficient documentation

## 2012-04-15 DIAGNOSIS — I2510 Atherosclerotic heart disease of native coronary artery without angina pectoris: Secondary | ICD-10-CM | POA: Insufficient documentation

## 2012-04-15 DIAGNOSIS — M545 Low back pain, unspecified: Secondary | ICD-10-CM | POA: Insufficient documentation

## 2012-04-15 DIAGNOSIS — M549 Dorsalgia, unspecified: Secondary | ICD-10-CM

## 2012-04-15 DIAGNOSIS — M5416 Radiculopathy, lumbar region: Secondary | ICD-10-CM

## 2012-04-15 DIAGNOSIS — G8929 Other chronic pain: Secondary | ICD-10-CM | POA: Insufficient documentation

## 2012-04-15 DIAGNOSIS — M109 Gout, unspecified: Secondary | ICD-10-CM | POA: Insufficient documentation

## 2012-04-15 DIAGNOSIS — E785 Hyperlipidemia, unspecified: Secondary | ICD-10-CM | POA: Insufficient documentation

## 2012-04-15 DIAGNOSIS — I252 Old myocardial infarction: Secondary | ICD-10-CM | POA: Insufficient documentation

## 2012-04-15 MED ORDER — HYDROCODONE-ACETAMINOPHEN 5-325 MG PO TABS: 1.0000 | ORAL_TABLET | ORAL | Status: AC | PRN

## 2012-04-15 MED ORDER — PREDNISONE 50 MG PO TABS: 50.0000 mg | ORAL_TABLET | Freq: Every day | ORAL | Status: AC

## 2012-04-15 MED ORDER — DIAZEPAM 5 MG PO TABS
5.0000 mg | ORAL_TABLET | Freq: Once | ORAL | Status: AC
  Administered 2012-04-15: 5 mg via ORAL
  Filled 2012-04-15: qty 1

## 2012-04-15 MED ORDER — HYDROMORPHONE HCL PF 1 MG/ML IJ SOLN
1.0000 mg | Freq: Once | INTRAMUSCULAR | Status: AC
  Administered 2012-04-15: 1 mg via INTRAMUSCULAR
  Filled 2012-04-15: qty 1

## 2012-04-15 MED ORDER — DIAZEPAM 5 MG PO TABS: 5.0000 mg | ORAL_TABLET | Freq: Two times a day (BID) | ORAL | Status: AC

## 2012-04-15 MED ORDER — KETOROLAC TROMETHAMINE 60 MG/2ML IM SOLN
60.0000 mg | Freq: Once | INTRAMUSCULAR | Status: AC
  Administered 2012-04-15: 60 mg via INTRAMUSCULAR
  Filled 2012-04-15: qty 2

## 2012-04-15 NOTE — ED Provider Notes (Signed)
Medical screening examination/treatment/procedure(s) were performed by non-physician practitioner and as supervising physician I was immediately available for consultation/collaboration.   Forbes Cellar, MD 04/15/12 1816
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
## 2012-04-15 NOTE — Discharge Instructions (Signed)
Followup with your primary care Dr. for recheck.  Return here for any worsening in your condition.  Use ice and heat on your lower back

## 2012-04-25 ENCOUNTER — Encounter (HOSPITAL_COMMUNITY): Payer: Self-pay | Admitting: *Deleted

## 2012-04-25 ENCOUNTER — Emergency Department (HOSPITAL_COMMUNITY)
Admission: EM | Admit: 2012-04-25 | Discharge: 2012-04-25 | Disposition: A | Discharge status: Home / Self Care | Payer: Medicaid Other | Attending: Emergency Medicine | Admitting: Emergency Medicine

## 2012-04-25 DIAGNOSIS — T675XXA Heat exhaustion, unspecified, initial encounter: Secondary | ICD-10-CM

## 2012-04-25 DIAGNOSIS — X30XXXA Exposure to excessive natural heat, initial encounter: Secondary | ICD-10-CM | POA: Insufficient documentation

## 2012-04-25 DIAGNOSIS — I2510 Atherosclerotic heart disease of native coronary artery without angina pectoris: Secondary | ICD-10-CM | POA: Insufficient documentation

## 2012-04-25 DIAGNOSIS — T678XXA Other effects of heat and light, initial encounter: Secondary | ICD-10-CM | POA: Insufficient documentation

## 2012-04-25 DIAGNOSIS — M109 Gout, unspecified: Secondary | ICD-10-CM | POA: Insufficient documentation

## 2012-04-25 DIAGNOSIS — E785 Hyperlipidemia, unspecified: Secondary | ICD-10-CM | POA: Insufficient documentation

## 2012-04-25 DIAGNOSIS — Z79899 Other long term (current) drug therapy: Secondary | ICD-10-CM | POA: Insufficient documentation

## 2012-04-25 DIAGNOSIS — F411 Generalized anxiety disorder: Secondary | ICD-10-CM | POA: Insufficient documentation

## 2012-04-25 DIAGNOSIS — Z950 Presence of cardiac pacemaker: Secondary | ICD-10-CM | POA: Insufficient documentation

## 2012-04-25 DIAGNOSIS — Z7982 Long term (current) use of aspirin: Secondary | ICD-10-CM | POA: Insufficient documentation

## 2012-04-25 DIAGNOSIS — I10 Essential (primary) hypertension: Secondary | ICD-10-CM | POA: Insufficient documentation

## 2012-04-25 DIAGNOSIS — Z885 Allergy status to narcotic agent status: Secondary | ICD-10-CM | POA: Insufficient documentation

## 2012-04-25 DIAGNOSIS — I252 Old myocardial infarction: Secondary | ICD-10-CM | POA: Insufficient documentation

## 2012-04-25 LAB — CK: Total CK: 190 U/L (ref 7–232)

## 2012-04-25 LAB — POCT I-STAT TROPONIN I
Troponin i, poc: 0 ng/mL (ref 0.00–0.08)
Troponin i, poc: 0 ng/mL (ref 0.00–0.08)

## 2012-04-25 LAB — BASIC METABOLIC PANEL
Chloride: 99 mEq/L (ref 96–112)
GFR calc Af Amer: 80 mL/min — ABNORMAL LOW (ref >90)
Potassium: 4.2 mEq/L (ref 3.5–5.1)

## 2012-04-25 MED ORDER — HYDROCODONE-ACETAMINOPHEN 5-325 MG PO TABS
1.0000 | ORAL_TABLET | Freq: Once | ORAL | Status: AC
  Administered 2012-04-25: 1 via ORAL

## 2012-04-25 MED ORDER — HYDROCODONE-ACETAMINOPHEN 5-325 MG PO TABS
ORAL_TABLET | ORAL | Status: AC
  Filled 2012-04-25: qty 1

## 2012-04-25 MED ORDER — OXYCODONE-ACETAMINOPHEN 5-325 MG PO TABS: 1.0000 | ORAL_TABLET | Freq: Once | ORAL | Status: DC

## 2012-04-25 MED ORDER — SODIUM CHLORIDE 0.9 % IV BOLUS (SEPSIS)
1000.0000 mL | Freq: Once | INTRAVENOUS | Status: AC
  Administered 2012-04-25: 1000 mL via INTRAVENOUS
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
## 2012-04-25 NOTE — ED Notes (Signed)
Pt in from home c/o lower bilateral extremity pain, abd pain, & CP nonradiating, pt c/o SOB, pt denies N/V/D, pt states "I always SOB." Pt hx of x 4 MI with x 4 stent placement, pt has pacemaker, pt A&O x4, follows commands, speaks in complete sentences, pt rcvd 324 ASA & x3 0.4 SL nitro in route, pt NSR in route

## 2012-04-25 NOTE — ED Notes (Signed)
Ethelda Chick, MD notified re: BP & pulse

## 2012-04-25 NOTE — Discharge Instructions (Signed)
Heat Disorders Call the resource guide tomorrow to get a primary care doctor Heat related disorders are illnesses that are caused by continued exposure to hot and humid environments, not drinking enough fluids, and/or your body failing to regulate its own body temperature correctly.  Heat related disorders include:  Heatstroke. When you cannot sweat or regulate your body temperature in an adequate way. This is very dangerous and can be life threatening. Get emergency medical help.   Heat exhaustion. Heavy sweating and a fast heart rate, because of overheating.   Heat cramps. Painful, uncontrollable muscle spasms. Can occur during heavy exercise in hot environments.   Sunburn. Skin becomes red and painful, after being out in the sun.   Heat rash. Sweat ducts become blocked, which traps sweat under the skin. Causes blisters and red bumps, and may cause an itchy or tingling feeling.  Overheating can be dangerous and life-threatening. When exercising, working, or doing other activities in hot and humid environments, do the following:  Stay in air conditioned places, as much as possible.   Wear light-weight, light colored, loose fitting clothing.   Drink enough water to keep your urine clear or pale yellow.   Take added precautions when both heat and humidity are high.   Rest often.  SYMPTOMS   Headache.   Nosebleed.   Weakness.   High temperatures.   Muscle cramps.   Restlessness.   Fainting or dizziness.   Fast breathing and shortness of breath.   Excessive sweating. (There may be little or no sweating in late stages of heat exhaustion.)   Rapid pulse.   Feeling sick to your stomach (nauseous, vomiting).   Skin becoming cold and clammy, or excessively hot and dry.  HOME CARE INSTRUCTIONS   Lie down and rest in a cool or air conditioned area. Wear minimal clothing.   Drink plenty of fluids without caffeine or lots of sugar. Avoid coffee, tea, alcohol or stimulants.     Do not take salt tablets.   Bathe or shower in cool water.   Use a fan. Add cool or warm mist to the air, if possible.   Monitor adults at risk at least twice a day, closely watching for signs of heat exhaustion or heat stroke. Infants and young children require more frequent watching.  SEEK IMMEDIATE MEDICAL CARE IF:   You have a hard time breathing.   You vomit or pass blood in your stool.   You have a seizure, feel dizzy or faint, or pass out.   You develop severe sweating.   Your skin is red, hot and dry (there is no sweating).   You are making very little or no urine, or your urine turns a dark color or has blood in it.   You are unable to keep fluids down.   You develop chest or abdominal pain.   You develop a throbbing headache.   You develop nausea or confusion.  Document Released: 08/20/2009 Document Revised: 10/12/2011 Document Reviewed: 08/20/2009 [facility] Patient Information 2012 [place], [place].  RESOURCE GUIDE  Chronic Pain Problems: Contact [facility]  [phone] Patients need to be referred by their primary care doctor.  Insufficient Money for Medicine: Contact United Way:  call "211" or [facility] 463-095-9962.  No Primary Care Doctor: - Call [facility]  [phone] - can help you locate a primary care doctor that  accepts your insurance, provides certain services, etc. - Physician Referral [phone]  Agencies that provide inexpensive medical  care: [facility]  [phone] - [facility]  [phone] - [facility]  [phone] [facility]  [phone] - [facility]  [phone] [facility]  [phone]  Medicaid-accepting [facility] Providers: - [facility]- [address]  [phone], Mon-Fri 9am-7pm, Sat 9am-1pm - [facility]- [address] [place], [address] [place]  [phone] - [facility]- [address], [address] [place]  [phone] [facility]- [address]  [phone] - Renaye Rakers- [address] [place], [address], [phone]  Only accepts [place] patients after they have their name  applied to their card  Self Pay (no insurance) in [facility]: - Sickle Cell Patients: Dr Willey Blade, [facility]  [address] [place], [phone] - [facility]- [address] [place]  [phone]       [facility]- [address]       -     [facility]- see information above (Speak to [employer] if you do not have insurance)       -  [facility]- [address] [place], [phone]       -  [facility]- [address],  [phone]       -  [facility]- [address], [phone]       -  Dr Julio Sicks-  [address], [place], [phone]       -  [facility]- [address] [phone]       -  [facility]- [address] [phone], also [address] [phone]       -    [facility]- [address] [place], [phone], 1st & 3rd Saturday   every month, 10am-1pm  1) Find a Doctor and Pay Out of Pocket Although you won't have to find out who is covered by your insurance plan, it is a good idea to ask around and get recommendations. You will then need to call the office and see if the doctor you have chosen will accept you as a new patient and what types of options they offer for patients who are self-pay. Some doctors offer discounts or will set up payment plans for their patients who do not have insurance, but you will need to ask so you aren't surprised when you get to your appointment.  2) Contact Your Local Health Department Not all health departments have doctors that can see patients for sick visits, but many do, so it is worth a call to see if yours does. If you don't know where your  local health department is, you can check in your phone book. The CDC also has a tool to help you locate your state's health department, and many state websites also have listings of all of their local health departments.  3) Find a Walk-in Clinic If your illness is not likely to be very severe or complicated, you may want to try a walk in clinic. These are popping up all over the country in pharmacies, drugstores, and shopping centers. They're usually staffed by nurse practitioners or physician assistants that have been trained to treat common illnesses and complaints. They're usually fairly quick and inexpensive. However, if you have serious  medical issues or chronic medical problems, these are probably not your best option  STD Testing - [facility] Department of [facility], STD Clinic, [address], [place], phone [phone] or [phone].  Monday - Friday, call for an appointment. [facility] Department of [employer], STD Clinic, [place] [address], [place], phone [phone] or [phone].  Monday - Friday, call for an appointment.  Abuse/Neglect: [facility] Child Abuse Hotline 475-463-9765 [facility] Child Abuse Hotline (270)781-3358 (After Hours)  Emergency Shelter:  [facility] 320-777-3746  Maternity Homes: - [facility] 365-399-7290 - [facility] (346)544-7280  MRSA Hotline #:   731-695-4087  [facility] Resources  [facility]  [employer] [facility] Dept. [address]                 [address]         [address] [place] [address]  Blondell Reveal Phone:  [phone]                                  Phone:  [phone]                   Phone:  [phone]  [facility], [phone] - [facility]  [phone]       -     [facility] in [place], [address],                                  [phone], [facility] Child Abuse Hotline 7203298444 or [phone] (After Hours)   Behavioral Health Services  Substance Abuse Resources: - Alcohol and Drug Services  (586)632-6445 - [facility] 4707770581 - [facility] [place] (210) [phone] Floydene Flock (772)216-3376 - Residential & Outpatient Substance Abuse Program  (818)109-9353  Psychological Services: [facility]  (985)571-8809 Services  ([phone] - [facility], [phone] [place]. [address] [place], ACCESS LINE: 657-389-5595 or [phone], [employer]za  Dental Assistance  If unable to pay or uninsured, contact:  [facility] or [facility] [place] [facility]. to become qualified for the adult dental clinic.  Patients with Medicaid: [facility] [phone], [phone] [address], [phone]  If unable to pay, or uninsured, contact [facility] [phone]) or [facility] Department ([phone] in [place], [phone] in [facility]) to become qualified for the adult dental clinic  Other Low-Cost Community Dental Services: - [facility]- [address] [place], [place], [place], [address], [phone], Ext. [phone], 2nd and 4th Thursday of the month at 6:30am.  10 clients each day by appointment, can sometimes see walk-in patients if someone does not show for an appointment. [facility]-  [address] [place], [place], [address], [phone] - [facility] [address], [place], [place], [address], [phone] - [facility]- [phone] [facility] Department- [phone] [facility] Department- [phone]

## 2012-04-25 NOTE — ED Notes (Signed)
HEART HEALTHY DIET ORDERED SPOKE WITH MONICA

## 2012-04-28 ENCOUNTER — Encounter (HOSPITAL_BASED_OUTPATIENT_CLINIC_OR_DEPARTMENT_OTHER): Payer: Self-pay | Admitting: *Deleted

## 2012-04-28 ENCOUNTER — Emergency Department (HOSPITAL_BASED_OUTPATIENT_CLINIC_OR_DEPARTMENT_OTHER)
Admission: EM | Admit: 2012-04-28 | Discharge: 2012-04-28 | Disposition: A | Discharge status: Home / Self Care | Payer: Medicaid Other | Attending: Emergency Medicine | Admitting: Emergency Medicine

## 2012-04-28 DIAGNOSIS — M109 Gout, unspecified: Secondary | ICD-10-CM | POA: Insufficient documentation

## 2012-04-28 DIAGNOSIS — E785 Hyperlipidemia, unspecified: Secondary | ICD-10-CM | POA: Insufficient documentation

## 2012-04-28 DIAGNOSIS — I10 Essential (primary) hypertension: Secondary | ICD-10-CM | POA: Insufficient documentation

## 2012-04-28 DIAGNOSIS — Z9861 Coronary angioplasty status: Secondary | ICD-10-CM | POA: Insufficient documentation

## 2012-04-28 DIAGNOSIS — I252 Old myocardial infarction: Secondary | ICD-10-CM | POA: Insufficient documentation

## 2012-04-28 DIAGNOSIS — I2510 Atherosclerotic heart disease of native coronary artery without angina pectoris: Secondary | ICD-10-CM | POA: Insufficient documentation

## 2012-04-28 MED ORDER — OXYCODONE-ACETAMINOPHEN 5-325 MG PO TABS: 2.0000 | ORAL_TABLET | ORAL | Status: DC | PRN

## 2012-04-28 MED ORDER — COLCHICINE 0.6 MG PO TABS: 0.6000 mg | ORAL_TABLET | Freq: Every day | ORAL | Status: DC

## 2012-04-28 MED ORDER — KETOROLAC TROMETHAMINE 60 MG/2ML IM SOLN
60.0000 mg | Freq: Once | INTRAMUSCULAR | Status: AC
  Administered 2012-04-28: 60 mg via INTRAMUSCULAR
  Filled 2012-04-28: qty 2

## 2012-04-28 NOTE — ED Notes (Signed)
Pt states he has a hx of gout and this is a flare-up. Had Colchicine x 3 at home, but wasn't enough.
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
## 2012-05-02 ENCOUNTER — Encounter (HOSPITAL_COMMUNITY): Payer: Self-pay | Admitting: Emergency Medicine

## 2012-05-02 ENCOUNTER — Emergency Department (HOSPITAL_COMMUNITY)
Admission: EM | Admit: 2012-05-02 | Discharge: 2012-05-02 | Disposition: A | Discharge status: Home / Self Care | Payer: Medicaid Other | Attending: Emergency Medicine | Admitting: Emergency Medicine

## 2012-05-02 DIAGNOSIS — I10 Essential (primary) hypertension: Secondary | ICD-10-CM | POA: Insufficient documentation

## 2012-05-02 DIAGNOSIS — G8929 Other chronic pain: Secondary | ICD-10-CM | POA: Insufficient documentation

## 2012-05-02 DIAGNOSIS — Z95810 Presence of automatic (implantable) cardiac defibrillator: Secondary | ICD-10-CM | POA: Insufficient documentation

## 2012-05-02 DIAGNOSIS — Z951 Presence of aortocoronary bypass graft: Secondary | ICD-10-CM | POA: Insufficient documentation

## 2012-05-02 DIAGNOSIS — M549 Dorsalgia, unspecified: Secondary | ICD-10-CM | POA: Insufficient documentation

## 2012-05-02 DIAGNOSIS — M109 Gout, unspecified: Secondary | ICD-10-CM | POA: Insufficient documentation

## 2012-05-02 DIAGNOSIS — I252 Old myocardial infarction: Secondary | ICD-10-CM | POA: Insufficient documentation

## 2012-05-02 DIAGNOSIS — E785 Hyperlipidemia, unspecified: Secondary | ICD-10-CM | POA: Insufficient documentation

## 2012-05-02 DIAGNOSIS — I2510 Atherosclerotic heart disease of native coronary artery without angina pectoris: Secondary | ICD-10-CM | POA: Insufficient documentation

## 2012-05-02 MED ORDER — OXYCODONE-ACETAMINOPHEN 5-325 MG PO TABS: 1.0000 | ORAL_TABLET | Freq: Four times a day (QID) | ORAL | Status: AC | PRN

## 2012-05-02 MED ORDER — OXYCODONE-ACETAMINOPHEN 5-325 MG PO TABS
1.0000 | ORAL_TABLET | Freq: Once | ORAL | Status: AC
  Administered 2012-05-02: 1 via ORAL
  Filled 2012-05-02: qty 1

## 2012-05-02 NOTE — ED Notes (Signed)
Per pt, has chronic back pain-tried to get into pain clinic but doesn't have picture id
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
## 2012-05-02 NOTE — Discharge Instructions (Signed)
Back Exercises Back exercises help treat and prevent back injuries. The goal of back exercises is to increase the strength of your abdominal and back muscles and the flexibility of your back. These exercises should be started when you no longer have back pain. Back exercises include:  Pelvic Tilt. Lie on your back with your knees bent. Tilt your pelvis until the lower part of your back is against the floor. Hold this position 5 to 10 sec and repeat 5 to 10 times.   Knee to Chest. Pull first 1 knee up against your chest and hold for 20 to 30 seconds, repeat this with the other knee, and then both knees. This may be done with the other leg straight or bent, whichever feels better.   Sit-Ups or Curl-Ups. Bend your knees 90 degrees. Start with tilting your pelvis, and do a partial, slow sit-up, lifting your trunk only 30 to 45 degrees off the floor. Take at least 2 to 3 seconds for each sit-up. Do not do sit-ups with your knees out straight. If partial sit-ups are difficult, simply do the above but with only tightening your abdominal muscles and holding it as directed.   Hip-Lift. Lie on your back with your knees flexed 90 degrees. Push down with your feet and shoulders as you raise your hips a couple inches off the floor; hold for 10 seconds, repeat 5 to 10 times.   Back arches. Lie on your stomach, propping yourself up on bent elbows. Slowly press on your hands, causing an arch in your low back. Repeat 3 to 5 times. Any initial stiffness and discomfort should lessen with repetition over time.   Shoulder-Lifts. Lie face down with arms beside your body. Keep hips and torso pressed to floor as you slowly lift your head and shoulders off the floor.  Do not overdo your exercises, especially in the beginning. Exercises may cause you some mild back discomfort which lasts for a few minutes; however, if the pain is more severe, or lasts for more than 15 minutes, do not continue exercises until you see your  caregiver. Improvement with exercise therapy for back problems is slow.  See your caregivers for assistance with developing a proper back exercise program. Document Released: 11/30/2004 Document Revised: 10/12/2011 Document Reviewed: 10/23/2005 [employer] Patient Information 2012 [employer]. 

## 2012-05-03 NOTE — ED Provider Notes (Signed)
Medical screening examination/treatment/procedure(s) were performed by non-physician practitioner and as supervising physician I was immediately available for consultation/collaboration.    Costantino Kohlbeck L Ryenn Howeth, MD 05/03/12 0006 

## 2012-05-13 ENCOUNTER — Encounter (HOSPITAL_BASED_OUTPATIENT_CLINIC_OR_DEPARTMENT_OTHER): Payer: Self-pay | Admitting: Family Medicine

## 2012-05-13 ENCOUNTER — Emergency Department (HOSPITAL_BASED_OUTPATIENT_CLINIC_OR_DEPARTMENT_OTHER)
Admission: EM | Admit: 2012-05-13 | Discharge: 2012-05-13 | Disposition: A | Discharge status: Home / Self Care | Payer: Medicaid Other | Attending: Emergency Medicine | Admitting: Emergency Medicine

## 2012-05-13 DIAGNOSIS — I10 Essential (primary) hypertension: Secondary | ICD-10-CM | POA: Insufficient documentation

## 2012-05-13 DIAGNOSIS — I252 Old myocardial infarction: Secondary | ICD-10-CM | POA: Insufficient documentation

## 2012-05-13 DIAGNOSIS — F17200 Nicotine dependence, unspecified, uncomplicated: Secondary | ICD-10-CM | POA: Insufficient documentation

## 2012-05-13 DIAGNOSIS — G8929 Other chronic pain: Secondary | ICD-10-CM

## 2012-05-13 DIAGNOSIS — M545 Low back pain, unspecified: Secondary | ICD-10-CM | POA: Insufficient documentation

## 2012-05-13 DIAGNOSIS — M549 Dorsalgia, unspecified: Secondary | ICD-10-CM

## 2012-05-13 DIAGNOSIS — E785 Hyperlipidemia, unspecified: Secondary | ICD-10-CM | POA: Insufficient documentation

## 2012-05-13 DIAGNOSIS — Z950 Presence of cardiac pacemaker: Secondary | ICD-10-CM | POA: Insufficient documentation

## 2012-05-13 DIAGNOSIS — F411 Generalized anxiety disorder: Secondary | ICD-10-CM | POA: Insufficient documentation

## 2012-05-13 DIAGNOSIS — Z7982 Long term (current) use of aspirin: Secondary | ICD-10-CM | POA: Insufficient documentation

## 2012-05-13 DIAGNOSIS — M109 Gout, unspecified: Secondary | ICD-10-CM | POA: Insufficient documentation

## 2012-05-13 MED ORDER — DICLOFENAC SODIUM 75 MG PO TBEC: 75.0000 mg | DELAYED_RELEASE_TABLET | Freq: Two times a day (BID) | ORAL | Status: DC

## 2012-05-13 MED ORDER — PREDNISONE (PAK) 10 MG PO TABS: ORAL_TABLET | ORAL | Status: AC

## 2012-05-13 MED ORDER — CYCLOBENZAPRINE HCL 10 MG PO TABS: 10.0000 mg | ORAL_TABLET | Freq: Two times a day (BID) | ORAL | Status: AC | PRN

## 2012-05-13 NOTE — ED Provider Notes (Signed)
Medical screening examination/treatment/procedure(s) were performed by non-physician practitioner and as supervising physician I was immediately available for consultation/collaboration.   Rolan Bucco, MD 05/13/12 [phone]
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
## 2012-05-13 NOTE — ED Notes (Addendum)
Pt c/o low back pain after lifting washer and dryer x 2 days. Pt sts "I'm waiting on my [place] ID to get here so I can get in at the pain clinic". Pt sts "I'm just trying to make it until then". Pt sts pain radiating down left leg. Pt reports significant back history.

## 2012-06-01 ENCOUNTER — Emergency Department (HOSPITAL_COMMUNITY)
Admission: EM | Admit: 2012-06-01 | Discharge: 2012-06-01 | Disposition: A | Discharge status: Home / Self Care | Payer: Medicaid Other | Attending: Emergency Medicine | Admitting: Emergency Medicine

## 2012-06-01 ENCOUNTER — Encounter (HOSPITAL_COMMUNITY): Payer: Self-pay

## 2012-06-01 DIAGNOSIS — Z95810 Presence of automatic (implantable) cardiac defibrillator: Secondary | ICD-10-CM | POA: Insufficient documentation

## 2012-06-01 DIAGNOSIS — G8929 Other chronic pain: Secondary | ICD-10-CM | POA: Insufficient documentation

## 2012-06-01 DIAGNOSIS — I252 Old myocardial infarction: Secondary | ICD-10-CM | POA: Insufficient documentation

## 2012-06-01 DIAGNOSIS — I2510 Atherosclerotic heart disease of native coronary artery without angina pectoris: Secondary | ICD-10-CM | POA: Insufficient documentation

## 2012-06-01 DIAGNOSIS — I10 Essential (primary) hypertension: Secondary | ICD-10-CM | POA: Insufficient documentation

## 2012-06-01 DIAGNOSIS — E785 Hyperlipidemia, unspecified: Secondary | ICD-10-CM | POA: Insufficient documentation

## 2012-06-01 DIAGNOSIS — M109 Gout, unspecified: Secondary | ICD-10-CM | POA: Insufficient documentation

## 2012-06-01 DIAGNOSIS — Z9861 Coronary angioplasty status: Secondary | ICD-10-CM | POA: Insufficient documentation

## 2012-06-01 DIAGNOSIS — M549 Dorsalgia, unspecified: Secondary | ICD-10-CM | POA: Insufficient documentation

## 2012-06-01 MED ORDER — OXYCODONE-ACETAMINOPHEN 5-325 MG PO TABS
2.0000 | ORAL_TABLET | Freq: Once | ORAL | Status: AC
  Administered 2012-06-01: 2 via ORAL
  Filled 2012-06-01: qty 2
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
## 2012-06-01 NOTE — ED Notes (Signed)
Pt with low back pain in center of lower back, abd pain worse when he vomits

## 2012-06-22 ENCOUNTER — Emergency Department (HOSPITAL_BASED_OUTPATIENT_CLINIC_OR_DEPARTMENT_OTHER): Payer: Medicaid Other

## 2012-06-22 ENCOUNTER — Encounter (HOSPITAL_BASED_OUTPATIENT_CLINIC_OR_DEPARTMENT_OTHER): Payer: Self-pay | Admitting: *Deleted

## 2012-06-22 ENCOUNTER — Emergency Department (HOSPITAL_BASED_OUTPATIENT_CLINIC_OR_DEPARTMENT_OTHER)
Admission: EM | Admit: 2012-06-22 | Discharge: 2012-06-23 | Disposition: A | Discharge status: Home / Self Care | Payer: Medicaid Other | Attending: Emergency Medicine | Admitting: Emergency Medicine

## 2012-06-22 DIAGNOSIS — E785 Hyperlipidemia, unspecified: Secondary | ICD-10-CM | POA: Insufficient documentation

## 2012-06-22 DIAGNOSIS — M109 Gout, unspecified: Secondary | ICD-10-CM | POA: Insufficient documentation

## 2012-06-22 DIAGNOSIS — M25579 Pain in unspecified ankle and joints of unspecified foot: Secondary | ICD-10-CM | POA: Insufficient documentation

## 2012-06-22 DIAGNOSIS — G8929 Other chronic pain: Secondary | ICD-10-CM | POA: Insufficient documentation

## 2012-06-22 DIAGNOSIS — I10 Essential (primary) hypertension: Secondary | ICD-10-CM | POA: Insufficient documentation

## 2012-06-22 DIAGNOSIS — I252 Old myocardial infarction: Secondary | ICD-10-CM | POA: Insufficient documentation

## 2012-06-22 DIAGNOSIS — Z950 Presence of cardiac pacemaker: Secondary | ICD-10-CM | POA: Insufficient documentation

## 2012-06-22 DIAGNOSIS — I2510 Atherosclerotic heart disease of native coronary artery without angina pectoris: Secondary | ICD-10-CM | POA: Insufficient documentation

## 2012-06-22 MED ORDER — TRAMADOL HCL 50 MG PO TABS
50.0000 mg | ORAL_TABLET | Freq: Once | ORAL | Status: AC
  Administered 2012-06-22: 50 mg via ORAL
  Filled 2012-06-22: qty 1

## 2012-06-22 NOTE — ED Notes (Signed)
Pt c/o back and bilat ankle pain for 2-3 days. Hx gout, "but doesn't feel the same". Seen at [facility] and referred to [facility], but can't get in until Sept. Took muscle relaxer and now "breathing is off"
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
## 2012-06-23 MED ORDER — TRAMADOL HCL 50 MG PO TABS: 50.0000 mg | ORAL_TABLET | Freq: Four times a day (QID) | ORAL | Status: AC | PRN

## 2012-06-26 ENCOUNTER — Emergency Department (HOSPITAL_COMMUNITY): Payer: Medicaid Other

## 2012-06-26 ENCOUNTER — Encounter (HOSPITAL_COMMUNITY): Payer: Self-pay | Admitting: Emergency Medicine

## 2012-06-26 ENCOUNTER — Inpatient Hospital Stay (HOSPITAL_COMMUNITY)
Admission: EM | Admit: 2012-06-26 | Discharge: 2012-06-28 | DRG: 287 | Disposition: A | Discharge status: Home / Self Care | Payer: Medicaid Other | Attending: Internal Medicine | Admitting: Internal Medicine

## 2012-06-26 DIAGNOSIS — M549 Dorsalgia, unspecified: Secondary | ICD-10-CM | POA: Diagnosis present

## 2012-06-26 DIAGNOSIS — I471 Supraventricular tachycardia, unspecified: Secondary | ICD-10-CM | POA: Diagnosis present

## 2012-06-26 DIAGNOSIS — G8929 Other chronic pain: Secondary | ICD-10-CM | POA: Diagnosis present

## 2012-06-26 DIAGNOSIS — E669 Obesity, unspecified: Secondary | ICD-10-CM

## 2012-06-26 DIAGNOSIS — K859 Acute pancreatitis without necrosis or infection, unspecified: Secondary | ICD-10-CM | POA: Diagnosis present

## 2012-06-26 DIAGNOSIS — I213 ST elevation (STEMI) myocardial infarction of unspecified site: Secondary | ICD-10-CM | POA: Diagnosis present

## 2012-06-26 DIAGNOSIS — Z6841 Body Mass Index (BMI) 40.0 and over, adult: Secondary | ICD-10-CM

## 2012-06-26 DIAGNOSIS — I255 Ischemic cardiomyopathy: Secondary | ICD-10-CM | POA: Diagnosis present

## 2012-06-26 DIAGNOSIS — I200 Unstable angina: Secondary | ICD-10-CM | POA: Diagnosis present

## 2012-06-26 DIAGNOSIS — I2510 Atherosclerotic heart disease of native coronary artery without angina pectoris: Secondary | ICD-10-CM | POA: Diagnosis present

## 2012-06-26 DIAGNOSIS — R079 Chest pain, unspecified: Secondary | ICD-10-CM | POA: Diagnosis present

## 2012-06-26 DIAGNOSIS — I2589 Other forms of chronic ischemic heart disease: Secondary | ICD-10-CM | POA: Diagnosis present

## 2012-06-26 DIAGNOSIS — Z9861 Coronary angioplasty status: Secondary | ICD-10-CM

## 2012-06-26 DIAGNOSIS — F17200 Nicotine dependence, unspecified, uncomplicated: Secondary | ICD-10-CM | POA: Diagnosis present

## 2012-06-26 DIAGNOSIS — Z95810 Presence of automatic (implantable) cardiac defibrillator: Secondary | ICD-10-CM | POA: Diagnosis present

## 2012-06-26 DIAGNOSIS — I10 Essential (primary) hypertension: Secondary | ICD-10-CM | POA: Diagnosis present

## 2012-06-26 LAB — LIPASE, BLOOD: Lipase: 76 U/L — ABNORMAL HIGH (ref 11–59)

## 2012-06-26 LAB — POCT I-STAT TROPONIN I

## 2012-06-26 LAB — HEPATIC FUNCTION PANEL
AST: 29 U/L (ref 0–37)
Albumin: 4.1 g/dL (ref 3.5–5.2)
Alkaline Phosphatase: 47 U/L (ref 39–117)
Total Bilirubin: 0.3 mg/dL (ref 0.3–1.2)

## 2012-06-26 LAB — CBC
HCT: 50.4 % (ref 39.0–52.0)
Hemoglobin: 18 g/dL — ABNORMAL HIGH (ref 13.0–17.0)
MCHC: 35.7 g/dL (ref 30.0–36.0)
WBC: 15.8 10*3/uL — ABNORMAL HIGH (ref 4.0–10.5)

## 2012-06-26 LAB — CARDIAC PANEL(CRET KIN+CKTOT+MB+TROPI)
CK, MB: 2.5 ng/mL (ref 0.3–4.0)
Troponin I: 0.6 ng/mL (ref <0.30)

## 2012-06-26 LAB — BASIC METABOLIC PANEL
Chloride: 101 mEq/L (ref 96–112)
GFR calc Af Amer: >90 mL/min (ref >90)
GFR calc non Af Amer: >90 mL/min (ref >90)
Glucose, Bld: 116 mg/dL — ABNORMAL HIGH (ref 70–99)
Potassium: 4.5 mEq/L (ref 3.5–5.1)
Sodium: 135 mEq/L (ref 135–145)

## 2012-06-26 MED ORDER — HEPARIN (PORCINE) IN NACL 100-0.45 UNIT/ML-% IJ SOLN: 1000.0000 [IU]/h | INTRAMUSCULAR | Status: DC

## 2012-06-26 MED ORDER — HEPARIN BOLUS VIA INFUSION: 4000.0000 [IU] | Freq: Once | INTRAVENOUS | Status: DC

## 2012-06-26 MED ORDER — NITROGLYCERIN 0.4 MG SL SUBL: 0.4000 mg | SUBLINGUAL_TABLET | SUBLINGUAL | Status: DC | PRN

## 2012-06-26 MED ORDER — HYDROCODONE-ACETAMINOPHEN 5-325 MG PO TABS
1.0000 | ORAL_TABLET | Freq: Four times a day (QID) | ORAL | Status: DC | PRN
  Administered 2012-06-26 – 2012-06-28 (×8): 2 via ORAL
  Filled 2012-06-26 (×8): qty 2

## 2012-06-26 MED ORDER — ONDANSETRON HCL 4 MG PO TABS
4.0000 mg | ORAL_TABLET | Freq: Four times a day (QID) | ORAL | Status: DC | PRN
  Filled 2012-06-26: qty 1

## 2012-06-26 MED ORDER — PRASUGREL HCL 10 MG PO TABS
10.0000 mg | ORAL_TABLET | Freq: Every day | ORAL | Status: DC
  Administered 2012-06-27 – 2012-06-28 (×2): 10 mg via ORAL
  Filled 2012-06-26 (×2): qty 1

## 2012-06-26 MED ORDER — HEPARIN (PORCINE) IN NACL 100-0.45 UNIT/ML-% IJ SOLN
1700.0000 [IU]/h | INTRAMUSCULAR | Status: DC
  Administered 2012-06-26: 1500 [IU]/h via INTRAVENOUS
  Administered 2012-06-27: 1700 [IU]/h via INTRAVENOUS
  Filled 2012-06-26 (×3): qty 250

## 2012-06-26 MED ORDER — ONDANSETRON HCL 4 MG/2ML IJ SOLN
4.0000 mg | Freq: Once | INTRAMUSCULAR | Status: AC
  Administered 2012-06-26: 4 mg via INTRAVENOUS
  Filled 2012-06-26: qty 2

## 2012-06-26 MED ORDER — ASPIRIN EC 81 MG PO TBEC: 81.0000 mg | DELAYED_RELEASE_TABLET | Freq: Every day | ORAL | Status: DC

## 2012-06-26 MED ORDER — ONDANSETRON HCL 4 MG/2ML IJ SOLN: 4.0000 mg | Freq: Four times a day (QID) | INTRAMUSCULAR | Status: DC | PRN

## 2012-06-26 MED ORDER — SODIUM CHLORIDE 0.9 % IJ SOLN: 3.0000 mL | Freq: Two times a day (BID) | INTRAMUSCULAR | Status: DC

## 2012-06-26 MED ORDER — LISINOPRIL 10 MG PO TABS
10.0000 mg | ORAL_TABLET | Freq: Every day | ORAL | Status: DC
  Filled 2012-06-26 (×2): qty 1

## 2012-06-26 MED ORDER — TRAMADOL HCL 50 MG PO TABS: 50.0000 mg | ORAL_TABLET | Freq: Four times a day (QID) | ORAL | Status: DC | PRN

## 2012-06-26 MED ORDER — METOPROLOL TARTRATE 25 MG PO TABS
25.0000 mg | ORAL_TABLET | Freq: Two times a day (BID) | ORAL | Status: DC
  Administered 2012-06-26 – 2012-06-27 (×2): 25 mg via ORAL
  Filled 2012-06-26 (×3): qty 1

## 2012-06-26 MED ORDER — FUROSEMIDE 20 MG PO TABS
20.0000 mg | ORAL_TABLET | Freq: Every day | ORAL | Status: DC
  Filled 2012-06-26: qty 1

## 2012-06-26 MED ORDER — SODIUM CHLORIDE 0.9 % IJ SOLN
3.0000 mL | Freq: Two times a day (BID) | INTRAMUSCULAR | Status: DC
  Administered 2012-06-26: 3 mL via INTRAVENOUS

## 2012-06-26 MED ORDER — NITROGLYCERIN 0.4 MG/HR TD PT24
0.4000 mg | MEDICATED_PATCH | Freq: Every day | TRANSDERMAL | Status: DC
  Filled 2012-06-26: qty 1

## 2012-06-26 MED ORDER — ZOLPIDEM TARTRATE 5 MG PO TABS
10.0000 mg | ORAL_TABLET | Freq: Every evening | ORAL | Status: DC | PRN
  Administered 2012-06-26: 10 mg via ORAL
  Filled 2012-06-26: qty 2

## 2012-06-26 MED ORDER — ACETAMINOPHEN 650 MG RE SUPP: 650.0000 mg | Freq: Four times a day (QID) | RECTAL | Status: DC | PRN

## 2012-06-26 MED ORDER — ACETAMINOPHEN 325 MG PO TABS
650.0000 mg | ORAL_TABLET | Freq: Four times a day (QID) | ORAL | Status: DC | PRN
  Administered 2012-06-26: 650 mg via ORAL
  Filled 2012-06-26: qty 2

## 2012-06-26 MED ORDER — SIMVASTATIN 40 MG PO TABS
40.0000 mg | ORAL_TABLET | Freq: Every evening | ORAL | Status: DC
  Administered 2012-06-26: 40 mg via ORAL
  Filled 2012-06-26 (×3): qty 1

## 2012-06-26 MED ORDER — HEPARIN BOLUS VIA INFUSION
5000.0000 [IU] | Freq: Once | INTRAVENOUS | Status: AC
  Administered 2012-06-26: 5000 [IU] via INTRAVENOUS

## 2012-06-26 NOTE — Progress Notes (Signed)
CRITICAL VALUE ALERT  Critical value received:  Troponin 0.6  Date of notification:  06/26/2012   Time of notification:  10:13 PM   Critical value read back:yes  Nurse who received alert:  Nathanial Rancher, RN   MD notified (1st page):  Corey Harold  Time of first page:  10:13 PM   MD notified (2nd page):  Time of second page:  Responding MD:  Corey Harold  Time MD responded:  10:18 PM

## 2012-06-26 NOTE — ED Notes (Signed)
Pt c/o midsternal CP with SOB, diaphoresis and N/V starting last night and became more severe today
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
## 2012-06-26 NOTE — ED Notes (Addendum)
Pt reports mid-sternum chest pain, indigestion, generalized abd pain, pt reports LLQ pain last night, N/V/D, dizziness, and diaphoresis. Pt reports back pain when abd is palpated. Pt reports symptoms feel similar to his previous MI. Pt reports hx of diverticulitis.
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
## 2012-06-26 NOTE — ED Notes (Signed)
Patient transported to X-ray 

## 2012-06-27 ENCOUNTER — Encounter (HOSPITAL_COMMUNITY)
Admission: EM | Disposition: A | Discharge status: Home / Self Care | Payer: Self-pay | Source: Home / Self Care | Attending: Internal Medicine

## 2012-06-27 DIAGNOSIS — K859 Acute pancreatitis without necrosis or infection, unspecified: Secondary | ICD-10-CM | POA: Diagnosis present

## 2012-06-27 HISTORY — PX: CARDIAC CATHETERIZATION: SHX172

## 2012-06-27 HISTORY — PX: LEFT HEART CATHETERIZATION WITH CORONARY ANGIOGRAM: SHX5451

## 2012-06-27 LAB — COMPREHENSIVE METABOLIC PANEL
CO2: 24 mEq/L (ref 19–32)
Calcium: 9.2 mg/dL (ref 8.4–10.5)
Creatinine, Ser: 1.05 mg/dL (ref 0.50–1.35)
GFR calc Af Amer: >90 mL/min (ref >90)
GFR calc non Af Amer: 85 mL/min — ABNORMAL LOW (ref >90)
Glucose, Bld: 101 mg/dL — ABNORMAL HIGH (ref 70–99)

## 2012-06-27 LAB — CARDIAC PANEL(CRET KIN+CKTOT+MB+TROPI)
CK, MB: 2.4 ng/mL (ref 0.3–4.0)
Relative Index: INVALID (ref 0.0–2.5)
Troponin I: 0.56 ng/mL (ref <0.30)

## 2012-06-27 LAB — CBC
HCT: 44.1 % (ref 39.0–52.0)
Hemoglobin: 15.6 g/dL (ref 13.0–17.0)
MCH: 32.9 pg (ref 26.0–34.0)
MCHC: 35.4 g/dL (ref 30.0–36.0)

## 2012-06-27 LAB — CREATININE, SERUM: Creatinine, Ser: 0.95 mg/dL (ref 0.50–1.35)

## 2012-06-27 LAB — CBC WITH DIFFERENTIAL/PLATELET
Hemoglobin: 15.9 g/dL (ref 13.0–17.0)
Lymphs Abs: 2.7 10*3/uL (ref 0.7–4.0)
Monocytes Relative: 9 % (ref 3–12)
Neutro Abs: 7.2 10*3/uL (ref 1.7–7.7)
Neutrophils Relative %: 65 % (ref 43–77)
RBC: 4.92 MIL/uL (ref 4.22–5.81)

## 2012-06-27 LAB — HEPARIN LEVEL (UNFRACTIONATED)
Heparin Unfractionated: 0.29 IU/mL — ABNORMAL LOW (ref 0.30–0.70)
Heparin Unfractionated: 0.62 IU/mL (ref 0.30–0.70)

## 2012-06-27 LAB — AMYLASE: Amylase: 225 U/L — ABNORMAL HIGH (ref 0–105)

## 2012-06-27 LAB — GLUCOSE, CAPILLARY
Glucose-Capillary: 116 mg/dL — ABNORMAL HIGH (ref 70–99)
Glucose-Capillary: 115 mg/dL — ABNORMAL HIGH (ref 70–99)

## 2012-06-27 LAB — LIPASE, BLOOD: Lipase: 210 U/L — ABNORMAL HIGH (ref 11–59)

## 2012-06-27 SURGERY — LEFT HEART CATHETERIZATION WITH CORONARY ANGIOGRAM
Anesthesia: LOCAL

## 2012-06-27 MED ORDER — SODIUM CHLORIDE 0.9 % IV SOLN
1.0000 mL/kg/h | INTRAVENOUS | Status: AC
  Administered 2012-06-27: 18:00:00 1 mL/kg/h via INTRAVENOUS

## 2012-06-27 MED ORDER — ASPIRIN 81 MG PO CHEW
324.0000 mg | CHEWABLE_TABLET | ORAL | Status: AC
  Administered 2012-06-27: 324 mg via ORAL
  Filled 2012-06-27: qty 4

## 2012-06-27 MED ORDER — METOPROLOL TARTRATE 25 MG PO TABS
37.5000 mg | ORAL_TABLET | Freq: Two times a day (BID) | ORAL | Status: DC
  Administered 2012-06-27 – 2012-06-28 (×2): 37.5 mg via ORAL
  Filled 2012-06-27 (×4): qty 1

## 2012-06-27 MED ORDER — SODIUM CHLORIDE 0.9 % IJ SOLN: 3.0000 mL | INTRAMUSCULAR | Status: DC | PRN

## 2012-06-27 MED ORDER — ACETAMINOPHEN 325 MG PO TABS: 650.0000 mg | ORAL_TABLET | ORAL | Status: DC | PRN

## 2012-06-27 MED ORDER — ALPRAZOLAM 0.25 MG PO TABS
0.5000 mg | ORAL_TABLET | Freq: Three times a day (TID) | ORAL | Status: DC | PRN
  Administered 2012-06-27 – 2012-06-28 (×3): 0.5 mg via ORAL
  Filled 2012-06-27: qty 1
  Filled 2012-06-27: qty 2
  Filled 2012-06-27 (×2): qty 1

## 2012-06-27 MED ORDER — NITROGLYCERIN 0.4 MG SL SUBL
SUBLINGUAL_TABLET | SUBLINGUAL | Status: AC
  Filled 2012-06-27: qty 25

## 2012-06-27 MED ORDER — SODIUM CHLORIDE 0.9 % IV SOLN: 250.0000 mL | INTRAVENOUS | Status: DC | PRN

## 2012-06-27 MED ORDER — ONDANSETRON HCL 4 MG/2ML IJ SOLN: 4.0000 mg | Freq: Four times a day (QID) | INTRAMUSCULAR | Status: DC | PRN

## 2012-06-27 MED ORDER — FENTANYL CITRATE 0.05 MG/ML IJ SOLN
INTRAMUSCULAR | Status: AC
  Filled 2012-06-27: qty 2

## 2012-06-27 MED ORDER — SODIUM CHLORIDE 0.9 % IV SOLN: 250.0000 mL | INTRAVENOUS | Status: DC

## 2012-06-27 MED ORDER — MIDAZOLAM HCL 2 MG/2ML IJ SOLN
INTRAMUSCULAR | Status: AC
  Filled 2012-06-27: qty 2

## 2012-06-27 MED ORDER — SODIUM CHLORIDE 0.9 % IJ SOLN: 3.0000 mL | Freq: Two times a day (BID) | INTRAMUSCULAR | Status: DC

## 2012-06-27 MED ORDER — HEPARIN (PORCINE) IN NACL 2-0.9 UNIT/ML-% IJ SOLN
INTRAMUSCULAR | Status: AC
  Filled 2012-06-27: qty 2000

## 2012-06-27 MED ORDER — DIAZEPAM 5 MG PO TABS
5.0000 mg | ORAL_TABLET | ORAL | Status: AC
  Administered 2012-06-27: 5 mg via ORAL
  Filled 2012-06-27: qty 1

## 2012-06-27 MED ORDER — HEPARIN BOLUS VIA INFUSION
1500.0000 [IU] | Freq: Once | INTRAVENOUS | Status: AC
  Administered 2012-06-27: 1500 [IU] via INTRAVENOUS
  Filled 2012-06-27: qty 1500

## 2012-06-27 MED ORDER — ASPIRIN EC 81 MG PO TBEC
81.0000 mg | DELAYED_RELEASE_TABLET | Freq: Every day | ORAL | Status: DC
  Administered 2012-06-28: 81 mg via ORAL
  Filled 2012-06-27: qty 1

## 2012-06-27 MED ORDER — PANTOPRAZOLE SODIUM 40 MG PO TBEC
40.0000 mg | DELAYED_RELEASE_TABLET | Freq: Every day | ORAL | Status: DC
  Administered 2012-06-27 – 2012-06-28 (×2): 40 mg via ORAL
  Filled 2012-06-27 (×2): qty 1

## 2012-06-27 MED ORDER — HEPARIN SODIUM (PORCINE) 5000 UNIT/ML IJ SOLN
5000.0000 [IU] | Freq: Three times a day (TID) | INTRAMUSCULAR | Status: DC
  Administered 2012-06-28: 5000 [IU] via SUBCUTANEOUS
  Filled 2012-06-27 (×5): qty 1

## 2012-06-27 MED ORDER — SODIUM CHLORIDE 0.45 % IV SOLN
INTRAVENOUS | Status: DC
  Administered 2012-06-27: 75 mL/h via INTRAVENOUS

## 2012-06-27 MED ORDER — LIDOCAINE HCL (PF) 1 % IJ SOLN
INTRAMUSCULAR | Status: AC
  Filled 2012-06-27: qty 30

## 2012-06-27 MED ORDER — NITROGLYCERIN 0.2 MG/ML ON CALL CATH LAB
INTRAVENOUS | Status: AC
  Filled 2012-06-27: qty 1

## 2012-06-27 MED ORDER — SODIUM CHLORIDE 0.9 % IV SOLN: 1.0000 mL/kg/h | INTRAVENOUS | Status: DC

## 2012-06-27 MED ORDER — NITROGLYCERIN 0.6 MG/HR TD PT24
0.6000 mg | MEDICATED_PATCH | Freq: Every day | TRANSDERMAL | Status: DC
  Filled 2012-06-27: qty 1
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
## 2012-06-27 NOTE — CV Procedure (Addendum)
Gerald Simpson,Gerald Simpson Male, 46 y.o., 08-Oct-1966  Location: [facility]  Bed: NONE  MRN: [id number]  CSN: [id number]  CARDIAC CATHETERIZATION REPORT   Procedures performed:  1. Left heart catheterization  2. Selective coronary angiography   Reason for procedure:  Unstable angina pectoris   Procedure performed by: Thurmon Fair, MD, [facility]  Complications: none   Estimated blood loss: less than 5 mL   History:  This 46 year old gentleman with early onset CAD and history of stents to LAD and LCX arteries (most recently PCI for proximal LAD in stent restenosis in February 2013)  Consent: The risks, benefits, and details of the procedure were explained to the patient. Risks including death, MI, stroke, bleeding, limb ischemia, renal failure and allergy were described and accepted by the patient. Informed written consent was obtained prior to proceeding.  Technique: The patient was brought to the cardiac catheterization laboratory in the fasting state. He was prepped and draped in the usual sterile fashion. Local anesthesia with 1% lidocaine was administered to the right wrist area. Using the modified Seldinger technique a 5 French right radial artery sheath was introduced without difficulty. Under fluoroscopic guidance, using 5 [place] TIG catheter, selective cannulation of the left coronary artery andright coronary artery was performed. Several coronary angiograms in a variety of projections were recorded. The same catheter was used to cross the aortic valve in order to record left ventricular pressure and a pull back to the aorta. No immediate complications occurred. At the end of the procedure, all catheters were removed. After the procedure, hemostasis will be achieved with local pressure/TR band.  Contrast used: 75 mL Omnipaque Verapamil 5 mg, nitroglycerin 200 mcg, lidocaine !% 2 mL via radial sheath Versed 2 mg IV and Fentanyl 250 mcg IV  Angiographic Findings:   1. The left main coronary  artery is free of significant atherosclerosis and bifurcates in the usual fashion into the left anterior descending artery and left circumflex coronary artery.  2. The left anterior descending artery is a large vessel that reaches the apex and generates three small to medium diagonal branches. There is evidence of moderate luminal irregularities and no calcification. The proximal LAD stent is widely patent without meaningful restenosis. There is a 80-90% stenosis in the most distal portion of the anterior course of the LAD artery. The artery continues around the apex and perfuses the inferoapical wall. It is probably less than 1 mm in diameter. There is TIMI3 flow. Moderate disease is also seen in the second diagonal artery (also probably a 1 mm diameter vessel). 3. The left circumflex coronary artery is a very large-size dominant vessel that generates three major oblque marginal arteries, as well as several small posterolateral arteries and a relatively small posterior descending artery. A previously placed stent in the mid vessel is widel patent without restenosis, but there is some mild "jailing" of the first oblique marginal artery. No hemodynamically meaningful stenoses are seen. 4. The right coronary artery is a very small-size non dominant vessel that generates only the infundibular artery and a right ventricular branch. It is moderately diseased. 5. The left ventricle was not injected. There is no aortic valve stenosis by pullback. The left ventricular end-diastolic pressure is 17 mm Hg.    IMPRESSIONS:  The most likely "culprit" lesion is the stenosis in the distal LAD artery, which is not amenable to revascularization due to its small size.   RECOMMENDATION:  Medical therapy: increase betablocker and nitrate dosage. Continue Effient and aspirin. OK for discharge  in AM if angina free.     Thurmon Fair, MD, [facility] [phone] office [phone]  pager 06/27/2012. 5:08 PM

## 2012-06-27 NOTE — Progress Notes (Signed)
ANTICOAGULATION CONSULT NOTE - Follow Up Consult  Pharmacy Consult for heparin Indication: chest pain/ACS  Allergies  Allergen Reactions  . Morphine And Related Other (See Comments)    Patient does not want to take this medication and it causes severe constipation.  . Other     Cannot take decongestants due to Cardiac history    Patient Measurements: Height: 5\' 9"  (175.3 cm) Weight: 281 lb (127.461 kg) IBW/kg (Calculated) : 70.7    Vital Signs: Temp: 97.8 F (36.6 C) (08/22 0412) Temp src: Oral (08/22 0412) BP: 111/82 mmHg (08/22 1045) Pulse Rate: 61  (08/22 1045)  Labs:  Gerald Simpson 06/27/12 0923 06/27/12 0515 06/27/12 0215 06/26/12 2059 06/26/12 1840  HGB -- 15.9 -- -- 18.0*  HCT -- 45.8 -- -- 50.4  PLT -- 239 -- -- 307  APTT -- -- -- -- --  LABPROT 15.0 -- -- -- --  INR 1.16 -- -- -- --  HEPARINUNFRC 0.62 -- 0.29* -- --  CREATININE -- 1.05 -- -- 0.99  CKTOTAL -- 97 -- 92 --  CKMB -- 2.4 -- 2.5 --  TROPONINI -- 0.57* -- 0.60* --    Estimated Creatinine Clearance: 118.6 ml/min (by C-G formula based on Cr of 1.05).  Assessment: Patient is a 46 y.o. M on heparin for CP with plan for cardiac cath today.  Heparin level is at goal after rate increased to 1700 units/hr this morning.  Goal of Therapy:  Heparin level 0.3-0.7 units/ml Monitor platelets by anticoagulation protocol: Yes   Plan:  1) no change for heparin rate 2) f/u after cath  Melyssa Signor P 06/27/2012,11:24 AM

## 2012-06-27 NOTE — Progress Notes (Signed)
Pt had 8 beats of V-tach at 0152. Went back to NSR. Asymptomatic and VSS. EKG obtained. Placed strip on the file. Will continue to monitor.

## 2012-06-27 NOTE — Progress Notes (Signed)
[facility] PROGRESS NOTE  Gerald Simpson HQI:[id number] DOB: Jan 14, 1966 DOA: 06/26/2012 PCP: Lennette Bihari, MD  Assessment/Plan: Principal Problem:  *Chest pain, epigastric pain Active Problems:  STEMI, LAD DES 12/30/11  CAD, Hx of prior stents LAD and CFX 2005 in [place]  ICD in place, BS, implanted 2007, one discharge am of STEMI 2/13  PSVT 2/13, briefly on Amiodarone  Ischemic cardiomyopathy, EF 40%, 2D Feb 2013  Chronic back pain  1. Unstable angina: on heparin gtt. Cardiology on board and going for cath later today.  2. Epigastric pain: admitted to evaluate for ACS. Also found to have elevated lipase and amylase. Will get an ultrasound abdomen to evaluate the GB , pancreas and cholelithiasis. Continue with NPO and fluids. Pain control, anti emetics and PPI.  3. ISCM: further management as per Cardiology. Continue with heparin.  4. Mild leukocytosis: probably reactive, repeat labs in am. UA pending, CXR negative for pneumonia.  5. Hypertension: controlled.   Code Status: full code Family Communication: none at bedside Disposition Plan: pending.    Brief narrative: 46 year old male who has had a recent stent placed in February 2013 after an ST elevation MI with previous history of ischemic cardiomyopathy with EF measured 40% in February 2013, AICD placement, hypertension and gout presented to the ER because of persistent epigastric pain. Patient started having feeling of indigestion since last night. Today he became more pronounced with sweating and that one episode nausea vomiting. Patient also was mildly short of breath. In the ER EKG shows nonspecific ST-T changes and cardiac enzymes were negative. Given his strong cardiac history patient has been admitted for possible unstable angina   Consultants:  cardiology  Procedures:  Going for cath today.  Antibiotics:  none  HPI/Subjective: No complaints of chest pain.   Objective: Filed Vitals:   06/27/12 0412 06/27/12  1045 06/27/12 1415 06/27/12 1546  BP: 115/80 111/82 112/78 117/80  Pulse: 64 61 62 63  Temp: 97.8 F (36.6 C)  98.2 F (36.8 C) 97.7 F (36.5 C)  TempSrc: Oral   Oral  Resp: 18  18 18   Height: 5\' 9"  (1.753 m)     Weight: 127.461 kg (281 lb)     SpO2: 97%  97% 96%    Intake/Output Summary (Last 24 hours) at 06/27/12 1559 Last data filed at 06/27/12 1500  Gross per 24 hour  Intake  42.78 ml  Output   1550 ml  Net -1507.22 ml   Filed Weights   06/26/12 2037 06/26/12 2212 06/27/12 0412  Weight: 127 kg (279 lb 15.8 oz) 127.143 kg (280 lb 4.8 oz) 127.461 kg (281 lb)    Exam:     Data Reviewed: Basic Metabolic Panel:  Lab 06/27/12 1324 06/26/12 1840  NA 135 135  K 4.5 4.5  CL 101 101  CO2 24 21  GLUCOSE 101* 116*  BUN 14 15  CREATININE 1.05 0.99  CALCIUM 9.2 9.7  MG -- --  PHOS -- --   Liver Function Tests:  Lab 06/27/12 0515 06/26/12 2100  AST 29 29  ALT 46 51  ALKPHOS 43 47  BILITOT 0.4 0.3  PROT 7.3 8.1  ALBUMIN 3.8 4.1    Lab 06/27/12 0923 06/26/12 2100  LIPASE 210* 76*  AMYLASE 225* --   No results found for this basename: AMMONIA:5 in the last 168 hours CBC:  Lab 06/27/12 0515 06/26/12 1840  WBC 11.0* 15.8*  NEUTROABS 7.2 --  HGB 15.9 18.0*  HCT 45.8 50.4  MCV 93.1  92.3  PLT 239 307   Cardiac Enzymes:  Lab 06/27/12 1210 06/27/12 0515 06/26/12 2059  CKTOTAL 92 97 92  CKMB 2.3 2.4 2.5  CKMBINDEX -- -- --  TROPONINI 0.56* 0.57* 0.60*   BNP (last 3 results)  Basename 01/09/12 2200 01/01/12 0630  PROBNP 478.1* 1176.0*   CBG:  Lab 06/27/12 1551 06/27/12 1108 06/27/12 0552  GLUCAP 92 115* 116*    Recent Results (from the past 240 hour(s))  MRSA PCR SCREENING     Status: Normal   Collection Time   06/26/12  9:03 PM      Component Value Range Status Comment   MRSA by PCR NEGATIVE  NEGATIVE Final      Studies: Dg Chest 2 View  06/26/2012  *RADIOLOGY REPORT*  Clinical Data: Chest pain and short of breath  CHEST - 2 VIEW   Comparison: 04/06/2012  Findings: AICD is unchanged.  Left coronary stent is noted.  Heart size is normal.  Negative for heart failure or pneumonia. Lungs are clear  IMPRESSION: No active cardiopulmonary disease.   Original Report Authenticated By: Camelia Phenes, M.D.    Dg Ankle Complete Left  06/22/2012  *RADIOLOGY REPORT*  Clinical Data: Ankle pain.  No known injury.  LEFT ANKLE COMPLETE - 3+ VIEW  Comparison: None.  Findings: The left ankle appears intact. No evidence of acute fracture or subluxation.  No focal bone lesions.  Bone matrix and cortex appear intact.  No abnormal radiopaque densities in the soft tissues.  IMPRESSION: No acute bony abnormalities.  Original Report Authenticated By: Marlon Pel, M.D.   Dg Ankle Complete Right  06/22/2012  *RADIOLOGY REPORT*  Clinical Data: Ankle pain.  No known injury.  RIGHT ANKLE - COMPLETE 3+ VIEW  Comparison: None.  Findings: The right ankle appears intact. No evidence of acute fracture or subluxation.  No focal bone lesions.  Bone matrix and cortex appear intact.  No abnormal radiopaque densities in the soft tissues.  Small plantar and Achilles calcaneal spurs.  IMPRESSION: No acute bony abnormalities.  Original Report Authenticated By: Marlon Pel, M.D.    Scheduled Meds:   . aspirin  324 mg Oral Pre-Cath  . aspirin EC  81 mg Oral Daily  . diazepam  5 mg Oral On Call  . heparin      . heparin  1,500 Units Intravenous Once  . heparin  5,000 Units Intravenous Once  . lidocaine      . metoprolol tartrate  25 mg Oral BID  . nitroGLYCERIN  0.4 mg Transdermal Daily  . nitroGLYCERIN      . ondansetron  4 mg Intravenous Once  . pantoprazole  40 mg Oral Q0600  . prasugrel  10 mg Oral Daily  . simvastatin  40 mg Oral QPM  . sodium chloride  3 mL Intravenous Q12H  . sodium chloride  3 mL Intravenous Q12H  . DISCONTD: aspirin EC  81 mg Oral Daily  . DISCONTD: furosemide  20 mg Oral Daily  . DISCONTD: heparin  4,000 Units  Intravenous Once  . DISCONTD: lisinopril  10 mg Oral QHS  . DISCONTD: sodium chloride  3 mL Intravenous Q12H   Continuous Infusions:   . sodium chloride 75 mL/hr (06/27/12 1051)  . sodium chloride    . heparin Stopped (06/27/12 1543)  . DISCONTD: heparin      Principal Problem:  *Chest pain, epigastric pain Active Problems:  STEMI, LAD DES 12/30/11  CAD, Hx of prior stents LAD  and CFX 2005 in [place]  ICD in place, BS, implanted 2007, one discharge am of STEMI 2/13  PSVT 2/13, briefly on Amiodarone  Ischemic cardiomyopathy, EF 40%, 2D Feb 2013  Chronic back pain        [facility]  [facility] Pager [phone]. If 8PM-8AM, please contact night-coverage at [email or web address], password [facility] 06/27/2012, 3:59 PM  LOS: 1 day
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
## 2012-06-27 NOTE — Progress Notes (Signed)
TR BAND REMOVAL  LOCATION:    right radial  DEFLATED PER PROTOCOL:    yes  TIME BAND OFF / DRESSING APPLIED:    2130   SITE AFTER BAND REMOVAL:    Level 0  REVERSE ALLEN'S TEST:     positive  CIRCULATION SENSATION AND MOVEMENT:    Within Normal Limits   yes  COMMENTS:   Day RN had to re-instill 3 cc air at one point during deflation.  Small amt bruising and puffiness proximal to TR site.  TR band removed per protocol and area cleansed w/ CHG wipes and redressed w/ 2x2/tegaderm.  Post radial cath instructions reviewed w/ pt, instruction sheet given.

## 2012-06-27 NOTE — ED Provider Notes (Signed)
I saw and evaluated the patient, reviewed the resident's note and I agree with the findings and plan.  Estiben Mizuno, MD 06/27/12 1603 

## 2012-06-27 NOTE — Progress Notes (Signed)
Well known to our service, this young man has had numerous coronary events and revascularization procedures, most recently 6 months ago. He presents with unstable angina over about 24h but is now asymptomatic. There is a marginal increase in the cTnI. His ECG shows very subtle ST segment elevation, but this is seen in leads with well defined Q waves of previous infarction. Plan to proceed to coronary angiography in AM. Meanwhile dual antiplatelet, anticoagulant and beta blocker therapy are appropriate.  Will retrieve office records. Complete note to follow in a few hours.  Thurmon Fair, MD, [facility] [phone] office [phone] pager 06/27/2012 12:38 AM
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
## 2012-06-28 ENCOUNTER — Inpatient Hospital Stay (HOSPITAL_COMMUNITY): Payer: Medicaid Other

## 2012-06-28 DIAGNOSIS — K859 Acute pancreatitis without necrosis or infection, unspecified: Secondary | ICD-10-CM

## 2012-06-28 DIAGNOSIS — E669 Obesity, unspecified: Secondary | ICD-10-CM

## 2012-06-28 LAB — CBC
HCT: 43.5 % (ref 39.0–52.0)
MCV: 93.3 fL (ref 78.0–100.0)
RDW: 12.8 % (ref 11.5–15.5)
WBC: 9.9 10*3/uL (ref 4.0–10.5)

## 2012-06-28 LAB — LIPASE, BLOOD: Lipase: 59 U/L (ref 11–59)

## 2012-06-28 MED ORDER — LISINOPRIL 10 MG PO TABS: 10.0000 mg | ORAL_TABLET | Freq: Every day | ORAL | Status: DC

## 2012-06-28 MED ORDER — METOPROLOL TARTRATE 25 MG PO TABS: 37.5000 mg | ORAL_TABLET | Freq: Two times a day (BID) | ORAL | Status: DC

## 2012-06-28 MED ORDER — PANTOPRAZOLE SODIUM 40 MG PO TBEC: 40.0000 mg | DELAYED_RELEASE_TABLET | Freq: Every day | ORAL | Status: DC

## 2012-06-28 MED ORDER — CLOPIDOGREL BISULFATE 75 MG PO TABS: 75.0000 mg | ORAL_TABLET | Freq: Every day | ORAL | Status: DC

## 2012-06-28 MED ORDER — ISOSORBIDE MONONITRATE ER 30 MG PO TB24: 30.0000 mg | ORAL_TABLET | Freq: Every day | ORAL | Status: DC

## 2012-06-28 MED ORDER — HYDROCODONE-ACETAMINOPHEN 5-325 MG PO TABS: 1.0000 | ORAL_TABLET | Freq: Four times a day (QID) | ORAL | Status: AC | PRN

## 2012-06-28 MED ORDER — ISOSORBIDE MONONITRATE ER 30 MG PO TB24
30.0000 mg | ORAL_TABLET | Freq: Every day | ORAL | Status: DC
  Administered 2012-06-28: 30 mg via ORAL
  Filled 2012-06-28: qty 1

## 2012-06-28 NOTE — Progress Notes (Signed)
06/28/12 1625 Tera Mater, RN, BSN NCM [phone] In to speak with pt. about medication assistance.  Pt. states he cannot afford his Effient.  Dr. Herbie Baltimore stated in his notes pt. could switch back to Plavix if needed.  TC to [place], [place] to order Plavix due to pt. unable to afford Effient.  Pt. to discharge today.

## 2012-06-28 NOTE — Discharge Summary (Signed)
Physician Discharge Summary  Gerald Simpson WUJ:[id number] DOB: 11/27/1965 DOA: 06/26/2012  PCP: Lennette Bihari, MD  Admit date: 06/26/2012 Discharge date: 06/28/2012  Recommendations for Outpatient Follow-up:  1. Follow up with cardiology as recommended.   Discharge Diagnoses:  Principal Problem:  *Chest pain, epigastric pain, due to NSTMI Active Problems:  STEMI, LAD DES 12/30/11  CAD, Hx of prior stents LAD and CFX 2005 in [place]  ICD in place, BS, implanted 2007, one discharge am of STEMI 2/13  PSVT 2/13, briefly on Amiodarone  Ischemic cardiomyopathy, EF 40%, 2D Feb 2013  Chronic back pain  Pancreatitis   Discharge Condition: stable.   Diet recommendation: heart healthy diet  Filed Weights   06/26/12 2212 06/27/12 0412 06/28/12 0452  Weight: 127.143 kg (280 lb 4.8 oz) 127.461 kg (281 lb) 127 kg (279 lb 15.8 oz)    History of present illness:  46 year old male who has had a recent stent placed in February 2013 after an ST elevation MI with previous history of ischemic cardiomyopathy with EF measured 40% in February 2013, AICD placement, hypertension and gout presented to the ER because of persistent epigastric pain. Patient started having feeling of indigestion since last night. Today he became more pronounced with sweating and that one episode nausea vomiting. Patient also was mildly short of breath. In the ER EKG shows nonspecific ST-T changes and cardiac enzymes were negative. Given his strong cardiac history patient has been admitted for possible unstable angina. Patient is presently chest pain-free   Hospital Course:  1. Unstable angina:  He was initially started on heparin gtt. Cardiology consult called and he was taken to left heart cath and coronary angiography on 8/22, shows patent stents. His medications were adjusted and he was stable to be discharged from cardiac standpoint. He was discharged on effient,but patient reported that he wont be able to afford it and might  probably not fill the prescription, and hence it was changed to plavix. Case management to help him with his prescriptions.   2. Epigastric pain: admitted to evaluate for ACS. Also found to have elevated lipase and amylase. W he probably had mild pancreatitis. He got fluids and was npo for 24 hrs, his epigastric pain resolved and repeat lipase levels were normal.  ultrasound abdomen did not show cholelithiasis.  He was advised to stop smoking.  3. ISCM: further management as per Cardiology as outpatient.  4. Mild leukocytosis: probably reactive initially which have resolved on repeat labs,. UA neg for infection.  CXR negative for pneumonia.  5. Hypertension: controlled.    Procedures:  Cardiac cath.   Consultations:  cardiology  Discharge Exam: Filed Vitals:   06/28/12 1222  BP: 158/91  Pulse: 80  Temp: 97.5 F (36.4 C)  Resp: 13   Filed Vitals:   06/28/12 0020 06/28/12 0452 06/28/12 0726 06/28/12 1222  BP: 100/53 125/85 113/74 158/91  Pulse:   60 80  Temp: 98.1 F (36.7 C) 97.9 F (36.6 C) 97.7 F (36.5 C) 97.5 F (36.4 C)  TempSrc: Oral Oral Oral Oral  Resp: 14 16 13 13   Height:      Weight:  127 kg (279 lb 15.8 oz)    SpO2: 99% 99% 99% 99%   Alert afebrile slightly anxious  Cardiovascular: Normal rate and regular rhythm.  Respiratory: Effort normal and breath sounds normal. No respiratory distress. He has no wheezes. He has no rales.  GI: Soft. Bowel sounds are normal. He exhibits no distension. There is no tenderness. There is  no rebound.  Musculoskeletal: Normal range of motion. He exhibits no edema and no tenderness.  Neurological: He is alert and oriented to person, place, and time   Discharge Instructions  Discharge Orders    Future Orders Please Complete By Expires   Diet - low sodium heart healthy      Discharge instructions      Comments:   Follow up with cardiology and PCP, as recommended.   Activity as tolerated - No restrictions         Medication List  As of 06/28/2012  3:59 PM   TAKE these medications         aspirin EC 81 MG tablet   Take 81 mg by mouth daily.      calcium carbonate 500 MG chewable tablet   Commonly known as: TUMS - dosed in mg elemental calcium   Chew 2 tablets by mouth 3 (three) times daily as needed. For indigestion      furosemide 20 MG tablet   Commonly known as: LASIX   Take 20 mg by mouth daily.      HYDROcodone-acetaminophen 5-325 MG per tablet   Commonly known as: NORCO/VICODIN   Take 1-2 tablets by mouth every 6 (six) hours as needed.      isosorbide mononitrate 30 MG 24 hr tablet   Commonly known as: IMDUR   Take 1 tablet (30 mg total) by mouth daily.      lisinopril 10 MG tablet   Commonly known as: PRINIVIL,ZESTRIL   Take 10 mg by mouth at bedtime.      metoprolol tartrate 25 MG tablet   Commonly known as: LOPRESSOR   Take 1.5 tablets (37.5 mg total) by mouth 2 (two) times daily.      nitroGLYCERIN 0.4 MG SL tablet   Commonly known as: NITROSTAT   Place 0.4 mg under the tongue every 5 (five) minutes as needed. For chest pain      pantoprazole 40 MG tablet   Commonly known as: PROTONIX   Take 1 tablet (40 mg total) by mouth daily at 6 (six) AM.      prasugrel 10 MG Tabs   Commonly known as: EFFIENT   Take 10 mg by mouth daily.      simvastatin 40 MG tablet   Commonly known as: ZOCOR   Take 40 mg by mouth every evening.      traMADol 50 MG tablet   Commonly known as: ULTRAM   Take 1 tablet (50 mg total) by mouth every 6 (six) hours as needed for pain.           Follow-up Information    Follow up with Abelino Derrick, PA on 07/12/2012. (at 2:30 pm  for follow up with Dr. Landry Dyke PA)    Contact information:   [address] [place] [address] [phone]           The results of significant diagnostics from this hospitalization (including imaging, microbiology, ancillary and laboratory) are listed below for reference.     Significant Diagnostic Studies: Dg Chest 2 View  06/26/2012  *RADIOLOGY REPORT*  Clinical Data: Chest pain and short of breath  CHEST - 2 VIEW  Comparison: 04/06/2012  Findings: AICD is unchanged.  Left coronary stent is noted.  Heart size is normal.  Negative for heart failure or pneumonia. Lungs are clear  IMPRESSION: No active cardiopulmonary disease.   Original Report Authenticated By: Camelia Phenes, M.D.    Dg  Ankle Complete Left  06/22/2012  *RADIOLOGY REPORT*  Clinical Data: Ankle pain.  No known injury.  LEFT ANKLE COMPLETE - 3+ VIEW  Comparison: None.  Findings: The left ankle appears intact. No evidence of acute fracture or subluxation.  No focal bone lesions.  Bone matrix and cortex appear intact.  No abnormal radiopaque densities in the soft tissues.  IMPRESSION: No acute bony abnormalities.  Original Report Authenticated By: Marlon Pel, M.D.   Dg Ankle Complete Right  06/22/2012  *RADIOLOGY REPORT*  Clinical Data: Ankle pain.  No known injury.  RIGHT ANKLE - COMPLETE 3+ VIEW  Comparison: None.  Findings: The right ankle appears intact. No evidence of acute fracture or subluxation.  No focal bone lesions.  Bone matrix and cortex appear intact.  No abnormal radiopaque densities in the soft tissues.  Small plantar and Achilles calcaneal spurs.  IMPRESSION: No acute bony abnormalities.  Original Report Authenticated By: Marlon Pel, M.D.   US Abdomen Complete  06/28/2012  *RADIOLOGY REPORT*  Clinical Data:  Chest and epigastric pain, elevated malaise and right base, history coronary artery disease post MI, ischemic cardiomyopathy, AICD, hypertension, gout  ULTRASOUND ABDOMEN:  Technique:  Sonography of upper abdominal structures was performed. Degradation of image quality secondary to body habitus.  Comparison:  None  Gallbladder:  Normally distended without stones or wall thickening. No pericholecystic fluid or sonographic Murphy sign.  Common bile duct:  Normal caliber 3 mm  diameter  Liver:  Echogenic, question fatty infiltration, though this can be seen with cirrhosis and certain infiltrative disorders.  Poor sound transmission through echogenic parenchyma with suboptimal visualization of intrahepatic anatomy/detail.  Small cyst noted within right lobe 10 x 9 x 8 mm.  No other definite hepatic abnormalities identified, though not excluded.  Questionable nodular contours of hepatic parenchyma, note images 29 - 30 anteriorly.  IVC:  Normal appearance  Pancreas:  Obscured by bowel gas  Spleen:  Normal appearance, 8.2 cm length  Right kidney:  11.4 cm length. Normal morphology without mass or hydronephrosis.  Left kidney:  12.9 cm length. Normal morphology without mass or hydronephrosis.  Small nonshadowing linear echogenic focus at mid left kidney, cannot exclude small nonshadowing nonobstructing calculus.  Aorta:  Predominately obscured due to bowel gas  Other:  No free fluid  IMPRESSION: Nonvisualization of pancreas and aorta due to bowel gas. Echogenic liver which could reflect fatty infiltration or cirrhosis. No evidence of cholelithiasis or biliary dilatation. Consider CT abdomen with IV and oral contrast for better pancreatic assessment.   Original Report Authenticated By: Lollie Marrow, M.D.     Microbiology: Recent Results (from the past 240 hour(s))  MRSA PCR SCREENING     Status: Normal   Collection Time   06/26/12  9:03 PM      Component Value Range Status Comment   MRSA by PCR NEGATIVE  NEGATIVE Final      Labs: Basic Metabolic Panel:  Lab 06/27/12 1610 06/27/12 0515 06/26/12 1840  NA -- 135 135  K -- 4.5 4.5  CL -- 101 101  CO2 -- 24 21  GLUCOSE -- 101* 116*  BUN -- 14 15  CREATININE 0.95 1.05 0.99  CALCIUM -- 9.2 9.7  MG -- -- --  PHOS -- -- --   Liver Function Tests:  Lab 06/27/12 0515 06/26/12 2100  AST 29 29  ALT 46 51  ALKPHOS 43 47  BILITOT 0.4 0.3  PROT 7.3 8.1  ALBUMIN 3.8 4.1  Lab 06/28/12 0527 06/27/12 0923 06/26/12 2100    LIPASE 59 210* 76*  AMYLASE -- 225* --   No results found for this basename: AMMONIA:5 in the last 168 hours CBC:  Lab 06/28/12 0527 06/27/12 1743 06/27/12 0515 06/26/12 1840  WBC 9.9 11.1* 11.0* 15.8*  NEUTROABS -- -- 7.2 --  HGB 14.9 15.6 15.9 18.0*  HCT 43.5 44.1 45.8 50.4  MCV 93.3 93.0 93.1 92.3  PLT 224 255 239 307   Cardiac Enzymes:  Lab 06/27/12 1210 06/27/12 0515 06/26/12 2059  CKTOTAL 92 97 92  CKMB 2.3 2.4 2.5  CKMBINDEX -- -- --  TROPONINI 0.56* 0.57* 0.60*   BNP: BNP (last 3 results)  Basename 01/09/12 2200 01/01/12 0630  PROBNP 478.1* 1176.0*   CBG:  Lab 06/27/12 1551 06/27/12 1108 06/27/12 0552  GLUCAP 92 115* 116*      Signed:  Ivelise Castillo  [facility] 06/28/2012, 3:59 PM

## 2012-06-28 NOTE — Progress Notes (Signed)
Pt does not smoke but uses about 1 can every 2 days of chewing tobacco.  Discussed effects on health, tips for quitting, nicotine gum as temporary replacement to aid quitting.  Pt voices understanding but voices no desire to quit.

## 2012-06-28 NOTE — Progress Notes (Signed)
Subjective: No documented chest pain.  Objective: Vital signs in last 24 hours: Temp:  [97.3 F (36.3 C)-98.2 F (36.8 C)] 97.7 F (36.5 C) (08/23 0726) Pulse Rate:  [50-83] 60  (08/23 0726) Resp:  [13-18] 13  (08/23 0726) BP: (100-157)/(53-93) 113/74 mmHg (08/23 0726) SpO2:  [96 %-100 %] 99 % (08/23 0726) Weight:  [127 kg (279 lb 15.8 oz)] 127 kg (279 lb 15.8 oz) (08/23 0452) Weight change: 0 kg (0 lb) Last BM Date: 06/27/12 Intake/Output from previous day:-1335 08/22 0701 - 08/23 0700 In: 1390 [P.O.:720; I.V.:670] Out: 2725 [Urine:2725] Intake/Output this shift:    PE: General appearance: alert, cooperative, appears stated age, no distress and morbidly obese Neck: no adenopathy, no carotid bruit, no JVD, supple, symmetrical, trachea midline and thyroid not enlarged, symmetric, no tenderness/mass/nodules Lungs: clear to auscultation bilaterally, normal percussion bilaterally and non-labored Heart: regular rate and rhythm, S1, S2 normal, no murmur, click, rub or gallop Abdomen: soft, non-tender; bowel sounds normal; no masses,  no organomegaly and truncal obesity Extremities: extremities normal, atraumatic, no cyanosis or edema and R ankle sore / tender to palaption. Pulses: 2+ and symmetric Neurologic: Cranial nerves: normal    Lab Results:  Basename 06/28/12 0527 06/27/12 1743  WBC 9.9 11.1*  HGB 14.9 15.6  HCT 43.5 44.1  PLT 224 255   BMET  Basename 06/27/12 1743 06/27/12 0515 06/26/12 1840  NA -- 135 135  K -- 4.5 4.5  CL -- 101 101  CO2 -- 24 21  GLUCOSE -- 101* 116*  BUN -- 14 15  CREATININE 0.95 1.05 --  CALCIUM -- 9.2 9.7    Basename 06/27/12 1210 06/27/12 0515  TROPONINI 0.56* 0.57*    Lab Results  Component Value Date   CHOL 156 01/02/2012   HDL 27* 01/02/2012   LDLCALC 78 01/02/2012   TRIG 253* 01/02/2012   CHOLHDL 5.8 01/02/2012   Lab Results  Component Value Date   HGBA1C 5.6 12/30/2011     Lab Results  Component Value Date   TSH 0.366  12/30/2011    Hepatic Function Panel  Basename 06/27/12 0515 06/26/12 2100  PROT 7.3 --  ALBUMIN 3.8 --  AST 29 --  ALT 46 --  ALKPHOS 43 --  BILITOT 0.4 --  BILIDIR -- <0.1  IBILI -- NOT CALCULATED     Studies/Results: Dg Chest 2 View  06/26/2012  *RADIOLOGY REPORT*  Clinical Data: Chest pain and short of breath  CHEST - 2 VIEW  Comparison: 04/06/2012  Findings: AICD is unchanged.  Left coronary stent is noted.  Heart size is normal.  Negative for heart failure or pneumonia. Lungs are clear  IMPRESSION: No active cardiopulmonary disease.   Original Report Authenticated By: Camelia Phenes, M.D.    06/27/12: IMPRESSIONS:  The most likely "culprit" lesion is the stenosis in the distal LAD artery, which is not amenable to revascularization due to its small size.  RECOMMENDATION:  Medical therapy: increase betablocker and nitrate dosage. Continue Effient and aspirin.  OK for discharge in AM if angina free  Medications: I have reviewed the patient's current medications.    Marland Kitchen aspirin EC  81 mg Oral Daily  . diazepam  5 mg Oral On Call  . fentaNYL      . heparin      . heparin  5,000 Units Subcutaneous Q8H  . lidocaine      . metoprolol tartrate  37.5 mg Oral BID  . midazolam      .  nitroGLYCERIN  0.6 mg Transdermal Daily  . nitroGLYCERIN      . pantoprazole  40 mg Oral Q0600  . prasugrel  10 mg Oral Daily  . simvastatin  40 mg Oral QPM  . sodium chloride  3 mL Intravenous Q12H  . sodium chloride  3 mL Intravenous Q12H  . sodium chloride  3 mL Intravenous Q12H  . DISCONTD: furosemide  20 mg Oral Daily  . DISCONTD: lisinopril  10 mg Oral QHS  . DISCONTD: metoprolol tartrate  25 mg Oral BID  . DISCONTD: nitroGLYCERIN  0.4 mg Transdermal Daily   Assessment/Plan: Principal Problem:  *Chest pain, epigastric pain, due to NSTMI Active Problems:  STEMI, LAD DES 12/30/11  CAD, Hx of prior stents LAD and CFX 2005 in [place]  ICD in place, BS, implanted 2007, one discharge am of STEMI  2/13  PSVT 2/13, briefly on Amiodarone  Ischemic cardiomyopathy, EF 40%, 2D Feb 2013  Chronic back pain  Pancreatitis  PLAN: Lopressor increased. Labs stable.  PK troponin 0.56 BP 113/74  This am. Ambulate and d/c home if no further chest pain.  MD to see to evaluate.  LOS: 2 days   INGOLD,LAURA R 06/28/2012, 8:38 AM  ATTENDING ATTESTATION:  I have seen and examined the patient along with Nada Boozer (PA, NP).  I have reviewed the chart, notes and new data.  I agree with Laura's findings, examination (my examination above) & recommendations as noted above.  Brief Description: This 46 year old gentleman with early onset CAD / Isc CM --history of stents to LAD and LCX arteries (most recently PCI for proximal LAD in stent restenosis in February 2013).  Admitted with Acute Coronary Syndrome - mild troponin elevation. Underwent cardiac catheterization yesterday & found to have distal LAD disease, no amenable to PCI -- plan Med Rx.  BB dose was increased & will start Imdur today. Continue with DAPT.  He is having some $$ difficulties with Effient - have consulted Case management to assist with Medication needs, He could change to Brilinta - preferred - or Plavix if necessary.  He states that he felt much better after the IV Heparin administration -- suggesting possible re-canalization.  No further CP overnight -- just troubled by chronic back pain.  Plan per [facility] is for Abd [place] today to eval for Gallstones given post-prandial abdominal pain.  He is otherwise stable for d/c from a cardiac standpoint on increased BB dose + long acting Nitrate, ACE-I, statin &  DAPT.  Marykay Lex, M.D., M.S. [facility] [address] [place], [place]  [address]  [phone]  06/28/2012 9:32 AM

## 2012-06-28 NOTE — Progress Notes (Signed)
He is unable to afford Brilinta, will switch to Plavix.  Corine Shelter PA-C 06/28/2012 4:32 PM

## 2012-08-12 ENCOUNTER — Encounter (HOSPITAL_BASED_OUTPATIENT_CLINIC_OR_DEPARTMENT_OTHER): Payer: Self-pay | Admitting: *Deleted

## 2012-08-12 ENCOUNTER — Emergency Department (HOSPITAL_BASED_OUTPATIENT_CLINIC_OR_DEPARTMENT_OTHER)
Admission: EM | Admit: 2012-08-12 | Discharge: 2012-08-12 | Disposition: A | Discharge status: Home / Self Care | Payer: Medicaid Other | Attending: Emergency Medicine | Admitting: Emergency Medicine

## 2012-08-12 DIAGNOSIS — I252 Old myocardial infarction: Secondary | ICD-10-CM | POA: Insufficient documentation

## 2012-08-12 DIAGNOSIS — Z950 Presence of cardiac pacemaker: Secondary | ICD-10-CM | POA: Insufficient documentation

## 2012-08-12 DIAGNOSIS — E785 Hyperlipidemia, unspecified: Secondary | ICD-10-CM | POA: Insufficient documentation

## 2012-08-12 DIAGNOSIS — Z9861 Coronary angioplasty status: Secondary | ICD-10-CM | POA: Insufficient documentation

## 2012-08-12 DIAGNOSIS — L600 Ingrowing nail: Secondary | ICD-10-CM | POA: Insufficient documentation

## 2012-08-12 DIAGNOSIS — I2510 Atherosclerotic heart disease of native coronary artery without angina pectoris: Secondary | ICD-10-CM | POA: Insufficient documentation

## 2012-08-12 DIAGNOSIS — I10 Essential (primary) hypertension: Secondary | ICD-10-CM | POA: Insufficient documentation

## 2012-08-12 DIAGNOSIS — M109 Gout, unspecified: Secondary | ICD-10-CM | POA: Insufficient documentation

## 2012-08-12 DIAGNOSIS — G8929 Other chronic pain: Secondary | ICD-10-CM | POA: Insufficient documentation

## 2012-08-12 MED ORDER — CEPHALEXIN 500 MG PO CAPS: 500.0000 mg | ORAL_CAPSULE | Freq: Four times a day (QID) | ORAL | Status: DC

## 2012-08-12 MED ORDER — BUPIVACAINE HCL 0.5 % IJ SOLN
50.0000 mL | Freq: Once | INTRAMUSCULAR | Status: DC
  Filled 2012-08-12: qty 1

## 2012-08-12 MED ORDER — HYDROCODONE-ACETAMINOPHEN 5-500 MG PO TABS: 1.0000 | ORAL_TABLET | Freq: Four times a day (QID) | ORAL | Status: DC | PRN

## 2012-08-12 NOTE — ED Notes (Signed)
Pt c/o right big toe pain with redness x 3 days
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
## 2012-08-12 NOTE — ED Notes (Signed)
Pt reports right big toe infection and pain x 4 days. States that he has to "pop" the nail bed and drain pus from site couple of times a day. States that selling and pain has now spread to the entire foot. Right foot swollen, hot to the touch. Good pedal pulse and pt able to move foot and wiggle toes without difficulty. Pt states pain is 9/10 and has not taken any pain medicine for the pain.

## 2012-08-13 NOTE — ED Provider Notes (Signed)
Medical screening examination/treatment/procedure(s) were performed by non-physician practitioner and as supervising physician I was immediately available for consultation/collaboration.  Ethelda Chick, MD 08/13/12 1515

## 2012-10-08 ENCOUNTER — Emergency Department (HOSPITAL_COMMUNITY): Payer: Medicaid Other

## 2012-10-08 ENCOUNTER — Encounter (HOSPITAL_COMMUNITY): Payer: Self-pay | Admitting: Emergency Medicine

## 2012-10-08 ENCOUNTER — Emergency Department (HOSPITAL_COMMUNITY)
Admission: EM | Admit: 2012-10-08 | Discharge: 2012-10-08 | Disposition: A | Discharge status: Home / Self Care | Payer: Medicaid Other | Attending: Emergency Medicine | Admitting: Emergency Medicine

## 2012-10-08 DIAGNOSIS — Z8639 Personal history of other endocrine, nutritional and metabolic disease: Secondary | ICD-10-CM | POA: Insufficient documentation

## 2012-10-08 DIAGNOSIS — E785 Hyperlipidemia, unspecified: Secondary | ICD-10-CM | POA: Insufficient documentation

## 2012-10-08 DIAGNOSIS — I10 Essential (primary) hypertension: Secondary | ICD-10-CM | POA: Insufficient documentation

## 2012-10-08 DIAGNOSIS — M545 Low back pain, unspecified: Secondary | ICD-10-CM | POA: Insufficient documentation

## 2012-10-08 DIAGNOSIS — Z79899 Other long term (current) drug therapy: Secondary | ICD-10-CM | POA: Insufficient documentation

## 2012-10-08 DIAGNOSIS — R109 Unspecified abdominal pain: Secondary | ICD-10-CM | POA: Insufficient documentation

## 2012-10-08 DIAGNOSIS — Z7982 Long term (current) use of aspirin: Secondary | ICD-10-CM | POA: Insufficient documentation

## 2012-10-08 DIAGNOSIS — Z8719 Personal history of other diseases of the digestive system: Secondary | ICD-10-CM | POA: Insufficient documentation

## 2012-10-08 DIAGNOSIS — Z8781 Personal history of (healed) traumatic fracture: Secondary | ICD-10-CM | POA: Insufficient documentation

## 2012-10-08 DIAGNOSIS — Z862 Personal history of diseases of the blood and blood-forming organs and certain disorders involving the immune mechanism: Secondary | ICD-10-CM | POA: Insufficient documentation

## 2012-10-08 DIAGNOSIS — G8929 Other chronic pain: Secondary | ICD-10-CM | POA: Insufficient documentation

## 2012-10-08 DIAGNOSIS — I252 Old myocardial infarction: Secondary | ICD-10-CM | POA: Insufficient documentation

## 2012-10-08 DIAGNOSIS — Z87828 Personal history of other (healed) physical injury and trauma: Secondary | ICD-10-CM | POA: Insufficient documentation

## 2012-10-08 DIAGNOSIS — I2510 Atherosclerotic heart disease of native coronary artery without angina pectoris: Secondary | ICD-10-CM | POA: Insufficient documentation

## 2012-10-08 LAB — COMPREHENSIVE METABOLIC PANEL
ALT: 33 U/L (ref 0–53)
AST: 28 U/L (ref 0–37)
Albumin: 4.2 g/dL (ref 3.5–5.2)
Calcium: 9.7 mg/dL (ref 8.4–10.5)
Chloride: 99 mEq/L (ref 96–112)
Creatinine, Ser: 0.91 mg/dL (ref 0.50–1.35)
Sodium: 138 mEq/L (ref 135–145)
Total Bilirubin: 0.3 mg/dL (ref 0.3–1.2)

## 2012-10-08 LAB — CBC WITH DIFFERENTIAL/PLATELET
Eosinophils Relative: 2 % (ref 0–5)
HCT: 48.9 % (ref 39.0–52.0)
Lymphocytes Relative: 27 % (ref 12–46)
Lymphs Abs: 2.8 10*3/uL (ref 0.7–4.0)
MCV: 89.9 fL (ref 78.0–100.0)
Monocytes Absolute: 0.9 10*3/uL (ref 0.1–1.0)
Monocytes Relative: 9 % (ref 3–12)
RBC: 5.44 MIL/uL (ref 4.22–5.81)
WBC: 10.2 10*3/uL (ref 4.0–10.5)

## 2012-10-08 LAB — POCT I-STAT TROPONIN I

## 2012-10-08 MED ORDER — MORPHINE SULFATE 4 MG/ML IJ SOLN
4.0000 mg | INTRAMUSCULAR | Status: DC | PRN
  Administered 2012-10-08: 4 mg via INTRAVENOUS
  Filled 2012-10-08: qty 1

## 2012-10-08 MED ORDER — IOHEXOL 300 MG/ML  SOLN
100.0000 mL | Freq: Once | INTRAMUSCULAR | Status: AC | PRN
  Administered 2012-10-08: 100 mL via INTRAVENOUS

## 2012-10-08 MED ORDER — SODIUM CHLORIDE 0.9 % IV SOLN
INTRAVENOUS | Status: DC
  Administered 2012-10-08: 20:00:00 via INTRAVENOUS

## 2012-10-08 MED ORDER — ONDANSETRON HCL 4 MG PO TABS: 4.0000 mg | ORAL_TABLET | Freq: Three times a day (TID) | ORAL | Status: DC | PRN

## 2012-10-08 MED ORDER — ONDANSETRON HCL 4 MG/2ML IJ SOLN
4.0000 mg | INTRAMUSCULAR | Status: AC | PRN
  Administered 2012-10-08 (×2): 4 mg via INTRAVENOUS
  Filled 2012-10-08 (×2): qty 2

## 2012-10-08 MED ORDER — FAMOTIDINE IN NACL 20-0.9 MG/50ML-% IV SOLN
20.0000 mg | Freq: Once | INTRAVENOUS | Status: AC
  Administered 2012-10-08: 20 mg via INTRAVENOUS
  Filled 2012-10-08: qty 50

## 2012-10-08 NOTE — ED Notes (Signed)
Reports "earache" since thanksgiving, ear pain subsided, now LUQ-LLQ abd pain radiating to mid back
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
## 2012-10-08 NOTE — ED Notes (Signed)
Patient with upper abdomen and back pain which started yesterday.  Pt has had nausea and vomiting which was helped by a prescription of zofran at home.  Pt was diagnosed with pancreatitis in August and reports his pain was similar to now.  Pt rates pain as a ten.  Denies chest pain.

## 2012-11-04 ENCOUNTER — Emergency Department (HOSPITAL_COMMUNITY): Payer: Medicaid Other

## 2012-11-04 ENCOUNTER — Inpatient Hospital Stay (HOSPITAL_COMMUNITY)
Admission: EM | Admit: 2012-11-04 | Discharge: 2012-11-06 | DRG: 282 | Disposition: A | Discharge status: Home / Self Care | Payer: Medicaid Other | Attending: Cardiovascular Disease | Admitting: Cardiovascular Disease

## 2012-11-04 DIAGNOSIS — R079 Chest pain, unspecified: Secondary | ICD-10-CM

## 2012-11-04 DIAGNOSIS — I252 Old myocardial infarction: Secondary | ICD-10-CM

## 2012-11-04 DIAGNOSIS — J449 Chronic obstructive pulmonary disease, unspecified: Secondary | ICD-10-CM | POA: Diagnosis present

## 2012-11-04 DIAGNOSIS — I214 Non-ST elevation (NSTEMI) myocardial infarction: Principal | ICD-10-CM | POA: Diagnosis present

## 2012-11-04 DIAGNOSIS — J45909 Unspecified asthma, uncomplicated: Secondary | ICD-10-CM

## 2012-11-04 DIAGNOSIS — J4489 Other specified chronic obstructive pulmonary disease: Secondary | ICD-10-CM | POA: Diagnosis present

## 2012-11-04 DIAGNOSIS — I10 Essential (primary) hypertension: Secondary | ICD-10-CM | POA: Diagnosis present

## 2012-11-04 DIAGNOSIS — R0602 Shortness of breath: Secondary | ICD-10-CM | POA: Diagnosis present

## 2012-11-04 DIAGNOSIS — M109 Gout, unspecified: Secondary | ICD-10-CM | POA: Diagnosis present

## 2012-11-04 DIAGNOSIS — K5730 Diverticulosis of large intestine without perforation or abscess without bleeding: Secondary | ICD-10-CM | POA: Diagnosis present

## 2012-11-04 DIAGNOSIS — F17200 Nicotine dependence, unspecified, uncomplicated: Secondary | ICD-10-CM | POA: Diagnosis present

## 2012-11-04 DIAGNOSIS — Z950 Presence of cardiac pacemaker: Secondary | ICD-10-CM

## 2012-11-04 DIAGNOSIS — R7989 Other specified abnormal findings of blood chemistry: Secondary | ICD-10-CM

## 2012-11-04 DIAGNOSIS — G8929 Other chronic pain: Secondary | ICD-10-CM | POA: Diagnosis present

## 2012-11-04 DIAGNOSIS — Z9861 Coronary angioplasty status: Secondary | ICD-10-CM

## 2012-11-04 DIAGNOSIS — F411 Generalized anxiety disorder: Secondary | ICD-10-CM | POA: Diagnosis present

## 2012-11-04 DIAGNOSIS — Z7982 Long term (current) use of aspirin: Secondary | ICD-10-CM

## 2012-11-04 DIAGNOSIS — R109 Unspecified abdominal pain: Secondary | ICD-10-CM

## 2012-11-04 DIAGNOSIS — Z79899 Other long term (current) drug therapy: Secondary | ICD-10-CM

## 2012-11-04 DIAGNOSIS — M549 Dorsalgia, unspecified: Secondary | ICD-10-CM | POA: Diagnosis present

## 2012-11-04 DIAGNOSIS — J209 Acute bronchitis, unspecified: Secondary | ICD-10-CM

## 2012-11-04 DIAGNOSIS — E669 Obesity, unspecified: Secondary | ICD-10-CM

## 2012-11-04 DIAGNOSIS — I2510 Atherosclerotic heart disease of native coronary artery without angina pectoris: Secondary | ICD-10-CM

## 2012-11-04 DIAGNOSIS — E785 Hyperlipidemia, unspecified: Secondary | ICD-10-CM | POA: Diagnosis present

## 2012-11-04 LAB — BASIC METABOLIC PANEL
CO2: 24 mEq/L (ref 19–32)
Chloride: 101 mEq/L (ref 96–112)
GFR calc Af Amer: 80 mL/min — ABNORMAL LOW (ref >90)
Potassium: 4 mEq/L (ref 3.5–5.1)

## 2012-11-04 LAB — LIPASE, BLOOD: Lipase: 26 U/L (ref 11–59)

## 2012-11-04 LAB — TROPONIN I: Troponin I: 0.67 ng/mL (ref <0.30)

## 2012-11-04 LAB — CBC
HCT: 43.6 % (ref 39.0–52.0)
MCV: 91 fL (ref 78.0–100.0)
Platelets: 218 10*3/uL (ref 150–400)
RBC: 4.79 MIL/uL (ref 4.22–5.81)
RDW: 12.7 % (ref 11.5–15.5)
WBC: 8.6 10*3/uL (ref 4.0–10.5)

## 2012-11-04 LAB — HEPATIC FUNCTION PANEL
Albumin: 4 g/dL (ref 3.5–5.2)
Total Bilirubin: 0.2 mg/dL — ABNORMAL LOW (ref 0.3–1.2)
Total Protein: 7.6 g/dL (ref 6.0–8.3)

## 2012-11-04 MED ORDER — ASPIRIN 81 MG PO CHEW
CHEWABLE_TABLET | ORAL | Status: AC
  Filled 2012-11-04: qty 2

## 2012-11-04 MED ORDER — GI COCKTAIL ~~LOC~~
30.0000 mL | Freq: Once | ORAL | Status: AC
  Administered 2012-11-04: 30 mL via ORAL
  Filled 2012-11-04: qty 30

## 2012-11-04 MED ORDER — ALBUTEROL SULFATE (5 MG/ML) 0.5% IN NEBU
5.0000 mg | INHALATION_SOLUTION | Freq: Once | RESPIRATORY_TRACT | Status: AC
  Administered 2012-11-04: 5 mg via RESPIRATORY_TRACT
  Filled 2012-11-04: qty 1

## 2012-11-04 MED ORDER — PREDNISONE 20 MG PO TABS
60.0000 mg | ORAL_TABLET | Freq: Once | ORAL | Status: AC
  Administered 2012-11-04: 60 mg via ORAL
  Filled 2012-11-04: qty 2
  Filled 2012-11-04: qty 1

## 2012-11-04 MED ORDER — NITROGLYCERIN 0.4 MG SL SUBL
0.4000 mg | SUBLINGUAL_TABLET | SUBLINGUAL | Status: DC | PRN
  Administered 2012-11-04: 0.4 mg via SUBLINGUAL

## 2012-11-04 MED ORDER — IPRATROPIUM BROMIDE 0.02 % IN SOLN
0.5000 mg | Freq: Once | RESPIRATORY_TRACT | Status: AC
  Administered 2012-11-04: 0.5 mg via RESPIRATORY_TRACT
  Filled 2012-11-04: qty 2.5

## 2012-11-04 MED ORDER — PANTOPRAZOLE SODIUM 40 MG PO TBEC
40.0000 mg | DELAYED_RELEASE_TABLET | Freq: Once | ORAL | Status: AC
  Administered 2012-11-04: 40 mg via ORAL
  Filled 2012-11-04: qty 1

## 2012-11-04 MED ORDER — NITROGLYCERIN 0.4 MG SL SUBL
SUBLINGUAL_TABLET | SUBLINGUAL | Status: AC
  Filled 2012-11-04: qty 25

## 2012-11-04 MED ORDER — ASPIRIN 325 MG PO TABS
325.0000 mg | ORAL_TABLET | ORAL | Status: AC
  Administered 2012-11-04: 325 mg via ORAL
  Filled 2012-11-04: qty 4

## 2012-11-04 MED ORDER — FAMOTIDINE 20 MG PO TABS
20.0000 mg | ORAL_TABLET | Freq: Once | ORAL | Status: AC
  Administered 2012-11-04: 20 mg via ORAL
  Filled 2012-11-04: qty 1
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
## 2012-11-04 NOTE — ED Notes (Signed)
Pt alert x4 from home c/o CP x1 day. Increase  heart burn, and excessive gas pain after meals. Pt place on 4l  ekg done CP protocol completed will continue to monitor.

## 2012-11-05 ENCOUNTER — Encounter (HOSPITAL_COMMUNITY): Payer: Self-pay | Admitting: Internal Medicine

## 2012-11-05 ENCOUNTER — Encounter (HOSPITAL_COMMUNITY)
Admission: EM | Disposition: A | Discharge status: Home / Self Care | Payer: Self-pay | Source: Home / Self Care | Attending: Cardiovascular Disease

## 2012-11-05 DIAGNOSIS — J45909 Unspecified asthma, uncomplicated: Secondary | ICD-10-CM | POA: Diagnosis present

## 2012-11-05 DIAGNOSIS — R0602 Shortness of breath: Secondary | ICD-10-CM | POA: Diagnosis present

## 2012-11-05 DIAGNOSIS — R7989 Other specified abnormal findings of blood chemistry: Secondary | ICD-10-CM | POA: Diagnosis present

## 2012-11-05 LAB — CBC WITH DIFFERENTIAL/PLATELET
Basophils Absolute: 0 10*3/uL (ref 0.0–0.1)
Basophils Relative: 0 % (ref 0–1)
Eosinophils Absolute: 0 10*3/uL (ref 0.0–0.7)
Hemoglobin: 14.4 g/dL (ref 13.0–17.0)
MCH: 31.4 pg (ref 26.0–34.0)
MCHC: 34.9 g/dL (ref 30.0–36.0)
Neutro Abs: 4.5 10*3/uL (ref 1.7–7.7)
Neutrophils Relative %: 85 % — ABNORMAL HIGH (ref 43–77)
Platelets: 188 10*3/uL (ref 150–400)
RDW: 12.8 % (ref 11.5–15.5)

## 2012-11-05 LAB — CK TOTAL AND CKMB (NOT AT ARMC)
CK, MB: 2 ng/mL (ref 0.3–4.0)
Relative Index: INVALID (ref 0.0–2.5)

## 2012-11-05 LAB — HEMOGLOBIN A1C
Hgb A1c MFr Bld: 5.6 % (ref <5.7)
Mean Plasma Glucose: 114 mg/dL (ref <117)

## 2012-11-05 LAB — LIPID PANEL
Cholesterol: 158 mg/dL (ref 0–200)
HDL: 31 mg/dL — ABNORMAL LOW (ref >39)
LDL Cholesterol: 115 mg/dL — ABNORMAL HIGH (ref 0–99)
Triglycerides: 59 mg/dL (ref <150)

## 2012-11-05 LAB — COMPREHENSIVE METABOLIC PANEL
Albumin: 3.8 g/dL (ref 3.5–5.2)
BUN: 9 mg/dL (ref 6–23)
CO2: 24 mEq/L (ref 19–32)
Chloride: 104 mEq/L (ref 96–112)
Creatinine, Ser: 1 mg/dL (ref 0.50–1.35)
GFR calc Af Amer: >90 mL/min (ref >90)
GFR calc non Af Amer: 89 mL/min — ABNORMAL LOW (ref >90)
Glucose, Bld: 134 mg/dL — ABNORMAL HIGH (ref 70–99)
Total Bilirubin: 0.3 mg/dL (ref 0.3–1.2)

## 2012-11-05 LAB — HEPARIN LEVEL (UNFRACTIONATED): Heparin Unfractionated: 0.67 IU/mL (ref 0.30–0.70)

## 2012-11-05 LAB — TROPONIN I: Troponin I: 0.88 ng/mL (ref <0.30)

## 2012-11-05 LAB — MRSA PCR SCREENING: MRSA by PCR: NEGATIVE

## 2012-11-05 LAB — PLATELET INHIBITION P2Y12: Platelet Function  P2Y12: 240 [PRU] (ref 194–418)

## 2012-11-05 SURGERY — LEFT HEART CATHETERIZATION WITH CORONARY ANGIOGRAM
Anesthesia: LOCAL

## 2012-11-05 MED ORDER — HEPARIN (PORCINE) IN NACL 100-0.45 UNIT/ML-% IJ SOLN
1250.0000 [IU]/h | INTRAMUSCULAR | Status: DC
  Filled 2012-11-05 (×2): qty 250

## 2012-11-05 MED ORDER — AMOXICILLIN 500 MG PO CAPS
500.0000 mg | ORAL_CAPSULE | Freq: Three times a day (TID) | ORAL | Status: DC
  Administered 2012-11-05 – 2012-11-06 (×4): 500 mg via ORAL
  Filled 2012-11-05 (×6): qty 1

## 2012-11-05 MED ORDER — ASPIRIN EC 81 MG PO TBEC: 81.0000 mg | DELAYED_RELEASE_TABLET | Freq: Every day | ORAL | Status: DC

## 2012-11-05 MED ORDER — ONDANSETRON HCL 4 MG/2ML IJ SOLN
4.0000 mg | Freq: Four times a day (QID) | INTRAMUSCULAR | Status: DC | PRN
  Administered 2012-11-05: 4 mg via INTRAVENOUS
  Filled 2012-11-05: qty 2

## 2012-11-05 MED ORDER — HEPARIN (PORCINE) IN NACL 100-0.45 UNIT/ML-% IJ SOLN
14.0000 [IU]/kg/h | INTRAMUSCULAR | Status: DC
  Filled 2012-11-05 (×2): qty 250

## 2012-11-05 MED ORDER — SODIUM CHLORIDE 0.9 % IJ SOLN: 3.0000 mL | INTRAMUSCULAR | Status: DC | PRN

## 2012-11-05 MED ORDER — HYDROCODONE-ACETAMINOPHEN 5-325 MG PO TABS
2.0000 | ORAL_TABLET | Freq: Once | ORAL | Status: AC
  Administered 2012-11-05: 1 via ORAL
  Filled 2012-11-05: qty 1

## 2012-11-05 MED ORDER — METOPROLOL TARTRATE 50 MG PO TABS
50.0000 mg | ORAL_TABLET | Freq: Two times a day (BID) | ORAL | Status: DC
  Administered 2012-11-05 – 2012-11-06 (×3): 50 mg via ORAL
  Filled 2012-11-05 (×4): qty 1

## 2012-11-05 MED ORDER — HEPARIN BOLUS VIA INFUSION
4000.0000 [IU] | Freq: Once | INTRAVENOUS | Status: AC
  Filled 2012-11-05: qty 4000

## 2012-11-05 MED ORDER — METOPROLOL TARTRATE 25 MG PO TABS
37.5000 mg | ORAL_TABLET | Freq: Two times a day (BID) | ORAL | Status: DC
  Filled 2012-11-05 (×2): qty 1

## 2012-11-05 MED ORDER — NITROGLYCERIN 0.4 MG SL SUBL: 0.4000 mg | SUBLINGUAL_TABLET | SUBLINGUAL | Status: DC | PRN

## 2012-11-05 MED ORDER — ASPIRIN 81 MG PO CHEW: 324.0000 mg | CHEWABLE_TABLET | ORAL | Status: AC

## 2012-11-05 MED ORDER — HEPARIN BOLUS VIA INFUSION
4000.0000 [IU] | Freq: Once | INTRAVENOUS | Status: AC
  Administered 2012-11-05: 4000 [IU] via INTRAVENOUS

## 2012-11-05 MED ORDER — SODIUM CHLORIDE 0.9 % IJ SOLN
3.0000 mL | Freq: Two times a day (BID) | INTRAMUSCULAR | Status: DC
  Administered 2012-11-05 (×2): 3 mL via INTRAVENOUS

## 2012-11-05 MED ORDER — ASPIRIN EC 81 MG PO TBEC
81.0000 mg | DELAYED_RELEASE_TABLET | Freq: Every day | ORAL | Status: DC
  Administered 2012-11-06: 81 mg via ORAL
  Filled 2012-11-05: qty 1

## 2012-11-05 MED ORDER — ISOSORBIDE MONONITRATE ER 30 MG PO TB24
30.0000 mg | ORAL_TABLET | Freq: Every day | ORAL | Status: DC
  Administered 2012-11-05: 30 mg via ORAL
  Filled 2012-11-05 (×2): qty 1

## 2012-11-05 MED ORDER — PANTOPRAZOLE SODIUM 40 MG PO TBEC
40.0000 mg | DELAYED_RELEASE_TABLET | Freq: Every day | ORAL | Status: DC
Start: 2012-11-05 — End: 2012-11-06
  Administered 2012-11-05 – 2012-11-06 (×2): 40 mg via ORAL
  Filled 2012-11-05 (×2): qty 1

## 2012-11-05 MED ORDER — SIMVASTATIN 40 MG PO TABS
40.0000 mg | ORAL_TABLET | Freq: Every evening | ORAL | Status: DC
  Administered 2012-11-05: 40 mg via ORAL
  Filled 2012-11-05 (×2): qty 1

## 2012-11-05 MED ORDER — HEPARIN (PORCINE) IN NACL 100-0.45 UNIT/ML-% IJ SOLN
12.0000 [IU]/kg/h | INTRAMUSCULAR | Status: DC
  Administered 2012-11-05: 12 [IU]/kg/h via INTRAVENOUS
  Filled 2012-11-05 (×2): qty 250

## 2012-11-05 MED ORDER — CALCIUM CARBONATE ANTACID 500 MG PO CHEW: 2.0000 | CHEWABLE_TABLET | Freq: Three times a day (TID) | ORAL | Status: DC | PRN

## 2012-11-05 MED ORDER — CLOPIDOGREL BISULFATE 75 MG PO TABS
75.0000 mg | ORAL_TABLET | Freq: Every day | ORAL | Status: DC
  Administered 2012-11-05 – 2012-11-06 (×2): 75 mg via ORAL
  Filled 2012-11-05 (×3): qty 1

## 2012-11-05 MED ORDER — ASPIRIN 300 MG RE SUPP: 300.0000 mg | RECTAL | Status: AC

## 2012-11-05 MED ORDER — NITROGLYCERIN IN D5W 200-5 MCG/ML-% IV SOLN
3.0000 ug/min | INTRAVENOUS | Status: DC
  Filled 2012-11-05: qty 250

## 2012-11-05 MED ORDER — DEXTROMETHORPHAN POLISTIREX 30 MG/5ML PO LQCR
15.0000 mg | Freq: Two times a day (BID) | ORAL | Status: DC
  Administered 2012-11-05 – 2012-11-06 (×2): 15 mg via ORAL
  Filled 2012-11-05 (×3): qty 5

## 2012-11-05 MED ORDER — KETOROLAC TROMETHAMINE 30 MG/ML IJ SOLN
30.0000 mg | Freq: Four times a day (QID) | INTRAMUSCULAR | Status: DC | PRN
  Administered 2012-11-05 – 2012-11-06 (×3): 30 mg via INTRAVENOUS
  Filled 2012-11-05 (×2): qty 1

## 2012-11-05 MED ORDER — ACETAMINOPHEN 325 MG PO TABS
650.0000 mg | ORAL_TABLET | ORAL | Status: DC | PRN
  Filled 2012-11-05: qty 2

## 2012-11-05 MED ORDER — HYDROCODONE-ACETAMINOPHEN 7.5-500 MG/15ML PO SOLN
15.0000 mL | Freq: Four times a day (QID) | ORAL | Status: DC | PRN
  Administered 2012-11-05 – 2012-11-06 (×3): 15 mL via ORAL
  Filled 2012-11-05 (×3): qty 15

## 2012-11-05 MED ORDER — LISINOPRIL 10 MG PO TABS
10.0000 mg | ORAL_TABLET | Freq: Every day | ORAL | Status: DC
  Filled 2012-11-05 (×2): qty 1

## 2012-11-05 MED ORDER — KETOROLAC TROMETHAMINE 30 MG/ML IJ SOLN
INTRAMUSCULAR | Status: AC
  Administered 2012-11-05: 30 mg via INTRAVENOUS
  Filled 2012-11-05: qty 1

## 2012-11-05 MED ORDER — SODIUM CHLORIDE 0.9 % IV SOLN: 250.0000 mL | INTRAVENOUS | Status: DC | PRN

## 2012-11-05 NOTE — Progress Notes (Addendum)
ANTICOAGULATION CONSULT NOTE - Follow Up Consult  Pharmacy Consult for Heparin Indication: chest pain/ACS  Allergies  Allergen Reactions  . Morphine And Related Other (See Comments)    Patient does not want to take this medication and it causes severe constipation.  . Other     Cannot take decongestants due to Cardiac history    Patient Measurements: Height: 5\' 10"  (177.8 cm) Weight: 274 lb 0.5 oz (124.3 kg) IBW/kg (Calculated) : 73  Heparin Dosing Weight: 100kg  Vital Signs: Temp: 98.4 F (36.9 C) (12/31 1205) Temp src: Oral (12/31 1205) BP: 120/83 mmHg (12/31 1203) Pulse Rate: 61  (12/31 0724)  Labs:  Basename 11/05/12 1220 11/05/12 1114 11/05/12 0653 11/04/12 2145  HGB -- -- 14.4 15.3  HCT -- -- 41.3 43.6  PLT -- -- 188 218  APTT -- -- 173* --  LABPROT -- -- 15.3* --  INR -- -- 1.23 --  HEPARINUNFRC 0.67 -- -- --  CREATININE -- -- 1.00 1.23  CKTOTAL -- 86 -- --  CKMB -- 2.0 -- --  TROPONINI -- 0.88* 1.05* 0.67*    Estimated Creatinine Clearance: 123.4 ml/min (by C-G formula based on Cr of 1).   Medications:  Heparin @ 1500 units/hr  Assessment: 46yom started on heparin this morning for CP. Initial heparin level is therapeutic. CBC stable. No bleeding reported by the patient. No plans for repeat cath at this point.  Goal of Therapy:  Heparin level 0.3-0.7 units/ml Monitor platelets by anticoagulation protocol: Yes   Plan:  1) Continue heparin at 1500 units/hr 2) Check 6h heparin level to confirm  Fredrik Rigger 11/05/2012,1:37 PM
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
## 2012-11-05 NOTE — Progress Notes (Signed)
   Spoke to Mr. Huey Bienenstock PA, attending will be changed to Dr. Nicki Guadalajara.  [facility] hospitalist has very little to add to this gentleman care, please call us if needed.  Clint Lipps Pager: [phone] 11/05/2012, 10:35 AM

## 2012-11-05 NOTE — Progress Notes (Signed)
Nutrition Brief Note  Patient identified on the Malnutrition Screening Tool (MST) Report for unsure of weight loss.  Per discussion with patient he is hungry, but has not had anything to eat in 2 days.  Has not lost a significant amount of weight per review of weights in EMR:  Wt Readings from Last 10 Encounters:  11/05/12 274 lb 0.5 oz (124.3 kg)  11/05/12 274 lb 0.5 oz (124.3 kg)  08/12/12 282 lb (127.914 kg)  06/28/12 279 lb 15.8 oz (127 kg)  06/28/12 279 lb 15.8 oz (127 kg)  06/22/12 280 lb (127.007 kg)  05/13/12 282 lb (127.914 kg)  05/02/12 282 lb (127.914 kg)  04/28/12 280 lb (127.007 kg)  01/01/12 288 lb 12.8 oz (131 kg)    Body mass index is 39.32 kg/(m^2). Pt meets criteria for class 2 obesity based on current BMI.   Current diet order is Heart Healthy, patient is hungry and expect PO intake will be good. Labs and medications reviewed.   No nutrition interventions warranted at this time. If nutrition issues arise, please consult RD.   Joaquin Courts, RD, LDN, CNSC Pager# [phone] After Hours Pager# [phone]

## 2012-11-05 NOTE — Progress Notes (Signed)
Pt non-compliant this am, pt wants to get OOB, MD/N, pt to cont BR as activity, pt educated by MD this am of pos cardiac enzymes with pos complications, pt verbalized understanding of MD educated and RN reinforcement of educated however stated will do what he wants

## 2012-11-05 NOTE — Progress Notes (Signed)
ANTICOAGULATION CONSULT NOTE - Follow Up Consult  Pharmacy Consult for Heparin Indication: chest pain/ACS  Allergies  Allergen Reactions  . Morphine And Related Other (See Comments)    Patient does not want to take this medication and it causes severe constipation.  . Other     Cannot take decongestants due to Cardiac history    Patient Measurements: Height: 5\' 10"  (177.8 cm) Weight: 274 lb 0.5 oz (124.3 kg) IBW/kg (Calculated) : 73  Heparin Dosing Weight: 100kg  Vital Signs: Temp: 98.4 F (36.9 C) (12/31 1609) Temp src: Oral (12/31 1609) BP: 120/83 mmHg (12/31 1203) Pulse Rate: 61  (12/31 0724)  Labs:  Basename 11/05/12 1753 11/05/12 1220 11/05/12 1114 11/05/12 0653 11/04/12 2145  HGB -- -- -- 14.4 15.3  HCT -- -- -- 41.3 43.6  PLT -- -- -- 188 218  APTT -- -- -- 173* --  LABPROT -- -- -- 15.3* --  INR -- -- -- 1.23 --  HEPARINUNFRC 0.92* 0.67 -- -- --  CREATININE -- -- -- 1.00 1.23  CKTOTAL -- -- 86 -- --  CKMB -- -- 2.0 -- --  TROPONINI -- -- 0.88* 1.05* 0.67*    Estimated Creatinine Clearance: 123.4 ml/min (by C-G formula based on Cr of 1).   Medications:     . heparin 12.068 Units/kg/hr (11/05/12 1700)  . nitroGLYCERIN    . [DISCONTINUED] heparin       Assessment: 46yom started on heparin this morning for CP. Repeat heparin level is now supratherapeutic.  CBC stable. No bleeding reported by the patient. No plans for repeat cath at this point.  Goal of Therapy:  Heparin level 0.3-0.7 units/ml Monitor platelets by anticoagulation protocol: Yes   Plan:  Decrease Heparin infusion to 1250 units/hr Check Heparin level in 6 hours  Estella Husk, Pharm.D., BCPS Clinical Pharmacist  Phone [phone] Pager [phone] 11/05/2012, 6:35 PM

## 2012-11-05 NOTE — ED Notes (Signed)
Report given to [employer]. Report given [facility] Gainer RN Morrie Sheldon. Pt awaiting tx via Ptar. Will monitor.
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
## 2012-11-05 NOTE — Progress Notes (Signed)
Utilization Review Completed.   Oneil Behney, RN, BSN Nurse Case Manager  [phone]  

## 2012-11-05 NOTE — Progress Notes (Signed)
CRITICAL VALUE ALERT  Critical value received:  Trop I  Date of notification: [id number] Time of notification: 0800  Critical value read back:yes  Nurse who received alert:  Renold Genta.  MD notified (1st page):  0805  Time of first page:  0808  MD notified (2nd page):0809  Time of second [id number]  Responding MD: [employer], Oletha Cruel  Time MD responded: [phone], [phone]
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
## 2012-11-06 LAB — CBC
MCV: 91.6 fL (ref 78.0–100.0)
Platelets: 183 10*3/uL (ref 150–400)
RBC: 3.94 MIL/uL — ABNORMAL LOW (ref 4.22–5.81)
RDW: 12.9 % (ref 11.5–15.5)
WBC: 7.9 10*3/uL (ref 4.0–10.5)

## 2012-11-06 LAB — HEPARIN LEVEL (UNFRACTIONATED): Heparin Unfractionated: 0.6 IU/mL (ref 0.30–0.70)

## 2012-11-06 MED ORDER — METOPROLOL TARTRATE 50 MG PO TABS: 50.0000 mg | ORAL_TABLET | Freq: Two times a day (BID) | ORAL | Status: DC

## 2012-11-06 MED ORDER — TICAGRELOR 90 MG PO TABS: 90.0000 mg | ORAL_TABLET | Freq: Two times a day (BID) | ORAL | Status: DC

## 2012-11-06 MED ORDER — TICAGRELOR 90 MG PO TABS
90.0000 mg | ORAL_TABLET | Freq: Two times a day (BID) | ORAL | Status: DC
  Filled 2012-11-06 (×2): qty 1

## 2012-11-06 MED ORDER — ISOSORBIDE MONONITRATE ER 30 MG PO TB24: 30.0000 mg | ORAL_TABLET | Freq: Every day | ORAL | Status: DC

## 2012-11-06 NOTE — Progress Notes (Signed)
[facility] Progress Note  Subjective:  No recurrent chest pain; c/o his chronic back discomfort  Objective:   Vital Signs in the last 24 hours: Temp:  [97.4 F (36.3 C)-98.4 F (36.9 C)] 98.1 F (36.7 C) (01/01 0750) Pulse Rate:  [58-66] 58  (01/01 0300) Resp:  [11-15] 11  (01/01 0300) BP: (89-120)/(46-84) 108/58 mmHg (01/01 0300) SpO2:  [94 %-98 %] 96 % (01/01 0300)  Intake/Output from previous day: 12/31 0701 - 01/01 0700 In: 993 [P.O.:840; I.V.:153] Out: 200 [Urine:200]  Scheduled:   . amoxicillin  500 mg Oral TID  . aspirin EC  81 mg Oral Daily  . clopidogrel  75 mg Oral Q breakfast  . dextromethorphan  15 mg Oral BID  . isosorbide mononitrate  30 mg Oral Daily  . lisinopril  10 mg Oral QHS  . metoprolol tartrate  50 mg Oral BID  . pantoprazole  40 mg Oral Daily  . simvastatin  40 mg Oral QPM  . sodium chloride  3 mL Intravenous Q12H    Physical Exam:   General appearance: no distress Neck: no JVD Lungs: clear to auscultation bilaterally Heart: S1, S2 normal and 1/6 sem Abdomen:nontender BS+ Extremities: trace edema   Rate: 50  Rhythm: paced  Lab Results:    Basename 11/05/12 0653 11/04/12 2145  NA 137 137  K 4.4 4.0  CL 104 101  CO2 24 24  GLUCOSE 134* 105*  BUN 9 9  CREATININE 1.00 1.23    Basename 11/05/12 1752 11/05/12 1114  TROPONINI 1.16* 0.88*   Cardiac Panel (last 3 results)  Basename 11/05/12 1752 11/05/12 1114 11/05/12 0653  CKTOTAL -- 86 --  CKMB -- 2.0 --  TROPONINI 1.16* 0.88* 1.05*  RELINDX -- RELATIVE INDEX IS INVALID --   Hepatic Function Panel  Basename 11/05/12 0653 11/04/12 2145  PROT 7.0 --  ALBUMIN 3.8 --  AST 20 --  ALT 24 --  ALKPHOS 36* --  BILITOT 0.3 --  BILIDIR -- <0.1  IBILI -- NOT CALCULATED    Basename 11/05/12 0653  INR 1.23    Lipid Panel     Component Value Date/Time   CHOL 158 11/05/2012 0653   TRIG 59 11/05/2012 0653   HDL 31* 11/05/2012 0653   CHOLHDL  5.1 11/05/2012 0653   VLDL 12 11/05/2012 0653   LDLCALC 115* 11/05/2012 0653     Imaging:  Dg Chest Port 1 View  11/04/2012  *RADIOLOGY REPORT*  Clinical Data: Chest pain.  PORTABLE CHEST - 1 VIEW  Comparison: Chest radiograph from 06/26/2012  Findings: The lungs are well-aerated.  Mild chronically increased interstitial markings are noted.  There is no evidence of focal opacification, pleural effusion or pneumothorax.  The cardiomediastinal silhouette is normal in size.  A pacemaker/AICD is noted overlying the left chest wall, with leads ending overlying the right atrium and right ventricle.  No acute osseous abnormalities are seen.  IMPRESSION: No acute cardiopulmonary process seen; mild chronic lung changes noted.   Original Report Authenticated By: Tonia Ghent, M.D.       Assessment/Plan:   Active Problems:  CAD, Hx of prior stents LAD and CFX 2005 in [place]  Chest pain, epigastric pain, due to NSTMI  Shortness of breath  Elevated troponin I level  Reactive airway disease   P2Y12 assessment suggest poor responder to clopidigril, will change to brilinta at 90 mg bid.  No history of DM. CPK MB normal with mionimal troponin elevation.  Medical  therapy.   Lennette Bihari, MD, [facility] 11/06/2012, 9:25 AM

## 2012-11-06 NOTE — Progress Notes (Signed)
ANTICOAGULATION CONSULT NOTE - Follow Up Consult  Pharmacy Consult for Heparin Indication: chest pain/ACS  Allergies  Allergen Reactions  . Morphine And Related Other (See Comments)    Patient does not want to take this medication and it causes severe constipation.  . Other     Cannot take decongestants due to Cardiac history    Patient Measurements: Height: 5\' 10"  (177.8 cm) Weight: 274 lb 0.5 oz (124.3 kg) IBW/kg (Calculated) : 73  Heparin Dosing Weight: 100kg  Vital Signs: Temp: 97.8 F (36.6 C) (12/31 2300) Temp src: Oral (12/31 2300) BP: 114/65 mmHg (12/31 2305) Pulse Rate: 60  (12/31 2300)  Labs:  Basename 11/06/12 0055 11/05/12 1753 11/05/12 1752 11/05/12 1220 11/05/12 1114 11/05/12 0653 11/04/12 2145  HGB 12.0* -- -- -- -- 14.4 --  HCT 36.1* -- -- -- -- 41.3 43.6  PLT 183 -- -- -- -- 188 218  APTT -- -- -- -- -- 173* --  LABPROT -- -- -- -- -- 15.3* --  INR -- -- -- -- -- 1.23 --  HEPARINUNFRC 0.60 0.92* -- 0.67 -- -- --  CREATININE -- -- -- -- -- 1.00 1.23  CKTOTAL -- -- -- -- 86 -- --  CKMB -- -- -- -- 2.0 -- --  TROPONINI -- -- 1.16* -- 0.88* 1.05* --    Estimated Creatinine Clearance: 123.4 ml/min (by C-G formula based on Cr of 1).  Assessment: 47 yo male with chest pain for Heparin   Goal of Therapy:  Heparin level 0.3-0.7 units/ml Monitor platelets by anticoagulation protocol: Yes   Plan:  Continue Heparin at current rate  Geannie Risen, PharmD, BCPS  11/06/2012, 2:04 AM

## 2012-11-06 NOTE — Progress Notes (Signed)
Discharge instructions given to pt.  Pt verbalized understanding with all questions answered.  Pt discharged to home with family.  Roselie Awkward, RN

## 2012-11-06 NOTE — Progress Notes (Signed)
ANTICOAGULATION CONSULT NOTE - Follow Up Consult  Pharmacy Consult for Heparin Indication: chest pain/ACS  Allergies  Allergen Reactions  . Morphine And Related Other (See Comments)    Patient does not want to take this medication and it causes severe constipation.  . Other     Cannot take decongestants due to Cardiac history    Patient Measurements: Height: 5\' 10"  (177.8 cm) Weight: 274 lb 0.5 oz (124.3 kg) IBW/kg (Calculated) : 73  Heparin Dosing Weight: 100kg  Vital Signs: Temp: 98.1 F (36.7 C) (01/01 0750) Temp src: Oral (01/01 0750) BP: 108/58 mmHg (01/01 0300) Pulse Rate: 58  (01/01 0300)  Labs:  Basename 11/06/12 0542 11/06/12 0055 11/05/12 1753 11/05/12 1752 11/05/12 1114 11/05/12 0653 11/04/12 2145  HGB -- 12.0* -- -- -- 14.4 --  HCT -- 36.1* -- -- -- 41.3 43.6  PLT -- 183 -- -- -- 188 218  APTT -- -- -- -- -- 173* --  LABPROT -- -- -- -- -- 15.3* --  INR -- -- -- -- -- 1.23 --  HEPARINUNFRC 0.55 0.60 0.92* -- -- -- --  CREATININE -- -- -- -- -- 1.00 1.23  CKTOTAL -- -- -- -- 86 -- --  CKMB -- -- -- -- 2.0 -- --  TROPONINI -- -- -- 1.16* 0.88* 1.05* --    Estimated Creatinine Clearance: 123.4 ml/min (by C-G formula based on Cr of 1).   Medications:  Heparin @ 1250 units/hr  Assessment: 46yom continues on heparin with a therapeutic heparin level. Noted drop in Hgb, platelets stable. No bleeding reported by the patient. No plans for cath - continue medical management.  Goal of Therapy:  Heparin level 0.3-0.7 units/ml Monitor platelets by anticoagulation protocol: Yes   Plan:  1) Continue heparin at 1250 units/hr 2) Follow up heparin level, CBC in AM  Fredrik Rigger 11/06/2012,9:59 AM

## 2012-11-10 ENCOUNTER — Encounter (HOSPITAL_COMMUNITY): Payer: Self-pay | Admitting: *Deleted

## 2012-11-10 ENCOUNTER — Emergency Department (HOSPITAL_COMMUNITY): Payer: Medicaid Other

## 2012-11-10 ENCOUNTER — Observation Stay (HOSPITAL_COMMUNITY)
Admission: EM | Admit: 2012-11-10 | Discharge: 2012-11-12 | Disposition: A | Discharge status: Home / Self Care | Payer: Medicaid Other | Attending: Internal Medicine | Admitting: Internal Medicine

## 2012-11-10 DIAGNOSIS — Z9861 Coronary angioplasty status: Secondary | ICD-10-CM | POA: Insufficient documentation

## 2012-11-10 DIAGNOSIS — E669 Obesity, unspecified: Secondary | ICD-10-CM

## 2012-11-10 DIAGNOSIS — Z95810 Presence of automatic (implantable) cardiac defibrillator: Secondary | ICD-10-CM

## 2012-11-10 DIAGNOSIS — R7989 Other specified abnormal findings of blood chemistry: Secondary | ICD-10-CM

## 2012-11-10 DIAGNOSIS — M549 Dorsalgia, unspecified: Secondary | ICD-10-CM | POA: Diagnosis present

## 2012-11-10 DIAGNOSIS — J45909 Unspecified asthma, uncomplicated: Secondary | ICD-10-CM

## 2012-11-10 DIAGNOSIS — N509 Disorder of male genital organs, unspecified: Secondary | ICD-10-CM | POA: Insufficient documentation

## 2012-11-10 DIAGNOSIS — I471 Supraventricular tachycardia, unspecified: Secondary | ICD-10-CM

## 2012-11-10 DIAGNOSIS — K859 Acute pancreatitis without necrosis or infection, unspecified: Secondary | ICD-10-CM

## 2012-11-10 DIAGNOSIS — R079 Chest pain, unspecified: Secondary | ICD-10-CM

## 2012-11-10 DIAGNOSIS — E785 Hyperlipidemia, unspecified: Secondary | ICD-10-CM | POA: Insufficient documentation

## 2012-11-10 DIAGNOSIS — I213 ST elevation (STEMI) myocardial infarction of unspecified site: Secondary | ICD-10-CM

## 2012-11-10 DIAGNOSIS — R1031 Right lower quadrant pain: Principal | ICD-10-CM | POA: Insufficient documentation

## 2012-11-10 DIAGNOSIS — I10 Essential (primary) hypertension: Secondary | ICD-10-CM | POA: Insufficient documentation

## 2012-11-10 DIAGNOSIS — R0602 Shortness of breath: Secondary | ICD-10-CM

## 2012-11-10 DIAGNOSIS — N433 Hydrocele, unspecified: Secondary | ICD-10-CM

## 2012-11-10 DIAGNOSIS — R52 Pain, unspecified: Secondary | ICD-10-CM

## 2012-11-10 DIAGNOSIS — I255 Ischemic cardiomyopathy: Secondary | ICD-10-CM

## 2012-11-10 DIAGNOSIS — G8929 Other chronic pain: Secondary | ICD-10-CM

## 2012-11-10 DIAGNOSIS — I2510 Atherosclerotic heart disease of native coronary artery without angina pectoris: Secondary | ICD-10-CM

## 2012-11-10 DIAGNOSIS — I2589 Other forms of chronic ischemic heart disease: Secondary | ICD-10-CM | POA: Insufficient documentation

## 2012-11-10 DIAGNOSIS — N5082 Scrotal pain: Secondary | ICD-10-CM

## 2012-11-10 DIAGNOSIS — R748 Abnormal levels of other serum enzymes: Secondary | ICD-10-CM

## 2012-11-10 DIAGNOSIS — R109 Unspecified abdominal pain: Secondary | ICD-10-CM

## 2012-11-10 LAB — URINALYSIS, ROUTINE W REFLEX MICROSCOPIC
Bilirubin Urine: NEGATIVE
Glucose, UA: NEGATIVE mg/dL
Hgb urine dipstick: NEGATIVE
Protein, ur: NEGATIVE mg/dL
Urobilinogen, UA: 1 mg/dL (ref 0.0–1.0)

## 2012-11-10 LAB — CBC WITH DIFFERENTIAL/PLATELET
Eosinophils Absolute: 0.1 10*3/uL (ref 0.0–0.7)
Eosinophils Relative: 1 % (ref 0–5)
HCT: 45.6 % (ref 39.0–52.0)
Lymphs Abs: 1.5 10*3/uL (ref 0.7–4.0)
MCH: 31.9 pg (ref 26.0–34.0)
MCV: 91.6 fL (ref 78.0–100.0)
Monocytes Absolute: 0.5 10*3/uL (ref 0.1–1.0)
Platelets: 232 10*3/uL (ref 150–400)
RDW: 13.1 % (ref 11.5–15.5)

## 2012-11-10 LAB — COMPREHENSIVE METABOLIC PANEL
ALT: 34 U/L (ref 0–53)
Alkaline Phosphatase: 38 U/L — ABNORMAL LOW (ref 39–117)
CO2: 27 mEq/L (ref 19–32)
Chloride: 102 mEq/L (ref 96–112)
GFR calc Af Amer: >90 mL/min (ref >90)
GFR calc non Af Amer: >90 mL/min (ref >90)
Glucose, Bld: 111 mg/dL — ABNORMAL HIGH (ref 70–99)
Potassium: 4 mEq/L (ref 3.5–5.1)
Sodium: 140 mEq/L (ref 135–145)
Total Bilirubin: 0.3 mg/dL (ref 0.3–1.2)

## 2012-11-10 MED ORDER — ONDANSETRON HCL 4 MG/2ML IJ SOLN: 4.0000 mg | Freq: Four times a day (QID) | INTRAMUSCULAR | Status: DC | PRN

## 2012-11-10 MED ORDER — ONDANSETRON HCL 4 MG/2ML IJ SOLN
4.0000 mg | Freq: Once | INTRAMUSCULAR | Status: AC
  Administered 2012-11-10: 4 mg via INTRAVENOUS
  Filled 2012-11-10: qty 2

## 2012-11-10 MED ORDER — ASPIRIN EC 81 MG PO TBEC
81.0000 mg | DELAYED_RELEASE_TABLET | Freq: Every day | ORAL | Status: DC
  Administered 2012-11-11: 81 mg via ORAL
  Filled 2012-11-10 (×2): qty 1

## 2012-11-10 MED ORDER — SODIUM CHLORIDE 0.9 % IJ SOLN
3.0000 mL | Freq: Two times a day (BID) | INTRAMUSCULAR | Status: DC
  Administered 2012-11-10 – 2012-11-11 (×3): 3 mL via INTRAVENOUS

## 2012-11-10 MED ORDER — ISOSORBIDE MONONITRATE ER 30 MG PO TB24
30.0000 mg | ORAL_TABLET | Freq: Every day | ORAL | Status: DC
  Administered 2012-11-11: 30 mg via ORAL
  Filled 2012-11-10 (×2): qty 1

## 2012-11-10 MED ORDER — HYDROMORPHONE HCL PF 2 MG/ML IJ SOLN
2.0000 mg | Freq: Once | INTRAMUSCULAR | Status: AC
  Administered 2012-11-10: 2 mg via INTRAVENOUS
  Filled 2012-11-10: qty 1

## 2012-11-10 MED ORDER — METOPROLOL TARTRATE 50 MG PO TABS
50.0000 mg | ORAL_TABLET | Freq: Two times a day (BID) | ORAL | Status: DC
  Administered 2012-11-10 – 2012-11-11 (×3): 50 mg via ORAL
  Filled 2012-11-10 (×5): qty 1

## 2012-11-10 MED ORDER — HYDROMORPHONE HCL PF 1 MG/ML IJ SOLN
1.0000 mg | INTRAMUSCULAR | Status: DC | PRN
  Administered 2012-11-10 – 2012-11-11 (×5): 1 mg via INTRAVENOUS
  Filled 2012-11-10 (×5): qty 1

## 2012-11-10 MED ORDER — DIPHENHYDRAMINE HCL 50 MG/ML IJ SOLN
25.0000 mg | Freq: Once | INTRAMUSCULAR | Status: AC
  Administered 2012-11-10: 25 mg via INTRAVENOUS
  Filled 2012-11-10: qty 1

## 2012-11-10 MED ORDER — LISINOPRIL 10 MG PO TABS
10.0000 mg | ORAL_TABLET | Freq: Every day | ORAL | Status: DC
  Administered 2012-11-10 – 2012-11-11 (×2): 10 mg via ORAL
  Filled 2012-11-10 (×3): qty 1

## 2012-11-10 MED ORDER — AMOXICILLIN 500 MG PO CAPS
500.0000 mg | ORAL_CAPSULE | Freq: Three times a day (TID) | ORAL | Status: DC
  Administered 2012-11-10 – 2012-11-11 (×4): 500 mg via ORAL
  Filled 2012-11-10 (×7): qty 1

## 2012-11-10 MED ORDER — TICAGRELOR 90 MG PO TABS
90.0000 mg | ORAL_TABLET | Freq: Two times a day (BID) | ORAL | Status: DC
  Administered 2012-11-10 – 2012-11-11 (×3): 90 mg via ORAL
  Filled 2012-11-10 (×5): qty 1

## 2012-11-10 MED ORDER — ACETAMINOPHEN 325 MG PO TABS
650.0000 mg | ORAL_TABLET | Freq: Four times a day (QID) | ORAL | Status: DC | PRN
  Filled 2012-11-10: qty 2

## 2012-11-10 MED ORDER — HYDROMORPHONE HCL PF 1 MG/ML IJ SOLN
2.0000 mg | Freq: Once | INTRAMUSCULAR | Status: AC
  Administered 2012-11-10: 2 mg via INTRAVENOUS
  Filled 2012-11-10: qty 2

## 2012-11-10 MED ORDER — CALCIUM CARBONATE ANTACID 500 MG PO CHEW: 2.0000 | CHEWABLE_TABLET | Freq: Three times a day (TID) | ORAL | Status: DC | PRN

## 2012-11-10 MED ORDER — IOHEXOL 300 MG/ML  SOLN
100.0000 mL | Freq: Once | INTRAMUSCULAR | Status: AC | PRN
  Administered 2012-11-10: 100 mL via INTRAVENOUS

## 2012-11-10 MED ORDER — ENOXAPARIN SODIUM 40 MG/0.4ML ~~LOC~~ SOLN
40.0000 mg | SUBCUTANEOUS | Status: DC
  Administered 2012-11-11: 40 mg via SUBCUTANEOUS
  Filled 2012-11-10 (×2): qty 0.4

## 2012-11-10 MED ORDER — PANTOPRAZOLE SODIUM 40 MG PO TBEC
40.0000 mg | DELAYED_RELEASE_TABLET | Freq: Every day | ORAL | Status: DC
  Administered 2012-11-11: 40 mg via ORAL
  Filled 2012-11-10: qty 1

## 2012-11-10 MED ORDER — NITROGLYCERIN 0.4 MG SL SUBL: 0.4000 mg | SUBLINGUAL_TABLET | SUBLINGUAL | Status: DC | PRN

## 2012-11-10 MED ORDER — SIMVASTATIN 40 MG PO TABS
40.0000 mg | ORAL_TABLET | Freq: Every evening | ORAL | Status: DC
  Administered 2012-11-11: 40 mg via ORAL
  Filled 2012-11-10 (×2): qty 1

## 2012-11-10 MED ORDER — ACETAMINOPHEN 650 MG RE SUPP: 650.0000 mg | Freq: Four times a day (QID) | RECTAL | Status: DC | PRN

## 2012-11-10 MED ORDER — DOCUSATE SODIUM 100 MG PO CAPS
100.0000 mg | ORAL_CAPSULE | Freq: Two times a day (BID) | ORAL | Status: DC
  Administered 2012-11-10 – 2012-11-11 (×3): 100 mg via ORAL
  Filled 2012-11-10 (×4): qty 1

## 2012-11-10 MED ORDER — HYDROMORPHONE HCL PF 1 MG/ML IJ SOLN
2.0000 mg | Freq: Once | INTRAMUSCULAR | Status: AC
  Administered 2012-11-10: 1 mg via INTRAVENOUS
  Filled 2012-11-10: qty 1

## 2012-11-10 MED ORDER — SODIUM CHLORIDE 0.9 % IV SOLN
1000.0000 mL | INTRAVENOUS | Status: DC
  Administered 2012-11-10 – 2012-11-11 (×3): 1000 mL via INTRAVENOUS

## 2012-11-10 MED ORDER — ONDANSETRON HCL 4 MG PO TABS: 4.0000 mg | ORAL_TABLET | Freq: Four times a day (QID) | ORAL | Status: DC | PRN

## 2012-11-10 MED ORDER — HYDROMORPHONE HCL PF 1 MG/ML IJ SOLN
INTRAMUSCULAR | Status: AC
  Administered 2012-11-10: 1 mg
  Filled 2012-11-10: qty 1

## 2012-11-10 NOTE — ED Notes (Signed)
Patient transported to CT 
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
## 2012-11-10 NOTE — ED Notes (Addendum)
Pt has hernia pain and had mesh repair. Pt reports nausea but denies vomiting, diarr. Pt appears to be in severe pain. Recently released from hospital with blood clot issues.

## 2012-11-10 NOTE — ED Notes (Signed)
[place] to [place].
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
## 2012-11-10 NOTE — ED Provider Notes (Signed)
Patient signed out to me by Dr. Ignacia Palma. Ultrasound has come back showing a hydrocele which would account for the scrotal mass, but no pathology which should account for his pain. He continues to be in severe pain requiring large doses of hydromorphone and will need to be admitted for pain control and he will need urology consultation. Case is discussed with Dr. Arbutus Leas of [facility] who agrees to come and evaluate the patient.   [place] Art/ven Flow Abd Pelv Doppler  11/10/2012  *RADIOLOGY REPORT*  Clinical Data:  Right scrotal swelling and pain.  Right inguinal pain.  Prior hernia repair.  SCROTAL ULTRASOUND DOPPLER ULTRASOUND OF THE TESTICLES  Technique: Complete ultrasound examination of the testicles, epididymis, and other scrotal structures was performed.  Color and spectral Doppler ultrasound were also utilized to evaluate blood flow to the testicles.  Comparison:  11/10/2012  Findings:  Right testis:  Measures 5.1 x 2.5 x 3.5 cm, with normal echogenicity and echotexture.  Left testis:  Measures 5.1 x 2.5 x 3.0 cm, with normal echogenicity and echotexture.  Right epididymis:  Normal in size and appearance.  Left epididymis:  Normal in size and appearance.  Hydrocele:  Large right hydrocele.  Trace left hydrocele.  Varicocele:  Absent  Pulsed Doppler interrogation of both testes demonstrates low resistance flow bilaterally. No findings of torsion.  Vascularity of the testicles appears symmetric.  No discrete sonographic abnormality is observed in the right inguinal region in the area of the patient's pain.  IMPRESSION:  1.  Large right and trace left hydroceles.  Normal sonographic appearance of the testicles and epididymides.   Original Report Authenticated By: Gaylyn Rong, M.D.       Dione Booze, MD 11/10/12 2051

## 2012-11-10 NOTE — ED Notes (Signed)
PT states pain 10/10.  Dr Ignacia Palma notified and is awaiting callback from surgery.

## 2012-11-11 DIAGNOSIS — G8929 Other chronic pain: Secondary | ICD-10-CM

## 2012-11-11 DIAGNOSIS — I2510 Atherosclerotic heart disease of native coronary artery without angina pectoris: Secondary | ICD-10-CM

## 2012-11-11 DIAGNOSIS — M549 Dorsalgia, unspecified: Secondary | ICD-10-CM

## 2012-11-11 DIAGNOSIS — R748 Abnormal levels of other serum enzymes: Secondary | ICD-10-CM

## 2012-11-11 DIAGNOSIS — R079 Chest pain, unspecified: Secondary | ICD-10-CM

## 2012-11-11 LAB — BASIC METABOLIC PANEL
CO2: 27 mEq/L (ref 19–32)
Calcium: 8.9 mg/dL (ref 8.4–10.5)
Creatinine, Ser: 1.03 mg/dL (ref 0.50–1.35)

## 2012-11-11 LAB — MRSA PCR SCREENING: MRSA by PCR: NEGATIVE

## 2012-11-11 LAB — CBC
MCV: 92.1 fL (ref 78.0–100.0)
Platelets: 223 10*3/uL (ref 150–400)
RDW: 13.3 % (ref 11.5–15.5)
WBC: 9.8 10*3/uL (ref 4.0–10.5)

## 2012-11-11 LAB — GAMMA GT: GGT: 65 U/L — ABNORMAL HIGH (ref 7–51)

## 2012-11-11 LAB — RAPID URINE DRUG SCREEN, HOSP PERFORMED: Benzodiazepines: NOT DETECTED

## 2012-11-11 MED ORDER — SODIUM CHLORIDE 0.9 % IV SOLN: 1000.0000 mL | INTRAVENOUS | Status: DC

## 2012-11-11 MED ORDER — KETOROLAC TROMETHAMINE 15 MG/ML IJ SOLN
15.0000 mg | Freq: Four times a day (QID) | INTRAMUSCULAR | Status: DC | PRN
  Filled 2012-11-11: qty 1

## 2012-11-11 MED ORDER — DIAZEPAM 2 MG PO TABS
2.0000 mg | ORAL_TABLET | Freq: Two times a day (BID) | ORAL | Status: AC
  Administered 2012-11-11 (×2): 2 mg via ORAL
  Filled 2012-11-11 (×2): qty 1

## 2012-11-11 MED ORDER — HYDROMORPHONE HCL PF 1 MG/ML IJ SOLN
1.0000 mg | INTRAMUSCULAR | Status: DC | PRN
  Administered 2012-11-11 – 2012-11-12 (×3): 1 mg via INTRAVENOUS
  Filled 2012-11-11 (×3): qty 1

## 2012-11-11 MED ORDER — KETOROLAC TROMETHAMINE 30 MG/ML IJ SOLN
30.0000 mg | Freq: Once | INTRAMUSCULAR | Status: AC
  Administered 2012-11-11: 30 mg via INTRAVENOUS
  Filled 2012-11-11: qty 1

## 2012-11-11 NOTE — Progress Notes (Signed)
Patient ID: Gerald Simpson  male  GNF:[id number]    DOB: 09-Sep-1966    DOA: 11/10/2012  PCP: Lennette Bihari, MD  Assessment/Plan:  Uncontrolled pain, right lower quadrant, inguinal: unclear e/o.  CT abd and pelvis completely negative for acute intra-abdominal pathology, no ureteral stone or appendicitis. Patient also gives a history of fall over a banister 2 days ago at home but does appear to have a hematoma or bruising. He states that he did lift weight after that fall was not significant enough to cause this much pain. Patient was evaluated by [facility] in ED and did not feel it as any surgical issue. Also states, similar episode "couple of years ago" and no e/o was found. - Scrotal US showed large right hydrocele otherwise no epididymitis or testicle torsion - I discussed with Dr Mena Goes (Urology on call) who did not recommend any acute urological intervention.    - Possibly could be MSK pain, will start anti inflammotory Toradol with muscle relaxant (Valium)    ischemic cardiomyopathy  -Continue Brilinta, aspirin, imdur, ACEI  -Feb 2013 ejection fraction 40%   Elevated lipase  -little clinical significance at this point, CT abdomen did not show any acute pancreatitis   Hypertension  - Continue metoprolol tartrate   Hyperlipidemia  -Continue simvastatin   Strep pharyngitis  -Per patient history, diagnosed prior to last hospitalization, Finished 10 days Amoxil--due to finish on 11/14/12  -Currently denies dysphagia, odynophagia, sore throat  DVT Prophylaxis:  Code Status: Full  Disposition: Hopefully tomorrow if pain is improved    Subjective: C/o pain in right inguinal area, no inguinal hernia.   Objective: Weight change:  No intake or output data in the 24 hours ending 11/11/12 1054 Blood pressure 146/94, pulse 60, temperature 97.7 F (36.5 C), temperature source Oral, resp. rate 18, weight 123.197 kg (271 lb 9.6 oz), SpO2 100.00%.  Physical Exam: General: Alert and awake,  oriented x3, not in any acute distress. HEENT: anicteric sclera, pupils reactive to light and accommodation, EOMI CVS: S1-S2 clear, no murmur rubs or gallops Chest: clear to auscultation bilaterally, no wheezing, rales or rhonchi Abdomen: soft nontender, nondistended, normal bowel sounds, no organomegaly Extremities: no cyanosis, clubbing or edema noted bilaterally Neuro: Cranial nerves II-XII intact, no focal neurological deficits  Lab Results: Basic Metabolic Panel:  Lab 11/11/12 8469 11/10/12 1221 11/05/12 0653  NA 137 140 --  K 3.3* 4.0 --  CL 100 102 --  CO2 27 27 --  GLUCOSE 87 111* --  BUN 6 7 --  CREATININE 1.03 0.98 --  CALCIUM 8.9 9.4 --  MG -- -- 2.2  PHOS -- -- --   Liver Function Tests:  Lab 11/10/12 1221 11/05/12 0653  AST 30 20  ALT 34 24  ALKPHOS 38* 36*  BILITOT 0.3 0.3  PROT 7.3 7.0  ALBUMIN 3.8 3.8    Lab 11/10/12 1221 11/04/12 2145  LIPASE 63* 26  AMYLASE -- --   No results found for this basename: AMMONIA:2 in the last 168 hours CBC:  Lab 11/11/12 0458 11/10/12 1221  WBC 9.8 8.6  NEUTROABS -- 6.5  HGB 14.8 15.9  HCT 43.3 45.6  MCV 92.1 91.6  PLT 223 232   Cardiac Enzymes:  Lab 11/05/12 1752 11/05/12 1114 11/05/12 0653  CKTOTAL -- 86 --  CKMB -- 2.0 --  CKMBINDEX -- -- --  TROPONINI 1.16* 0.88* 1.05*   BNP: No components found with this basename: POCBNP:2 CBG: No results found for this basename: GLUCAP:5 in the  last 168 hours   Micro Results: Recent Results (from the past 240 hour(s))  MRSA PCR SCREENING     Status: Normal   Collection Time   11/05/12  3:46 AM      Component Value Range Status Comment   MRSA by PCR NEGATIVE  NEGATIVE Final   MRSA PCR SCREENING     Status: Normal   Collection Time   11/10/12 11:30 PM      Component Value Range Status Comment   MRSA by PCR NEGATIVE  NEGATIVE Final     Studies/Results: US Scrotum  11/10/2012  *RADIOLOGY REPORT*  Clinical Data:  Right scrotal swelling and pain.  Right  inguinal pain.  Prior hernia repair.  SCROTAL ULTRASOUND DOPPLER ULTRASOUND OF THE TESTICLES  Technique: Complete ultrasound examination of the testicles, epididymis, and other scrotal structures was performed.  Color and spectral Doppler ultrasound were also utilized to evaluate blood flow to the testicles.  Comparison:  11/10/2012  Findings:  Right testis:  Measures 5.1 x 2.5 x 3.5 cm, with normal echogenicity and echotexture.  Left testis:  Measures 5.1 x 2.5 x 3.0 cm, with normal echogenicity and echotexture.  Right epididymis:  Normal in size and appearance.  Left epididymis:  Normal in size and appearance.  Hydrocele:  Large right hydrocele.  Trace left hydrocele.  Varicocele:  Absent  Pulsed Doppler interrogation of both testes demonstrates low resistance flow bilaterally. No findings of torsion.  Vascularity of the testicles appears symmetric.  No discrete sonographic abnormality is observed in the right inguinal region in the area of the patient's pain.  IMPRESSION:  1.  Large right and trace left hydroceles.  Normal sonographic appearance of the testicles and epididymides.   Original Report Authenticated By: Gaylyn Rong, M.D.    Ct Abdomen Pelvis W Contrast  11/10/2012  *RADIOLOGY REPORT*  Clinical Data: Inguinal pain, prior hernia repair with mesh  CT ABDOMEN AND PELVIS WITH CONTRAST  Technique:  Multidetector CT imaging of the abdomen and pelvis was performed following the standard protocol during bolus administration of intravenous contrast.  Contrast: OMNIPAQUE IOHEXOL 300 MG/ML  SOLN  Comparison: 10/08/2012  Findings: Defibrillator leads partly visualized.  The lung bases are clear.  7 mm posterior segment right hepatic lobe too small to characterize hypodensity image 32 is stable. Excretion of contrast into nondilated renal collecting systems is noted.  Adrenal glands, kidneys, gallbladder, spleen, and pancreas are normal.  No lymphadenopathy, free fluid, or free air.  No bowel wall  thickening or focal segmental dilatation.  The appendix is normal. Bladder is normal.  No current inguinal hernia on this static exam or evidence for inguinal mass lesion or fluid collection.  No acute osseous abnormality.  IMPRESSION: No acute intra-abdominal or pelvic pathology.   Original Report Authenticated By: Christiana Pellant, M.D.    [place] Art/ven Flow Abd Pelv Doppler  11/10/2012  *RADIOLOGY REPORT*  Clinical Data:  Right scrotal swelling and pain.  Right inguinal pain.  Prior hernia repair.  SCROTAL ULTRASOUND DOPPLER ULTRASOUND OF THE TESTICLES  Technique: Complete ultrasound examination of the testicles, epididymis, and other scrotal structures was performed.  Color and spectral Doppler ultrasound were also utilized to evaluate blood flow to the testicles.  Comparison:  11/10/2012  Findings:  Right testis:  Measures 5.1 x 2.5 x 3.5 cm, with normal echogenicity and echotexture.  Left testis:  Measures 5.1 x 2.5 x 3.0 cm, with normal echogenicity and echotexture.  Right epididymis:  Normal in size and appearance.  Left epididymis:  Normal in size and appearance.  Hydrocele:  Large right hydrocele.  Trace left hydrocele.  Varicocele:  Absent  Pulsed Doppler interrogation of both testes demonstrates low resistance flow bilaterally. No findings of torsion.  Vascularity of the testicles appears symmetric.  No discrete sonographic abnormality is observed in the right inguinal region in the area of the patient's pain.  IMPRESSION:  1.  Large right and trace left hydroceles.  Normal sonographic appearance of the testicles and epididymides.   Original Report Authenticated By: Gaylyn Rong, M.D.    Dg Chest Port 1 View  11/04/2012  *RADIOLOGY REPORT*  Clinical Data: Chest pain.  PORTABLE CHEST - 1 VIEW  Comparison: Chest radiograph from 06/26/2012  Findings: The lungs are well-aerated.  Mild chronically increased interstitial markings are noted.  There is no evidence of focal opacification, pleural effusion  or pneumothorax.  The cardiomediastinal silhouette is normal in size.  A pacemaker/AICD is noted overlying the left chest wall, with leads ending overlying the right atrium and right ventricle.  No acute osseous abnormalities are seen.  IMPRESSION: No acute cardiopulmonary process seen; mild chronic lung changes noted.   Original Report Authenticated By: Tonia Ghent, M.D.     Medications: Scheduled Meds:   . amoxicillin  500 mg Oral TID  . aspirin EC  81 mg Oral Daily  . docusate sodium  100 mg Oral BID  . enoxaparin (LOVENOX) injection  40 mg Subcutaneous Q24H  . isosorbide mononitrate  30 mg Oral Daily  . lisinopril  10 mg Oral QHS  . metoprolol  50 mg Oral BID  . pantoprazole  40 mg Oral Daily  . simvastatin  40 mg Oral QPM  . sodium chloride  3 mL Intravenous Q12H  . Ticagrelor  90 mg Oral BID      LOS: 1 day   Cid Agena M.D. [facility] 11/11/2012, 10:54 AM Pager: [phone]  If 7PM-7AM, please contact night-coverage [email or web address] Password [email or web address]

## 2012-11-12 DIAGNOSIS — N509 Disorder of male genital organs, unspecified: Secondary | ICD-10-CM

## 2012-11-12 DIAGNOSIS — R109 Unspecified abdominal pain: Secondary | ICD-10-CM

## 2012-11-12 DIAGNOSIS — N433 Hydrocele, unspecified: Secondary | ICD-10-CM

## 2012-11-12 MED ORDER — OXYCODONE-ACETAMINOPHEN 5-325 MG PO TABS: 1.0000 | ORAL_TABLET | ORAL | Status: DC | PRN

## 2012-11-12 NOTE — Discharge Summary (Signed)
Physician Discharge Summary  Patient ID: Phillippe Orlick MRN: [id number] DOB/AGE: 1966/08/23 47 y.o.  Admit date: 11/10/2012 Discharge date: 11/12/2012  Discharge Diagnoses:  Active Problems:  STEMI, LAD DES 12/30/11  ICD in place, BS, implanted 2007, one discharge am of STEMI 2/13  PSVT 2/13, briefly on Amiodarone  Ischemic cardiomyopathy, EF 40%, 2D Feb 2013  Chronic back pain  Abdominal  pain, other specified site  Uncontrolled pain hydrocele    Medication List     As of 11/12/2012  9:09 AM    STOP taking these medications         HYDROcodone-acetaminophen 7.5-500 MG per tablet   Commonly known as: LORTAB      TAKE these medications         amoxicillin 500 MG capsule   Commonly known as: AMOXIL   Take 500 mg by mouth 3 (three) times daily. Started 10/31/12; 10 day course of therapy.      aspirin EC 81 MG tablet   Take 81 mg by mouth daily.      calcium carbonate 500 MG chewable tablet   Commonly known as: TUMS - dosed in mg elemental calcium   Chew 2 tablets by mouth 3 (three) times daily as needed. For indigestion      isosorbide mononitrate 30 MG 24 hr tablet   Commonly known as: IMDUR   Take 1 tablet (30 mg total) by mouth daily.      lisinopril 10 MG tablet   Commonly known as: PRINIVIL,ZESTRIL   Take 10 mg by mouth at bedtime.      metoprolol 50 MG tablet   Commonly known as: LOPRESSOR   Take 1 tablet (50 mg total) by mouth 2 (two) times daily.      nitroGLYCERIN 0.4 MG SL tablet   Commonly known as: NITROSTAT   Place 0.4 mg under the tongue every 5 (five) minutes as needed. For chest pain      oxyCODONE-acetaminophen 5-325 MG per tablet   Commonly known as: PERCOCET/ROXICET   Take 1-2 tablets by mouth every 4 (four) hours as needed for pain.      pantoprazole 40 MG tablet   Commonly known as: PROTONIX   Take 40 mg by mouth daily.      simvastatin 40 MG tablet   Commonly known as: ZOCOR   Take 40 mg by mouth every evening.      Ticagrelor 90 MG Tabs  tablet   Commonly known as: BRILINTA   Take 1 tablet (90 mg total) by mouth 2 (two) times daily.            Discharge Orders    Future Orders Please Complete By Expires   Diet - low sodium heart healthy      Increase activity slowly         Follow-up Information    Follow up with your primary care provider. (If symptoms worsen)          Disposition: 01-Home or Self Care  Discharged Condition:  Stable  Consults:   None  Labs:   Results for orders placed during the hospital encounter of 11/10/12 (from the past 48 hour(s))  COMPREHENSIVE METABOLIC PANEL     Status: Abnormal   Collection Time   11/10/12 12:21 PM      Component Value Range Comment   Sodium 140  135 - 145 mEq/L    Potassium 4.0  3.5 - 5.1 mEq/L    Chloride 102  96 - 112 mEq/L  CO2 27  19 - 32 mEq/L    Glucose, Bld 111 (*) 70 - 99 mg/dL    BUN 7  6 - 23 mg/dL    Creatinine, Ser 1.61  0.50 - 1.35 mg/dL    Calcium 9.4  8.4 - 09.6 mg/dL    Total Protein 7.3  6.0 - 8.3 g/dL    Albumin 3.8  3.5 - 5.2 g/dL    AST 30  0 - 37 U/L    ALT 34  0 - 53 U/L    Alkaline Phosphatase 38 (*) 39 - 117 U/L    Total Bilirubin 0.3  0.3 - 1.2 mg/dL    GFR calc non Af Amer >90  >90 mL/min    GFR calc Af Amer >90  >90 mL/min   LIPASE, BLOOD     Status: Abnormal   Collection Time   11/10/12 12:21 PM      Component Value Range Comment   Lipase 63 (*) 11 - 59 U/L   CBC WITH DIFFERENTIAL     Status: Normal   Collection Time   11/10/12 12:21 PM      Component Value Range Comment   WBC 8.6  4.0 - 10.5 K/uL    RBC 4.98  4.22 - 5.81 MIL/uL    Hemoglobin 15.9  13.0 - 17.0 g/dL    HCT 04.5  40.9 - 81.1 %    MCV 91.6  78.0 - 100.0 fL    MCH 31.9  26.0 - 34.0 pg    MCHC 34.9  30.0 - 36.0 g/dL    RDW 91.4  78.2 - 95.6 %    Platelets 232  150 - 400 K/uL    Neutrophils Relative 76  43 - 77 %    Neutro Abs 6.5  1.7 - 7.7 K/uL    Lymphocytes Relative 18  12 - 46 %    Lymphs Abs 1.5  0.7 - 4.0 K/uL    Monocytes Relative 6  3 - 12  %    Monocytes Absolute 0.5  0.1 - 1.0 K/uL    Eosinophils Relative 1  0 - 5 %    Eosinophils Absolute 0.1  0.0 - 0.7 K/uL    Basophils Relative 0  0 - 1 %    Basophils Absolute 0.0  0.0 - 0.1 K/uL   URINALYSIS, ROUTINE W REFLEX MICROSCOPIC     Status: Normal   Collection Time   11/10/12  1:53 PM      Component Value Range Comment   Color, Urine YELLOW  YELLOW    APPearance CLEAR  CLEAR    Specific Gravity, Urine 1.021  1.005 - 1.030    pH 8.0  5.0 - 8.0    Glucose, UA NEGATIVE  NEGATIVE mg/dL    Hgb urine dipstick NEGATIVE  NEGATIVE    Bilirubin Urine NEGATIVE  NEGATIVE    Ketones, ur NEGATIVE  NEGATIVE mg/dL    Protein, ur NEGATIVE  NEGATIVE mg/dL    Urobilinogen, UA 1.0  0.0 - 1.0 mg/dL    Nitrite NEGATIVE  NEGATIVE    Leukocytes, UA NEGATIVE  NEGATIVE MICROSCOPIC NOT DONE ON URINES WITH NEGATIVE PROTEIN, BLOOD, LEUKOCYTES, NITRITE, OR GLUCOSE <1000 mg/dL.  MRSA PCR SCREENING     Status: Normal   Collection Time   11/10/12 11:30 PM      Component Value Range Comment   MRSA by PCR NEGATIVE  NEGATIVE   URINE RAPID DRUG SCREEN (HOSP PERFORMED)  Status: Abnormal   Collection Time   11/11/12 12:31 AM      Component Value Range Comment   Opiates POSITIVE (*) NONE DETECTED    Cocaine NONE DETECTED  NONE DETECTED    Benzodiazepines NONE DETECTED  NONE DETECTED    Amphetamines NONE DETECTED  NONE DETECTED    Tetrahydrocannabinol POSITIVE (*) NONE DETECTED    Barbiturates NONE DETECTED  NONE DETECTED   BASIC METABOLIC PANEL     Status: Abnormal   Collection Time   11/11/12  4:58 AM      Component Value Range Comment   Sodium 137  135 - 145 mEq/L    Potassium 3.3 (*) 3.5 - 5.1 mEq/L DELTA CHECK NOTED   Chloride 100  96 - 112 mEq/L    CO2 27  19 - 32 mEq/L    Glucose, Bld 87  70 - 99 mg/dL    BUN 6  6 - 23 mg/dL    Creatinine, Ser 7.82  0.50 - 1.35 mg/dL    Calcium 8.9  8.4 - 95.6 mg/dL    GFR calc non Af Amer 86 (*) >90 mL/min    GFR calc Af Amer >90  >90 mL/min   CBC      Status: Normal   Collection Time   11/11/12  4:58 AM      Component Value Range Comment   WBC 9.8  4.0 - 10.5 K/uL    RBC 4.70  4.22 - 5.81 MIL/uL    Hemoglobin 14.8  13.0 - 17.0 g/dL    HCT 21.3  08.6 - 57.8 %    MCV 92.1  78.0 - 100.0 fL    MCH 31.5  26.0 - 34.0 pg    MCHC 34.2  30.0 - 36.0 g/dL    RDW 46.9  62.9 - 52.8 %    Platelets 223  150 - 400 K/uL   GAMMA GT     Status: Abnormal   Collection Time   11/11/12  4:58 AM      Component Value Range Comment   GGT 65 (*) 7 - 51 U/L     Diagnostics:  US Scrotum  11/10/2012  *RADIOLOGY REPORT*  Clinical Data:  Right scrotal swelling and pain.  Right inguinal pain.  Prior hernia repair.  SCROTAL ULTRASOUND DOPPLER ULTRASOUND OF THE TESTICLES  Technique: Complete ultrasound examination of the testicles, epididymis, and other scrotal structures was performed.  Color and spectral Doppler ultrasound were also utilized to evaluate blood flow to the testicles.  Comparison:  11/10/2012  Findings:  Right testis:  Measures 5.1 x 2.5 x 3.5 cm, with normal echogenicity and echotexture.  Left testis:  Measures 5.1 x 2.5 x 3.0 cm, with normal echogenicity and echotexture.  Right epididymis:  Normal in size and appearance.  Left epididymis:  Normal in size and appearance.  Hydrocele:  Large right hydrocele.  Trace left hydrocele.  Varicocele:  Absent  Pulsed Doppler interrogation of both testes demonstrates low resistance flow bilaterally. No findings of torsion.  Vascularity of the testicles appears symmetric.  No discrete sonographic abnormality is observed in the right inguinal region in the area of the patient's pain.  IMPRESSION:  1.  Large right and trace left hydroceles.  Normal sonographic appearance of the testicles and epididymides.   Original Report Authenticated By: Gaylyn Rong, M.D.    Ct Abdomen Pelvis W Contrast  11/10/2012  *RADIOLOGY REPORT*  Clinical Data: Inguinal pain, prior hernia repair with mesh  CT ABDOMEN AND  PELVIS WITH CONTRAST   Technique:  Multidetector CT imaging of the abdomen and pelvis was performed following the standard protocol during bolus administration of intravenous contrast.  Contrast: OMNIPAQUE IOHEXOL 300 MG/ML  SOLN  Comparison: 10/08/2012  Findings: Defibrillator leads partly visualized.  The lung bases are clear.  7 mm posterior segment right hepatic lobe too small to characterize hypodensity image 32 is stable. Excretion of contrast into nondilated renal collecting systems is noted.  Adrenal glands, kidneys, gallbladder, spleen, and pancreas are normal.  No lymphadenopathy, free fluid, or free air.  No bowel wall thickening or focal segmental dilatation.  The appendix is normal. Bladder is normal.  No current inguinal hernia on this static exam or evidence for inguinal mass lesion or fluid collection.  No acute osseous abnormality.  IMPRESSION: No acute intra-abdominal or pelvic pathology.   Original Report Authenticated By: Christiana Pellant, M.D.    [place] Art/ven Flow Abd Pelv Doppler  11/10/2012  *RADIOLOGY REPORT*  Clinical Data:  Right scrotal swelling and pain.  Right inguinal pain.  Prior hernia repair.  SCROTAL ULTRASOUND DOPPLER ULTRASOUND OF THE TESTICLES  Technique: Complete ultrasound examination of the testicles, epididymis, and other scrotal structures was performed.  Color and spectral Doppler ultrasound were also utilized to evaluate blood flow to the testicles.  Comparison:  11/10/2012  Findings:  Right testis:  Measures 5.1 x 2.5 x 3.5 cm, with normal echogenicity and echotexture.  Left testis:  Measures 5.1 x 2.5 x 3.0 cm, with normal echogenicity and echotexture.  Right epididymis:  Normal in size and appearance.  Left epididymis:  Normal in size and appearance.  Hydrocele:  Large right hydrocele.  Trace left hydrocele.  Varicocele:  Absent  Pulsed Doppler interrogation of both testes demonstrates low resistance flow bilaterally. No findings of torsion.  Vascularity of the testicles appears  symmetric.  No discrete sonographic abnormality is observed in the right inguinal region in the area of the patient's pain.  IMPRESSION:  1.  Large right and trace left hydroceles.  Normal sonographic appearance of the testicles and epididymides.   Original Report Authenticated By: Gaylyn Rong, M.D.    Dg Chest Port 1 View  11/04/2012  *RADIOLOGY REPORT*  Clinical Data: Chest pain.  PORTABLE CHEST - 1 VIEW  Comparison: Chest radiograph from 06/26/2012  Findings: The lungs are well-aerated.  Mild chronically increased interstitial markings are noted.  There is no evidence of focal opacification, pleural effusion or pneumothorax.  The cardiomediastinal silhouette is normal in size.  A pacemaker/AICD is noted overlying the left chest wall, with leads ending overlying the right atrium and right ventricle.  No acute osseous abnormalities are seen.  IMPRESSION: No acute cardiopulmonary process seen; mild chronic lung changes noted.   Original Report Authenticated By: Tonia Ghent, M.D.    Full Code   Hospital Course: See H&P for complete admission details. The patient is a 47 year old white male who was wrestling with family member and was head butted in the scrotal area. He presented with pain in the right lower quadrant and inguinal area. CT of the abdomen pelvis was negative. He was evaluated by surgery. Scrotal ultrasound showed a large right hydrocele but negative for epididymitis. Patient's pain was unable to be controlled. He was therefore admitted for pain control. By the time of discharge, he was able to ambulate and the pain was down to a tolerable level. His other medical problems remain stable. Total time of day of discharge greater than 30 minutes.  Discharge Exam:  Blood pressure 126/74, pulse 60, temperature 97.6 F (36.4 C), temperature source Oral, resp. rate 19, weight 123.197 kg (271 lb 9.6 oz), SpO2 100.00%.  Lungs CTA CV RRR Abd S,NT,ND   Signed: Ramiel Forti  L 11/12/2012, 9:09 AM

## 2012-11-12 NOTE — Progress Notes (Signed)
Utilization Review Completed.Clemence Stillings T1/05/2013   

## 2012-11-13 NOTE — Discharge Summary (Signed)
Physician Discharge Summary  Patient ID: Gerald Simpson MRN: [id number] DOB/AGE: [birth year] 47 y.o.  Admit date: 11/04/2012 Discharge date: 11/13/2012  Admission Diagnoses: NSTEMI  Discharge Diagnoses:  Active Problems:  CAD, Hx of prior stents LAD and CFX 2005 in [place]  Chest pain, epigastric pain, due to NSTMI  Shortness of breath  Elevated troponin I level  Reactive airway disease   Discharged Condition: stable  Hospital Course:   The patient is a 47 y.o. Caucasian male with several recent admissions to [facility] in the past year for ACS. He has a remote history of coronary disease. He apparently had stents placed to the LAD and CFX in 2005, in [place], and also had a [employer] ICD placed in 2007, due to cardiomyopathy. In February 2013, he presented to [facility] with an ST-Elevations MI and had an LAD DES placed. His ICD was checked on admission and he had an ICD discharge for V-TACH. Echocardiogram at that time showed his ejection fraction to be 40%. In August, he presented back to the ED with CP, and an EKG showed subtle changes. A diagnostic cath was performed, which revealed patent sent sites with an 80% to 90% distal LAD that was a small vessel, less than 1 mm. The decision was made to treat him medically.   He reported back to the ED on 11/04/12 with a chief complaint of CP. He reported that the pain is very similar to his angina in the past. He first noticed the pain around 3:00pm while he was cleaning his house. It was described as a burning, substernal/epigastric pain. Constant in nature, with no relation to meals. He denied radiation to the arms, neck, jaw or back. No associated SOB, palpitations, lightheadedness/dizziness, n/v. He remembered being slightly diaphoretic during episodes. At its worse the pain was 3-4/10. Only aggravating factors noted are coughing and sneezing. He took Rolaids which provided pain relief for 20-30 minutes, before returning. He has no pain  currently. He also denies orthopnea, PND and LEE.   The patient was admitted to Inpatient services.  He ruled in for MI with a peak troponin of 1.16 although CKMB was 2.0.  P2Y12 was checked to assess responsiveness to Plavix:  240.  Due to this finding, he was changed to Brilinta.  Lopressor was increased and Imdur added.  It was felt that another coronary angiogram was not warranted given his recent study and findings.  The patient was discharged home in stable condition after being seen by Dr. Tresa Endo.    Consults: None  Significant Diagnostic Studies:  Dg Chest Port 1 View  11/04/2012 *RADIOLOGY REPORT* Clinical Data: Chest pain. PORTABLE CHEST - 1 VIEW Comparison: Chest radiograph from 06/26/2012 Findings: The lungs are well-aerated. Mild chronically increased interstitial markings are noted. There is no evidence of focal opacification, pleural effusion or pneumothorax. The cardiomediastinal silhouette is normal in size. A pacemaker/AICD is noted overlying the left chest wall, with leads ending overlying the right atrium and right ventricle. No acute osseous abnormalities are seen. IMPRESSION: No acute cardiopulmonary process seen; mild chronic lung changes noted. Original Report Authenticated By: Tonia Ghent, M.D.    Treatments: Plavix changed to brilinta, Imdur added, lopressor increased  Discharge Exam: Blood pressure 122/88, pulse 60, temperature 98.1 F (36.7 C), temperature source Oral, resp. rate 14, height 5\' 10"  (1.778 m), weight 124.3 kg (274 lb 0.5 oz), SpO2 98.00%.   Disposition: 01-Home or Self Care  Discharge Orders    Future Orders Please Complete By Expires  Diet - low sodium heart healthy      Increase activity slowly          Medication List     As of 11/13/2012  2:04 PM    STOP taking these medications         clopidogrel 75 MG tablet   Commonly known as: PLAVIX      HYDROcodone-acetaminophen 7.5-500 MG per tablet   Commonly known as: LORTAB      TAKE  these medications         amoxicillin 500 MG capsule   Commonly known as: AMOXIL   Take 500 mg by mouth 3 (three) times daily. Started 10/31/12; 10 day course of therapy.      aspirin EC 81 MG tablet   Take 81 mg by mouth daily.      calcium carbonate 500 MG chewable tablet   Commonly known as: TUMS - dosed in mg elemental calcium   Chew 2 tablets by mouth 3 (three) times daily as needed. For indigestion      isosorbide mononitrate 30 MG 24 hr tablet   Commonly known as: IMDUR   Take 1 tablet (30 mg total) by mouth daily.      lisinopril 10 MG tablet   Commonly known as: PRINIVIL,ZESTRIL   Take 10 mg by mouth at bedtime.      metoprolol 50 MG tablet   Commonly known as: LOPRESSOR   Take 1 tablet (50 mg total) by mouth 2 (two) times daily.      nitroGLYCERIN 0.4 MG SL tablet   Commonly known as: NITROSTAT   Place 0.4 mg under the tongue every 5 (five) minutes as needed. For chest pain      pantoprazole 40 MG tablet   Commonly known as: PROTONIX   Take 40 mg by mouth daily.      simvastatin 40 MG tablet   Commonly known as: ZOCOR   Take 40 mg by mouth every evening.      Ticagrelor 90 MG Tabs tablet   Commonly known as: BRILINTA   Take 1 tablet (90 mg total) by mouth 2 (two) times daily.           Follow-up Information    Follow up with Lennette Bihari, MD. (Our office will call you with the appt date and time.)    Contact information:   [address] [place] [address] [phone]          Signed: Wilburt Finlay 11/13/2012, 2:04 PM

## 2012-11-25 ENCOUNTER — Encounter (HOSPITAL_COMMUNITY): Payer: Self-pay | Admitting: *Deleted

## 2012-11-25 DIAGNOSIS — Z9861 Coronary angioplasty status: Secondary | ICD-10-CM | POA: Insufficient documentation

## 2012-11-25 DIAGNOSIS — R1110 Vomiting, unspecified: Secondary | ICD-10-CM | POA: Insufficient documentation

## 2012-11-25 DIAGNOSIS — Z79899 Other long term (current) drug therapy: Secondary | ICD-10-CM | POA: Insufficient documentation

## 2012-11-25 DIAGNOSIS — M545 Low back pain, unspecified: Secondary | ICD-10-CM | POA: Insufficient documentation

## 2012-11-25 DIAGNOSIS — Z7982 Long term (current) use of aspirin: Secondary | ICD-10-CM | POA: Insufficient documentation

## 2012-11-25 DIAGNOSIS — Z8781 Personal history of (healed) traumatic fracture: Secondary | ICD-10-CM | POA: Insufficient documentation

## 2012-11-25 DIAGNOSIS — F411 Generalized anxiety disorder: Secondary | ICD-10-CM | POA: Insufficient documentation

## 2012-11-25 DIAGNOSIS — N433 Hydrocele, unspecified: Secondary | ICD-10-CM | POA: Insufficient documentation

## 2012-11-25 DIAGNOSIS — I10 Essential (primary) hypertension: Secondary | ICD-10-CM | POA: Insufficient documentation

## 2012-11-25 DIAGNOSIS — R05 Cough: Secondary | ICD-10-CM | POA: Insufficient documentation

## 2012-11-25 DIAGNOSIS — I2510 Atherosclerotic heart disease of native coronary artery without angina pectoris: Secondary | ICD-10-CM | POA: Insufficient documentation

## 2012-11-25 DIAGNOSIS — Z9889 Other specified postprocedural states: Secondary | ICD-10-CM | POA: Insufficient documentation

## 2012-11-25 DIAGNOSIS — R059 Cough, unspecified: Secondary | ICD-10-CM | POA: Insufficient documentation

## 2012-11-25 DIAGNOSIS — Z95810 Presence of automatic (implantable) cardiac defibrillator: Secondary | ICD-10-CM | POA: Insufficient documentation

## 2012-11-25 DIAGNOSIS — G8929 Other chronic pain: Secondary | ICD-10-CM | POA: Insufficient documentation

## 2012-11-25 DIAGNOSIS — Z87828 Personal history of other (healed) physical injury and trauma: Secondary | ICD-10-CM | POA: Insufficient documentation

## 2012-11-25 DIAGNOSIS — Z8739 Personal history of other diseases of the musculoskeletal system and connective tissue: Secondary | ICD-10-CM | POA: Insufficient documentation

## 2012-11-25 DIAGNOSIS — B9789 Other viral agents as the cause of diseases classified elsewhere: Secondary | ICD-10-CM | POA: Insufficient documentation

## 2012-11-25 DIAGNOSIS — Z8719 Personal history of other diseases of the digestive system: Secondary | ICD-10-CM | POA: Insufficient documentation

## 2012-11-25 DIAGNOSIS — R109 Unspecified abdominal pain: Secondary | ICD-10-CM | POA: Insufficient documentation

## 2012-11-25 DIAGNOSIS — M109 Gout, unspecified: Secondary | ICD-10-CM | POA: Insufficient documentation

## 2012-11-25 DIAGNOSIS — I252 Old myocardial infarction: Secondary | ICD-10-CM | POA: Insufficient documentation

## 2012-11-25 NOTE — ED Notes (Signed)
The pt has had sob since  Friday with coughing constantly and he saw his regular doctor today.  Productive cough water thin

## 2012-11-25 NOTE — ED Notes (Signed)
No answer

## 2012-11-26 ENCOUNTER — Emergency Department (HOSPITAL_COMMUNITY): Payer: Medicaid Other

## 2012-11-26 ENCOUNTER — Emergency Department (HOSPITAL_COMMUNITY)
Admission: EM | Admit: 2012-11-26 | Discharge: 2012-11-26 | Disposition: A | Discharge status: Home / Self Care | Payer: Medicaid Other | Attending: Emergency Medicine | Admitting: Emergency Medicine

## 2012-11-26 DIAGNOSIS — R05 Cough: Secondary | ICD-10-CM

## 2012-11-26 DIAGNOSIS — N433 Hydrocele, unspecified: Secondary | ICD-10-CM

## 2012-11-26 DIAGNOSIS — R109 Unspecified abdominal pain: Secondary | ICD-10-CM

## 2012-11-26 DIAGNOSIS — R1110 Vomiting, unspecified: Secondary | ICD-10-CM

## 2012-11-26 DIAGNOSIS — B349 Viral infection, unspecified: Secondary | ICD-10-CM

## 2012-11-26 LAB — POCT I-STAT, CHEM 8
BUN: 8 mg/dL (ref 6–23)
Chloride: 106 mEq/L (ref 96–112)
Creatinine, Ser: 1.2 mg/dL (ref 0.50–1.35)
Sodium: 141 mEq/L (ref 135–145)
TCO2: 24 mmol/L (ref 0–100)

## 2012-11-26 LAB — POCT I-STAT TROPONIN I: Troponin i, poc: 0 ng/mL (ref 0.00–0.08)

## 2012-11-26 MED ORDER — ALBUTEROL SULFATE (5 MG/ML) 0.5% IN NEBU
5.0000 mg | INHALATION_SOLUTION | Freq: Once | RESPIRATORY_TRACT | Status: AC
  Administered 2012-11-26: 5 mg via RESPIRATORY_TRACT
  Filled 2012-11-26 (×2): qty 0.5

## 2012-11-26 MED ORDER — IPRATROPIUM BROMIDE 0.02 % IN SOLN
0.5000 mg | Freq: Once | RESPIRATORY_TRACT | Status: AC
  Administered 2012-11-26: 0.5 mg via RESPIRATORY_TRACT
  Filled 2012-11-26: qty 2.5

## 2012-11-26 MED ORDER — AEROCHAMBER PLUS W/MASK MISC
Status: AC
  Filled 2012-11-26: qty 1

## 2012-11-26 MED ORDER — AEROCHAMBER PLUS FLO-VU LARGE MISC
1.0000 | Freq: Once | Status: AC
  Administered 2012-11-26: 1
  Filled 2012-11-26: qty 1

## 2012-11-26 MED ORDER — OXYCODONE-ACETAMINOPHEN 5-325 MG PO TABS
2.0000 | ORAL_TABLET | Freq: Once | ORAL | Status: AC
  Administered 2012-11-26: 2 via ORAL
  Filled 2012-11-26: qty 2

## 2012-11-26 MED ORDER — LORATADINE 10 MG PO TABS
10.0000 mg | ORAL_TABLET | Freq: Once | ORAL | Status: AC
  Administered 2012-11-26: 10 mg via ORAL
  Filled 2012-11-26: qty 1

## 2012-11-26 MED ORDER — ALBUTEROL SULFATE HFA 108 (90 BASE) MCG/ACT IN AERS
2.0000 | INHALATION_SPRAY | RESPIRATORY_TRACT | Status: DC | PRN
  Administered 2012-11-26: 2 via RESPIRATORY_TRACT
  Filled 2012-11-26: qty 6.7

## 2012-11-26 MED ORDER — OXYCODONE-ACETAMINOPHEN 5-325 MG PO TABS: 1.0000 | ORAL_TABLET | Freq: Three times a day (TID) | ORAL | Status: DC | PRN

## 2012-11-26 NOTE — ED Notes (Signed)
Peak flow 120 - deep breathing triggering coughing in pt

## 2012-11-26 NOTE — ED Notes (Signed)
Xray notified pt ready for transport 
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
## 2012-11-26 NOTE — ED Notes (Signed)
Pt presents with shortness of breath and a cough since Friday, bilateral wheezing noted, breathing treatment started upon pt arrival to room.  Pt with productive cough, green in color.  Pt states he's having abdominal and testicle pain when he coughs as well.

## 2012-11-26 NOTE — ED Notes (Signed)
Unable to obtain EKG at this time. Pt's symptoms prevent him from holding still.

## 2012-12-31 ENCOUNTER — Emergency Department (HOSPITAL_BASED_OUTPATIENT_CLINIC_OR_DEPARTMENT_OTHER): Payer: Medicaid Other

## 2012-12-31 ENCOUNTER — Emergency Department (HOSPITAL_BASED_OUTPATIENT_CLINIC_OR_DEPARTMENT_OTHER)
Admission: EM | Admit: 2012-12-31 | Discharge: 2012-12-31 | Disposition: A | Discharge status: Home / Self Care | Payer: Medicaid Other | Attending: Emergency Medicine | Admitting: Emergency Medicine

## 2012-12-31 ENCOUNTER — Encounter (HOSPITAL_BASED_OUTPATIENT_CLINIC_OR_DEPARTMENT_OTHER): Payer: Self-pay | Admitting: *Deleted

## 2012-12-31 DIAGNOSIS — G8929 Other chronic pain: Secondary | ICD-10-CM | POA: Insufficient documentation

## 2012-12-31 DIAGNOSIS — M545 Low back pain, unspecified: Secondary | ICD-10-CM | POA: Insufficient documentation

## 2012-12-31 DIAGNOSIS — S93609A Unspecified sprain of unspecified foot, initial encounter: Secondary | ICD-10-CM | POA: Insufficient documentation

## 2012-12-31 DIAGNOSIS — Z8639 Personal history of other endocrine, nutritional and metabolic disease: Secondary | ICD-10-CM | POA: Insufficient documentation

## 2012-12-31 DIAGNOSIS — Z79899 Other long term (current) drug therapy: Secondary | ICD-10-CM | POA: Insufficient documentation

## 2012-12-31 DIAGNOSIS — Z950 Presence of cardiac pacemaker: Secondary | ICD-10-CM | POA: Insufficient documentation

## 2012-12-31 DIAGNOSIS — Z95810 Presence of automatic (implantable) cardiac defibrillator: Secondary | ICD-10-CM | POA: Insufficient documentation

## 2012-12-31 DIAGNOSIS — Y939 Activity, unspecified: Secondary | ICD-10-CM | POA: Insufficient documentation

## 2012-12-31 DIAGNOSIS — Z7902 Long term (current) use of antithrombotics/antiplatelets: Secondary | ICD-10-CM | POA: Insufficient documentation

## 2012-12-31 DIAGNOSIS — I2510 Atherosclerotic heart disease of native coronary artery without angina pectoris: Secondary | ICD-10-CM | POA: Insufficient documentation

## 2012-12-31 DIAGNOSIS — Y929 Unspecified place or not applicable: Secondary | ICD-10-CM | POA: Insufficient documentation

## 2012-12-31 DIAGNOSIS — Z8659 Personal history of other mental and behavioral disorders: Secondary | ICD-10-CM | POA: Insufficient documentation

## 2012-12-31 DIAGNOSIS — Z7982 Long term (current) use of aspirin: Secondary | ICD-10-CM | POA: Insufficient documentation

## 2012-12-31 DIAGNOSIS — Z8781 Personal history of (healed) traumatic fracture: Secondary | ICD-10-CM | POA: Insufficient documentation

## 2012-12-31 DIAGNOSIS — I10 Essential (primary) hypertension: Secondary | ICD-10-CM | POA: Insufficient documentation

## 2012-12-31 DIAGNOSIS — Z862 Personal history of diseases of the blood and blood-forming organs and certain disorders involving the immune mechanism: Secondary | ICD-10-CM | POA: Insufficient documentation

## 2012-12-31 DIAGNOSIS — I252 Old myocardial infarction: Secondary | ICD-10-CM | POA: Insufficient documentation

## 2012-12-31 DIAGNOSIS — E785 Hyperlipidemia, unspecified: Secondary | ICD-10-CM | POA: Insufficient documentation

## 2012-12-31 DIAGNOSIS — Z8719 Personal history of other diseases of the digestive system: Secondary | ICD-10-CM | POA: Insufficient documentation

## 2012-12-31 DIAGNOSIS — S93602A Unspecified sprain of left foot, initial encounter: Secondary | ICD-10-CM

## 2012-12-31 DIAGNOSIS — X58XXXA Exposure to other specified factors, initial encounter: Secondary | ICD-10-CM | POA: Insufficient documentation

## 2012-12-31 DIAGNOSIS — Z9861 Coronary angioplasty status: Secondary | ICD-10-CM | POA: Insufficient documentation

## 2012-12-31 MED ORDER — OXYCODONE-ACETAMINOPHEN 5-325 MG PO TABS: 2.0000 | ORAL_TABLET | ORAL | Status: DC | PRN
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
## 2012-12-31 NOTE — ED Notes (Signed)
Pt brought in by EMS for ankle injury x 2 hrs ago hx chronic pain

## 2013-01-01 NOTE — ED Provider Notes (Signed)
Medical screening examination/treatment/procedure(s) were performed by non-physician practitioner and as supervising physician I was immediately available for consultation/collaboration.   Carleene Cooper III, MD 01/01/13 [phone]

## 2013-01-08 ENCOUNTER — Encounter (HOSPITAL_COMMUNITY): Payer: Self-pay | Admitting: *Deleted

## 2013-01-11 ENCOUNTER — Emergency Department (HOSPITAL_COMMUNITY)
Admission: EM | Admit: 2013-01-11 | Discharge: 2013-01-11 | Discharge status: Against Medical Advice | Payer: Medicaid Other | Attending: Emergency Medicine | Admitting: Emergency Medicine

## 2013-01-11 ENCOUNTER — Encounter (HOSPITAL_BASED_OUTPATIENT_CLINIC_OR_DEPARTMENT_OTHER): Payer: Self-pay | Admitting: Emergency Medicine

## 2013-01-11 ENCOUNTER — Emergency Department (HOSPITAL_BASED_OUTPATIENT_CLINIC_OR_DEPARTMENT_OTHER)
Admission: EM | Admit: 2013-01-11 | Discharge: 2013-01-11 | Disposition: A | Discharge status: Home / Self Care | Payer: Medicaid Other | Attending: Emergency Medicine | Admitting: Emergency Medicine

## 2013-01-11 DIAGNOSIS — L600 Ingrowing nail: Secondary | ICD-10-CM | POA: Insufficient documentation

## 2013-01-11 DIAGNOSIS — I252 Old myocardial infarction: Secondary | ICD-10-CM | POA: Insufficient documentation

## 2013-01-11 DIAGNOSIS — E785 Hyperlipidemia, unspecified: Secondary | ICD-10-CM | POA: Insufficient documentation

## 2013-01-11 DIAGNOSIS — Z8781 Personal history of (healed) traumatic fracture: Secondary | ICD-10-CM | POA: Insufficient documentation

## 2013-01-11 DIAGNOSIS — Z7982 Long term (current) use of aspirin: Secondary | ICD-10-CM | POA: Insufficient documentation

## 2013-01-11 DIAGNOSIS — Z951 Presence of aortocoronary bypass graft: Secondary | ICD-10-CM | POA: Insufficient documentation

## 2013-01-11 DIAGNOSIS — I10 Essential (primary) hypertension: Secondary | ICD-10-CM | POA: Insufficient documentation

## 2013-01-11 DIAGNOSIS — Z862 Personal history of diseases of the blood and blood-forming organs and certain disorders involving the immune mechanism: Secondary | ICD-10-CM | POA: Insufficient documentation

## 2013-01-11 DIAGNOSIS — Z95810 Presence of automatic (implantable) cardiac defibrillator: Secondary | ICD-10-CM | POA: Insufficient documentation

## 2013-01-11 DIAGNOSIS — Z8739 Personal history of other diseases of the musculoskeletal system and connective tissue: Secondary | ICD-10-CM | POA: Insufficient documentation

## 2013-01-11 DIAGNOSIS — I2510 Atherosclerotic heart disease of native coronary artery without angina pectoris: Secondary | ICD-10-CM | POA: Insufficient documentation

## 2013-01-11 DIAGNOSIS — Z8719 Personal history of other diseases of the digestive system: Secondary | ICD-10-CM | POA: Insufficient documentation

## 2013-01-11 DIAGNOSIS — Z8639 Personal history of other endocrine, nutritional and metabolic disease: Secondary | ICD-10-CM | POA: Insufficient documentation

## 2013-01-11 DIAGNOSIS — Z79899 Other long term (current) drug therapy: Secondary | ICD-10-CM | POA: Insufficient documentation

## 2013-01-11 DIAGNOSIS — Z8659 Personal history of other mental and behavioral disorders: Secondary | ICD-10-CM | POA: Insufficient documentation

## 2013-01-11 DIAGNOSIS — L089 Local infection of the skin and subcutaneous tissue, unspecified: Secondary | ICD-10-CM | POA: Insufficient documentation

## 2013-01-11 MED ORDER — OXYCODONE-ACETAMINOPHEN 5-325 MG PO TABS
2.0000 | ORAL_TABLET | Freq: Once | ORAL | Status: AC
  Administered 2013-01-11: 2 via ORAL
  Filled 2013-01-11 (×2): qty 2

## 2013-01-11 MED ORDER — OXYCODONE-ACETAMINOPHEN 5-325 MG PO TABS: 2.0000 | ORAL_TABLET | ORAL | Status: DC | PRN

## 2013-01-11 NOTE — ED Notes (Signed)
Pt states he has ingrown toenail for one week.  Some drainage since Thursday.
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
## 2013-01-11 NOTE — ED Notes (Signed)
Suture cart and I & D tray is set up and ready at the bedside for the doctor to use. The patient's shoe is off and chuck is under his foot.

## 2013-03-02 ENCOUNTER — Emergency Department (HOSPITAL_COMMUNITY)
Admission: EM | Admit: 2013-03-02 | Discharge: 2013-03-02 | Disposition: A | Discharge status: Home / Self Care | Payer: Medicaid Other | Attending: Emergency Medicine | Admitting: Emergency Medicine

## 2013-03-02 DIAGNOSIS — Z8719 Personal history of other diseases of the digestive system: Secondary | ICD-10-CM | POA: Insufficient documentation

## 2013-03-02 DIAGNOSIS — E785 Hyperlipidemia, unspecified: Secondary | ICD-10-CM | POA: Insufficient documentation

## 2013-03-02 DIAGNOSIS — Z8659 Personal history of other mental and behavioral disorders: Secondary | ICD-10-CM | POA: Insufficient documentation

## 2013-03-02 DIAGNOSIS — G8929 Other chronic pain: Secondary | ICD-10-CM | POA: Insufficient documentation

## 2013-03-02 DIAGNOSIS — Z8679 Personal history of other diseases of the circulatory system: Secondary | ICD-10-CM | POA: Insufficient documentation

## 2013-03-02 DIAGNOSIS — Z862 Personal history of diseases of the blood and blood-forming organs and certain disorders involving the immune mechanism: Secondary | ICD-10-CM | POA: Insufficient documentation

## 2013-03-02 DIAGNOSIS — I252 Old myocardial infarction: Secondary | ICD-10-CM | POA: Insufficient documentation

## 2013-03-02 DIAGNOSIS — K047 Periapical abscess without sinus: Secondary | ICD-10-CM | POA: Insufficient documentation

## 2013-03-02 DIAGNOSIS — Z79899 Other long term (current) drug therapy: Secondary | ICD-10-CM | POA: Insufficient documentation

## 2013-03-02 DIAGNOSIS — Z7982 Long term (current) use of aspirin: Secondary | ICD-10-CM | POA: Insufficient documentation

## 2013-03-02 DIAGNOSIS — Z8639 Personal history of other endocrine, nutritional and metabolic disease: Secondary | ICD-10-CM | POA: Insufficient documentation

## 2013-03-02 DIAGNOSIS — M26609 Unspecified temporomandibular joint disorder, unspecified side: Secondary | ICD-10-CM

## 2013-03-02 DIAGNOSIS — M549 Dorsalgia, unspecified: Secondary | ICD-10-CM | POA: Insufficient documentation

## 2013-03-02 DIAGNOSIS — Z8781 Personal history of (healed) traumatic fracture: Secondary | ICD-10-CM | POA: Insufficient documentation

## 2013-03-02 DIAGNOSIS — I10 Essential (primary) hypertension: Secondary | ICD-10-CM | POA: Insufficient documentation

## 2013-03-02 MED ORDER — PENICILLIN V POTASSIUM 500 MG PO TABS: 500.0000 mg | ORAL_TABLET | Freq: Four times a day (QID) | ORAL | Status: AC

## 2013-03-02 MED ORDER — HYDROCODONE-ACETAMINOPHEN 5-325 MG PO TABS: 2.0000 | ORAL_TABLET | Freq: Four times a day (QID) | ORAL | Status: DC | PRN

## 2013-03-02 NOTE — ED Provider Notes (Signed)
Medical screening examination/treatment/procedure(s) were performed by non-physician practitioner and as supervising physician I was immediately available for consultation/collaboration.  Ethelda Chick, MD 03/02/13 1949
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
## 2013-03-02 NOTE — ED Notes (Signed)
Pt c/o L ear pain which started Friday. Pt states the entire L side of his head is tender. States pain radiates behind L eye and down L side of neck. No obvious mass noted. Pt denies drainage from L ear. Pt states pain got much worse after he poured peroxide in his ear this morning. Pt states he has pain to the L side of his throat when he swallows. Pt with no acute distress. 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

## 2013-03-18 ENCOUNTER — Emergency Department (HOSPITAL_COMMUNITY): Payer: Medicaid Other

## 2013-03-18 ENCOUNTER — Encounter (HOSPITAL_COMMUNITY): Payer: Self-pay | Admitting: Emergency Medicine

## 2013-03-18 ENCOUNTER — Emergency Department (HOSPITAL_COMMUNITY)
Admission: EM | Admit: 2013-03-18 | Discharge: 2013-03-18 | Disposition: A | Discharge status: Home / Self Care | Payer: Medicaid Other | Attending: Emergency Medicine | Admitting: Emergency Medicine

## 2013-03-18 DIAGNOSIS — Z79899 Other long term (current) drug therapy: Secondary | ICD-10-CM | POA: Insufficient documentation

## 2013-03-18 DIAGNOSIS — I10 Essential (primary) hypertension: Secondary | ICD-10-CM | POA: Insufficient documentation

## 2013-03-18 DIAGNOSIS — I739 Peripheral vascular disease, unspecified: Secondary | ICD-10-CM | POA: Insufficient documentation

## 2013-03-18 DIAGNOSIS — Z87828 Personal history of other (healed) physical injury and trauma: Secondary | ICD-10-CM | POA: Insufficient documentation

## 2013-03-18 DIAGNOSIS — G8929 Other chronic pain: Secondary | ICD-10-CM | POA: Insufficient documentation

## 2013-03-18 DIAGNOSIS — Z8719 Personal history of other diseases of the digestive system: Secondary | ICD-10-CM | POA: Insufficient documentation

## 2013-03-18 DIAGNOSIS — I252 Old myocardial infarction: Secondary | ICD-10-CM | POA: Insufficient documentation

## 2013-03-18 DIAGNOSIS — Z8781 Personal history of (healed) traumatic fracture: Secondary | ICD-10-CM | POA: Insufficient documentation

## 2013-03-18 DIAGNOSIS — Z95810 Presence of automatic (implantable) cardiac defibrillator: Secondary | ICD-10-CM | POA: Insufficient documentation

## 2013-03-18 DIAGNOSIS — Z950 Presence of cardiac pacemaker: Secondary | ICD-10-CM | POA: Insufficient documentation

## 2013-03-18 DIAGNOSIS — M109 Gout, unspecified: Secondary | ICD-10-CM | POA: Insufficient documentation

## 2013-03-18 DIAGNOSIS — I2510 Atherosclerotic heart disease of native coronary artery without angina pectoris: Secondary | ICD-10-CM | POA: Insufficient documentation

## 2013-03-18 DIAGNOSIS — F411 Generalized anxiety disorder: Secondary | ICD-10-CM | POA: Insufficient documentation

## 2013-03-18 DIAGNOSIS — E785 Hyperlipidemia, unspecified: Secondary | ICD-10-CM | POA: Insufficient documentation

## 2013-03-18 DIAGNOSIS — Z7982 Long term (current) use of aspirin: Secondary | ICD-10-CM | POA: Insufficient documentation

## 2013-03-18 DIAGNOSIS — M25579 Pain in unspecified ankle and joints of unspecified foot: Secondary | ICD-10-CM | POA: Insufficient documentation

## 2013-03-18 LAB — BASIC METABOLIC PANEL
BUN: 8 mg/dL (ref 6–23)
CO2: 24 mEq/L (ref 19–32)
Chloride: 102 mEq/L (ref 96–112)
Creatinine, Ser: 1.1 mg/dL (ref 0.50–1.35)
GFR calc Af Amer: >90 mL/min (ref >90)
Glucose, Bld: 96 mg/dL (ref 70–99)
Potassium: 3.9 mEq/L (ref 3.5–5.1)

## 2013-03-18 LAB — CBC WITH DIFFERENTIAL/PLATELET
HCT: 45.3 % (ref 39.0–52.0)
Hemoglobin: 16.2 g/dL (ref 13.0–17.0)
Lymphocytes Relative: 24 % (ref 12–46)
Lymphs Abs: 3 10*3/uL (ref 0.7–4.0)
MCHC: 35.8 g/dL (ref 30.0–36.0)
Monocytes Absolute: 1 10*3/uL (ref 0.1–1.0)
Monocytes Relative: 8 % (ref 3–12)
Neutro Abs: 8.2 10*3/uL — ABNORMAL HIGH (ref 1.7–7.7)
Neutrophils Relative %: 66 % (ref 43–77)
RBC: 4.99 MIL/uL (ref 4.22–5.81)
WBC: 12.4 10*3/uL — ABNORMAL HIGH (ref 4.0–10.5)

## 2013-03-18 MED ORDER — OXYCODONE-ACETAMINOPHEN 5-325 MG PO TABS
1.0000 | ORAL_TABLET | Freq: Once | ORAL | Status: AC
  Administered 2013-03-18: 1 via ORAL
  Filled 2013-03-18: qty 1

## 2013-03-18 MED ORDER — PERCOCET 5-325 MG PO TABS: 1.0000 | ORAL_TABLET | Freq: Four times a day (QID) | ORAL | Status: DC | PRN
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
## 2013-03-18 NOTE — ED Notes (Signed)
Patient transported to X-ray 

## 2013-03-18 NOTE — ED Notes (Signed)
Patient states he has been having bilateral ankle pain times 8 days. Patient states he has a history of gout and this pain feels the same. Pain is rated a 6/10. He advises that "when he tries to stand that his feet swell and turn red."

## 2013-03-18 NOTE — ED Notes (Signed)
Return from xray

## 2013-03-18 NOTE — ED Provider Notes (Signed)
Medical screening examination/treatment/procedure(s) were performed by non-physician practitioner and as supervising physician I was immediately available for consultation/collaboration.   Loren Racer, MD 03/18/13 2216

## 2013-03-20 ENCOUNTER — Emergency Department (HOSPITAL_COMMUNITY): Payer: Medicaid Other

## 2013-03-20 ENCOUNTER — Encounter (HOSPITAL_COMMUNITY): Payer: Self-pay | Admitting: Emergency Medicine

## 2013-03-20 ENCOUNTER — Emergency Department (HOSPITAL_COMMUNITY)
Admission: EM | Admit: 2013-03-20 | Discharge: 2013-03-20 | Disposition: A | Discharge status: Home / Self Care | Payer: Medicaid Other | Attending: Emergency Medicine | Admitting: Emergency Medicine

## 2013-03-20 DIAGNOSIS — R51 Headache: Secondary | ICD-10-CM | POA: Insufficient documentation

## 2013-03-20 DIAGNOSIS — I2510 Atherosclerotic heart disease of native coronary artery without angina pectoris: Secondary | ICD-10-CM | POA: Insufficient documentation

## 2013-03-20 DIAGNOSIS — R42 Dizziness and giddiness: Secondary | ICD-10-CM | POA: Insufficient documentation

## 2013-03-20 DIAGNOSIS — IMO0001 Reserved for inherently not codable concepts without codable children: Secondary | ICD-10-CM | POA: Insufficient documentation

## 2013-03-20 DIAGNOSIS — Z79899 Other long term (current) drug therapy: Secondary | ICD-10-CM | POA: Insufficient documentation

## 2013-03-20 DIAGNOSIS — M545 Low back pain, unspecified: Secondary | ICD-10-CM | POA: Insufficient documentation

## 2013-03-20 DIAGNOSIS — R0789 Other chest pain: Secondary | ICD-10-CM | POA: Insufficient documentation

## 2013-03-20 DIAGNOSIS — R0602 Shortness of breath: Secondary | ICD-10-CM | POA: Insufficient documentation

## 2013-03-20 DIAGNOSIS — Z8781 Personal history of (healed) traumatic fracture: Secondary | ICD-10-CM | POA: Insufficient documentation

## 2013-03-20 DIAGNOSIS — E785 Hyperlipidemia, unspecified: Secondary | ICD-10-CM | POA: Insufficient documentation

## 2013-03-20 DIAGNOSIS — R079 Chest pain, unspecified: Secondary | ICD-10-CM

## 2013-03-20 DIAGNOSIS — I252 Old myocardial infarction: Secondary | ICD-10-CM | POA: Insufficient documentation

## 2013-03-20 DIAGNOSIS — R071 Chest pain on breathing: Secondary | ICD-10-CM | POA: Insufficient documentation

## 2013-03-20 DIAGNOSIS — Z8639 Personal history of other endocrine, nutritional and metabolic disease: Secondary | ICD-10-CM | POA: Insufficient documentation

## 2013-03-20 DIAGNOSIS — Z8739 Personal history of other diseases of the musculoskeletal system and connective tissue: Secondary | ICD-10-CM | POA: Insufficient documentation

## 2013-03-20 DIAGNOSIS — F411 Generalized anxiety disorder: Secondary | ICD-10-CM | POA: Insufficient documentation

## 2013-03-20 DIAGNOSIS — Z7982 Long term (current) use of aspirin: Secondary | ICD-10-CM | POA: Insufficient documentation

## 2013-03-20 DIAGNOSIS — Z8719 Personal history of other diseases of the digestive system: Secondary | ICD-10-CM | POA: Insufficient documentation

## 2013-03-20 DIAGNOSIS — I10 Essential (primary) hypertension: Secondary | ICD-10-CM | POA: Insufficient documentation

## 2013-03-20 DIAGNOSIS — Z862 Personal history of diseases of the blood and blood-forming organs and certain disorders involving the immune mechanism: Secondary | ICD-10-CM | POA: Insufficient documentation

## 2013-03-20 LAB — BASIC METABOLIC PANEL
BUN: 14 mg/dL (ref 6–23)
Creatinine, Ser: 1.11 mg/dL (ref 0.50–1.35)
GFR calc Af Amer: >90 mL/min (ref >90)
GFR calc non Af Amer: 79 mL/min — ABNORMAL LOW (ref >90)

## 2013-03-20 LAB — CBC
HCT: 42.8 % (ref 39.0–52.0)
Hemoglobin: 15.5 g/dL (ref 13.0–17.0)
MCV: 89.5 fL (ref 78.0–100.0)
RDW: 13.3 % (ref 11.5–15.5)
WBC: 10.9 10*3/uL — ABNORMAL HIGH (ref 4.0–10.5)

## 2013-03-20 MED ORDER — PREDNISONE 20 MG PO TABS
60.0000 mg | ORAL_TABLET | ORAL | Status: AC
  Administered 2013-03-20: 60 mg via ORAL
  Filled 2013-03-20: qty 3

## 2013-03-20 MED ORDER — KETOROLAC TROMETHAMINE 30 MG/ML IJ SOLN
30.0000 mg | Freq: Once | INTRAMUSCULAR | Status: AC
  Administered 2013-03-20: 30 mg via INTRAVENOUS
  Filled 2013-03-20: qty 1

## 2013-03-20 MED ORDER — ALBUTEROL SULFATE HFA 108 (90 BASE) MCG/ACT IN AERS
2.0000 | INHALATION_SPRAY | Freq: Four times a day (QID) | RESPIRATORY_TRACT | Status: DC
  Administered 2013-03-20: 2 via RESPIRATORY_TRACT
  Filled 2013-03-20: qty 6.7

## 2013-03-20 MED ORDER — ASPIRIN 325 MG PO TABS
325.0000 mg | ORAL_TABLET | ORAL | Status: AC
  Administered 2013-03-20: 325 mg via ORAL
  Filled 2013-03-20: qty 1

## 2013-03-20 MED ORDER — PREDNISONE 20 MG PO TABS: 60.0000 mg | ORAL_TABLET | Freq: Every day | ORAL | Status: AC

## 2013-03-20 MED ORDER — ALBUTEROL SULFATE (5 MG/ML) 0.5% IN NEBU
5.0000 mg | INHALATION_SOLUTION | Freq: Once | RESPIRATORY_TRACT | Status: AC
  Administered 2013-03-20: 5 mg via RESPIRATORY_TRACT
  Filled 2013-03-20 (×2): qty 1

## 2013-03-20 NOTE — ED Notes (Signed)
Advised MD the patient would like something for pain.

## 2013-03-20 NOTE — ED Notes (Signed)
PT. REPORTS INTERMITTENT LEFT CHEST PAIN RADIATING TO LEFT BACK WITH SLIGHT SOB AND NAUSEA ONSET SEVERAL DAYS AGO , ALSO REPORTS HANDS AND FEET SWELLING FOR SEVERAL WEEKS AND ANXIETY ATTACK TODAY .
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
## 2013-03-20 NOTE — ED Notes (Signed)
The patient is AOx4 and comfortable with the discharge instructions. 

## 2013-03-21 ENCOUNTER — Encounter: Payer: Self-pay | Admitting: Cardiovascular Disease

## 2013-03-24 ENCOUNTER — Encounter: Payer: Self-pay | Admitting: Physician Assistant

## 2013-03-24 ENCOUNTER — Ambulatory Visit (INDEPENDENT_AMBULATORY_CARE_PROVIDER_SITE_OTHER): Payer: Medicaid Other | Admitting: Physician Assistant

## 2013-03-24 VITALS — BP 110/70 | HR 68 | Wt 280.0 lb

## 2013-03-24 DIAGNOSIS — I2510 Atherosclerotic heart disease of native coronary artery without angina pectoris: Secondary | ICD-10-CM

## 2013-03-24 DIAGNOSIS — E669 Obesity, unspecified: Secondary | ICD-10-CM

## 2013-03-24 DIAGNOSIS — M2550 Pain in unspecified joint: Secondary | ICD-10-CM

## 2013-03-24 MED ORDER — DICLOFENAC SODIUM 1 % TD GEL: 2.0000 g | Freq: Four times a day (QID) | TRANSDERMAL | Status: DC

## 2013-03-24 NOTE — Assessment & Plan Note (Addendum)
Currently on aspirin ACE beta blocker statin.

## 2013-03-24 NOTE — Assessment & Plan Note (Signed)
Discussed diet and exercise. Overdue current motion the pain he has not been able to do any walking.

## 2013-03-24 NOTE — Patient Instructions (Addendum)
Your physician has requested that you have a lower or upper extremity arterial duplex. This test is an ultrasound of the arteries in the legs or arms. It looks at arterial blood flow in the legs and arms. Allow one hour for Lower and Upper Arterial scans. There are no restrictions or special instructions. These are to be scheduled at next available opening.   Your physician recommends that you schedule a follow-up appointment in: 3 months with Dr. Tresa Endo.  Your physician recommends that you return for lab work today.
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
## 2013-03-24 NOTE — Assessment & Plan Note (Addendum)
We'll check lower short arterial Dopplers. However, the patient has 1+ or better distal pulses in his extremities are warm. Do not think his symptoms are necessarily related to a peripheral vascular disease but may more be related to gout.  Patient's apparent level of pain while at being examined was considerably greater than up I would expect from peripheral vascular disease while just sitting in a chair.  Also check uric acid levels and prescribe Voltaren gel.  The patient does take colchicine however, he can only take it until he develops diarrhea then he stops it for a while. If uric acid levels are elevated we'll consider adding allopurinol.

## 2013-04-07 ENCOUNTER — Encounter (HOSPITAL_COMMUNITY): Payer: Medicaid Other

## 2013-07-05 ENCOUNTER — Emergency Department (HOSPITAL_COMMUNITY): Payer: Medicaid - Out of State

## 2013-07-05 ENCOUNTER — Emergency Department (HOSPITAL_COMMUNITY)
Admission: EM | Admit: 2013-07-05 | Discharge: 2013-07-05 | Disposition: A | Discharge status: Home / Self Care | Payer: Medicaid - Out of State | Attending: Emergency Medicine | Admitting: Emergency Medicine

## 2013-07-05 ENCOUNTER — Encounter (HOSPITAL_COMMUNITY): Payer: Self-pay

## 2013-07-05 DIAGNOSIS — R109 Unspecified abdominal pain: Secondary | ICD-10-CM | POA: Insufficient documentation

## 2013-07-05 DIAGNOSIS — Z9861 Coronary angioplasty status: Secondary | ICD-10-CM | POA: Insufficient documentation

## 2013-07-05 DIAGNOSIS — Z7982 Long term (current) use of aspirin: Secondary | ICD-10-CM | POA: Insufficient documentation

## 2013-07-05 DIAGNOSIS — I10 Essential (primary) hypertension: Secondary | ICD-10-CM | POA: Insufficient documentation

## 2013-07-05 DIAGNOSIS — Z95810 Presence of automatic (implantable) cardiac defibrillator: Secondary | ICD-10-CM | POA: Insufficient documentation

## 2013-07-05 DIAGNOSIS — E785 Hyperlipidemia, unspecified: Secondary | ICD-10-CM | POA: Insufficient documentation

## 2013-07-05 DIAGNOSIS — I2510 Atherosclerotic heart disease of native coronary artery without angina pectoris: Secondary | ICD-10-CM | POA: Insufficient documentation

## 2013-07-05 DIAGNOSIS — M109 Gout, unspecified: Secondary | ICD-10-CM | POA: Insufficient documentation

## 2013-07-05 DIAGNOSIS — M545 Low back pain, unspecified: Secondary | ICD-10-CM | POA: Insufficient documentation

## 2013-07-05 DIAGNOSIS — R197 Diarrhea, unspecified: Secondary | ICD-10-CM | POA: Insufficient documentation

## 2013-07-05 DIAGNOSIS — R079 Chest pain, unspecified: Secondary | ICD-10-CM | POA: Insufficient documentation

## 2013-07-05 DIAGNOSIS — Z79899 Other long term (current) drug therapy: Secondary | ICD-10-CM | POA: Insufficient documentation

## 2013-07-05 DIAGNOSIS — I252 Old myocardial infarction: Secondary | ICD-10-CM | POA: Insufficient documentation

## 2013-07-05 DIAGNOSIS — R61 Generalized hyperhidrosis: Secondary | ICD-10-CM | POA: Insufficient documentation

## 2013-07-05 DIAGNOSIS — F411 Generalized anxiety disorder: Secondary | ICD-10-CM | POA: Insufficient documentation

## 2013-07-05 DIAGNOSIS — E669 Obesity, unspecified: Secondary | ICD-10-CM | POA: Insufficient documentation

## 2013-07-05 DIAGNOSIS — G8929 Other chronic pain: Secondary | ICD-10-CM | POA: Insufficient documentation

## 2013-07-05 LAB — CBC WITH DIFFERENTIAL/PLATELET
Basophils Relative: 0 % (ref 0–1)
Hemoglobin: 14.5 g/dL (ref 13.0–17.0)
Lymphs Abs: 1.8 10*3/uL (ref 0.7–4.0)
Monocytes Relative: 10 % (ref 3–12)
Neutro Abs: 4.6 10*3/uL (ref 1.7–7.7)
Neutrophils Relative %: 63 % (ref 43–77)
RBC: 4.46 MIL/uL (ref 4.22–5.81)
WBC: 7.3 10*3/uL (ref 4.0–10.5)

## 2013-07-05 LAB — BASIC METABOLIC PANEL
BUN: 8 mg/dL (ref 6–23)
Chloride: 99 mEq/L (ref 96–112)
GFR calc Af Amer: >90 mL/min (ref >90)
Glucose, Bld: 104 mg/dL — ABNORMAL HIGH (ref 70–99)
Potassium: 3.8 mEq/L (ref 3.5–5.1)
Sodium: 135 mEq/L (ref 135–145)

## 2013-07-05 LAB — TROPONIN I: Troponin I: <0.3 ng/mL (ref <0.30)

## 2013-07-05 LAB — POCT I-STAT TROPONIN I

## 2013-07-05 MED ORDER — HYDROCODONE-ACETAMINOPHEN 5-325 MG PO TABS: 2.0000 | ORAL_TABLET | ORAL | Status: DC | PRN

## 2013-07-05 MED ORDER — IOHEXOL 350 MG/ML SOLN
80.0000 mL | Freq: Once | INTRAVENOUS | Status: AC | PRN
  Administered 2013-07-05: 80 mL via INTRAVENOUS

## 2013-07-05 MED ORDER — HYDROMORPHONE HCL PF 1 MG/ML IJ SOLN
1.0000 mg | INTRAMUSCULAR | Status: DC | PRN
  Administered 2013-07-05: 1 mg via INTRAVENOUS
  Filled 2013-07-05: qty 1

## 2013-07-05 MED ORDER — ONDANSETRON HCL 4 MG/2ML IJ SOLN
4.0000 mg | Freq: Once | INTRAMUSCULAR | Status: AC
  Administered 2013-07-05: 4 mg via INTRAVENOUS
  Filled 2013-07-05: qty 2
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
## 2013-07-05 NOTE — ED Notes (Signed)
Pt resting quietly at the time. Vital signs stable. Denies pain. No signs of distress noted.

## 2013-07-05 NOTE — ED Notes (Signed)
0640  Pt arrives to the ER via EMS with pain between the shoulder blades that started around 0200 this morning.  Also C/O abd pain some nausea with headache for the past few weeks on and off.  SOB also for the past few months.  States he thought his heart just felt funny

## 2013-09-08 IMAGING — US US ABDOMEN COMPLETE
1 series · 13 of 25 positions shown · non-contrast
Comparison: None

CLINICAL DATA: Chest and epigastric pain, elevated malaise and
right base, history coronary artery disease post MI, ischemic
cardiomyopathy, AICD, hypertension, gout

ULTRASOUND ABDOMEN:
TECHNIQUE: Sonography of upper abdominal structures was performed.
Degradation of image quality secondary to body habitus.

[Series 1: us abdomen complete · 0.33mm/px · 13 of 59 slices shown]
[im 1/59]
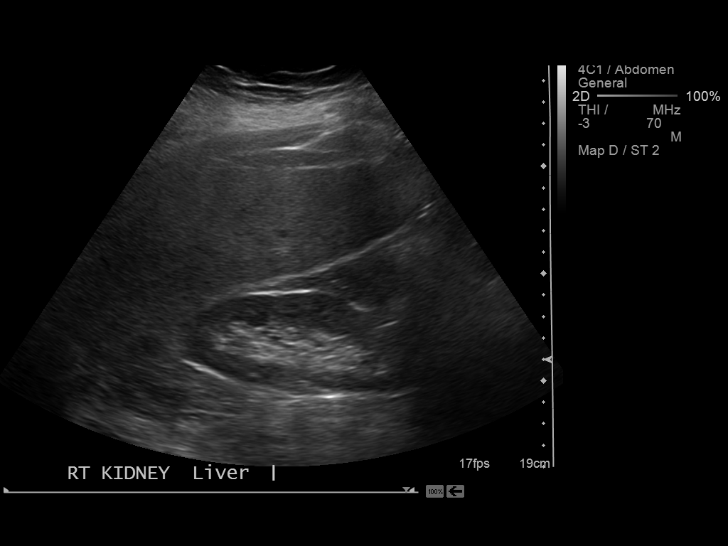
[im 5/59]
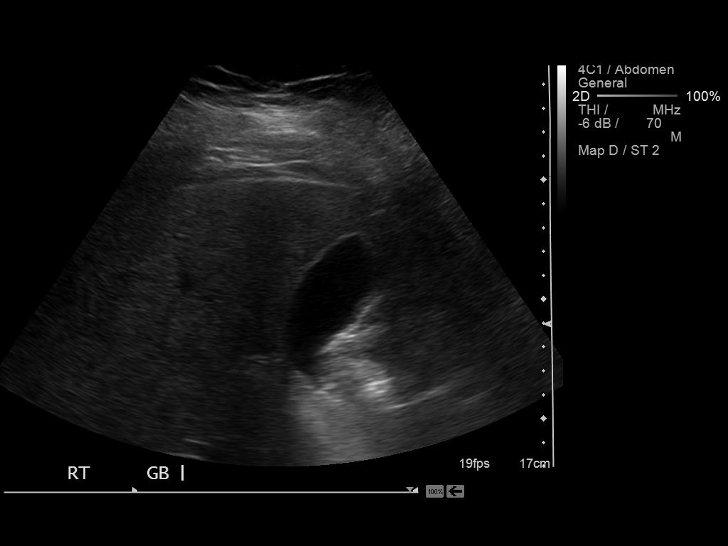
[im 10/59]
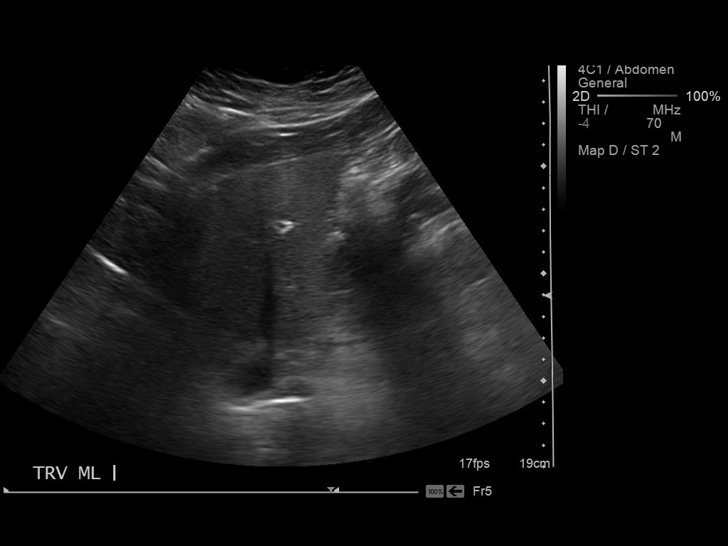
[im 15/59]
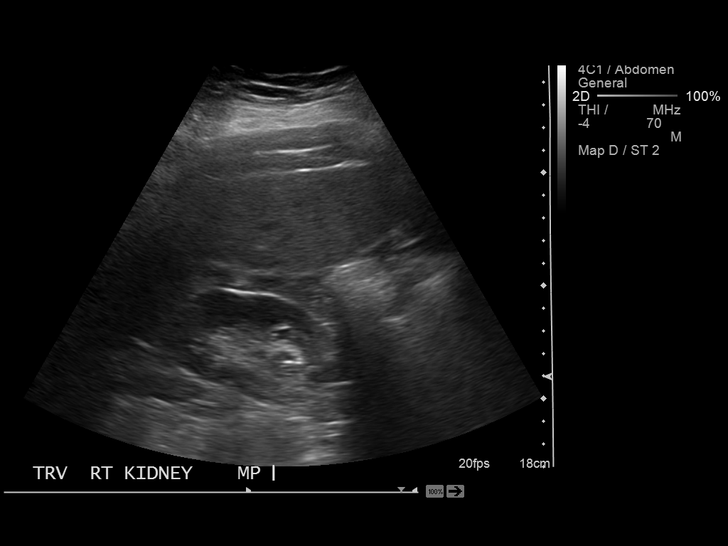
[im 20/59]
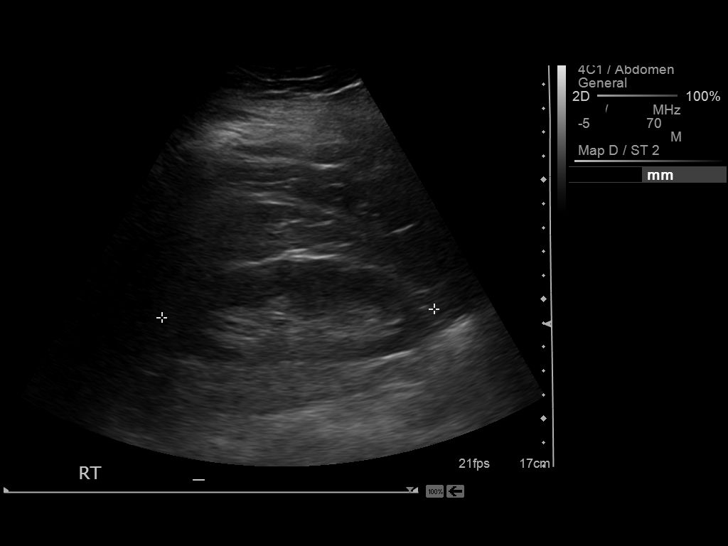
[im 25/59]
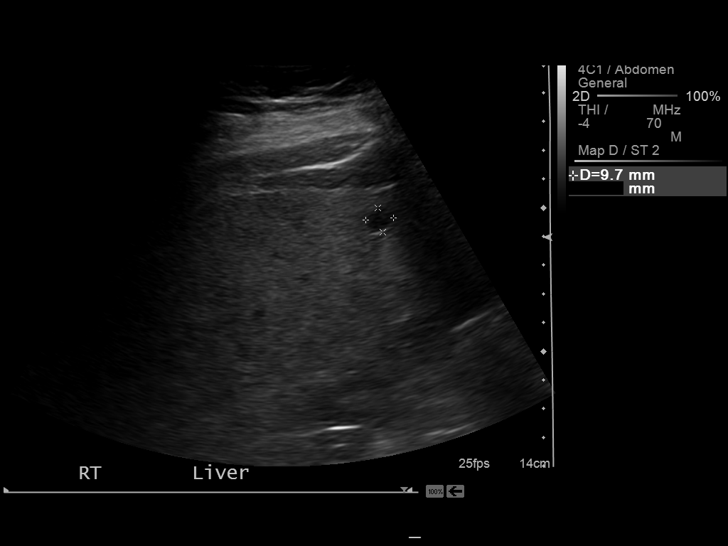
[im 30/59]
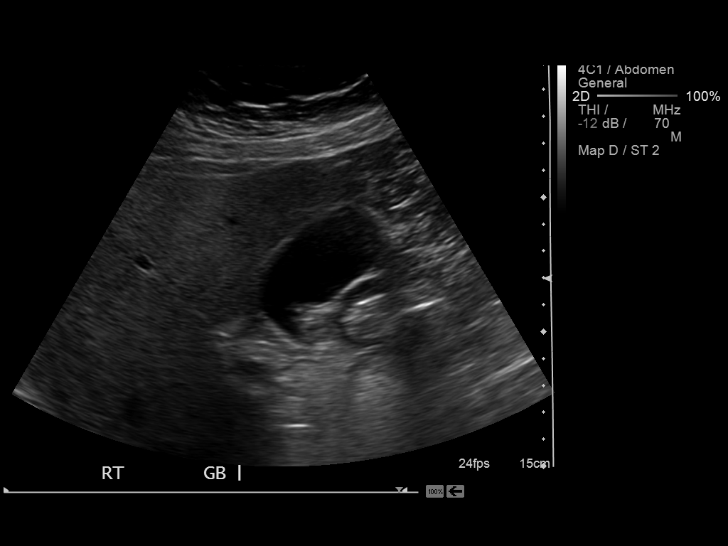
[im 34/59]
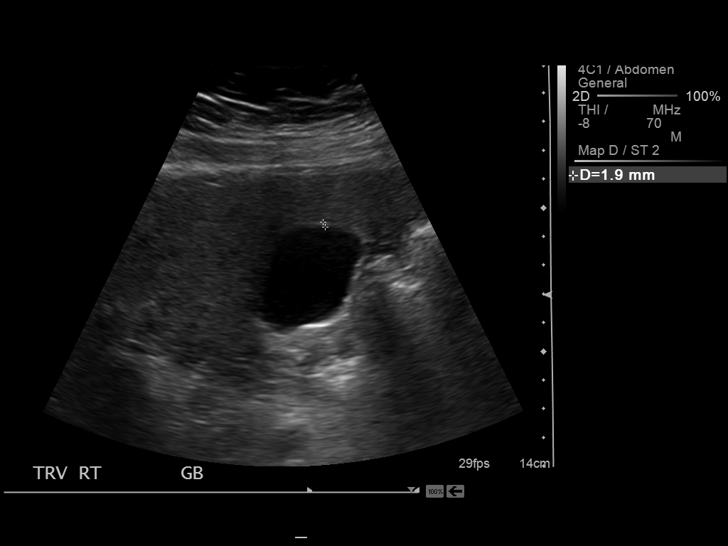
[im 39/59]
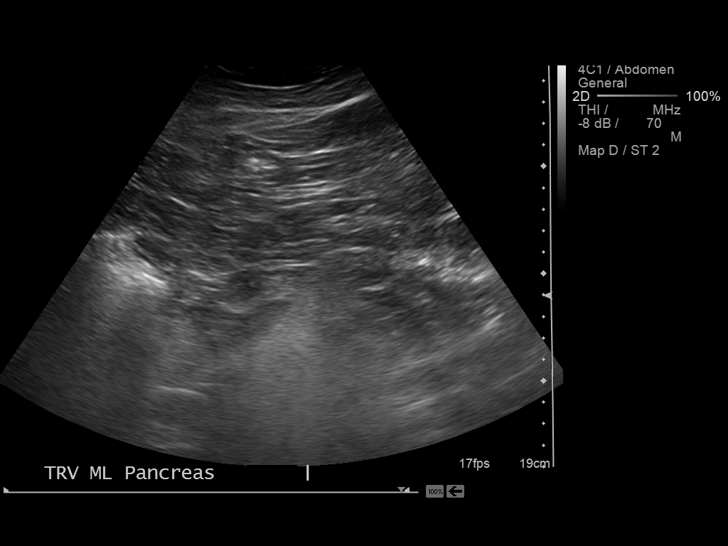
[im 44/59]
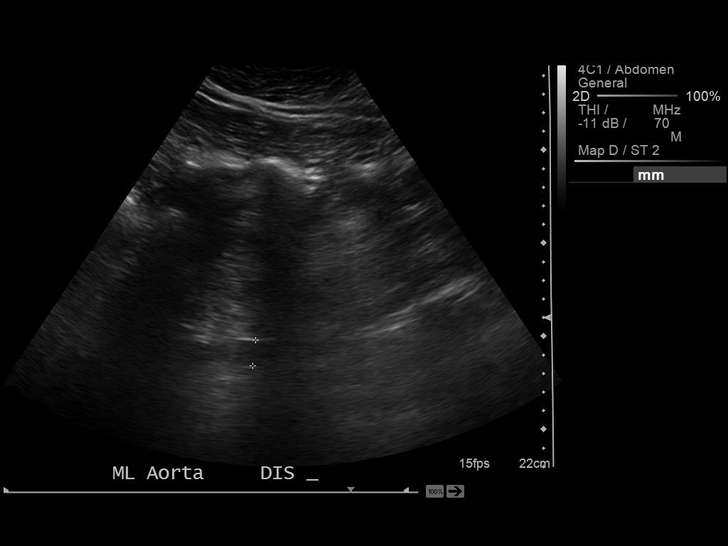
[im 49/59]
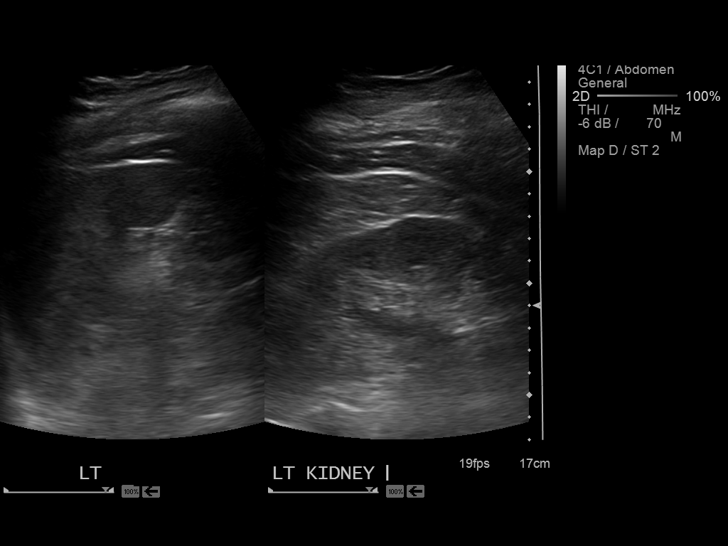
[im 54/59]
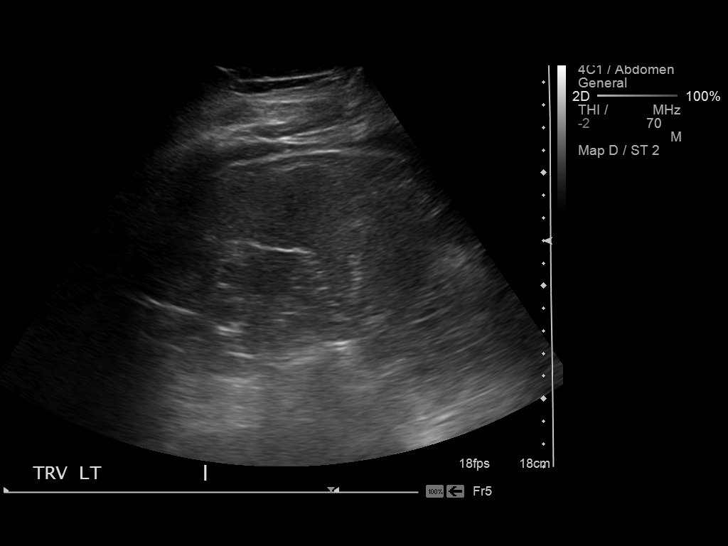
[im 59/59]
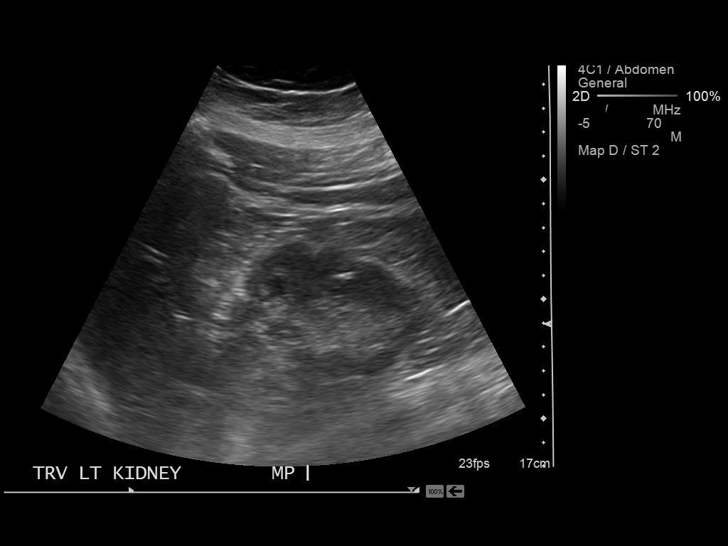

[13 of 25 positions shown; findings below may reference images not displayed]

Gallbladder:  Normally distended without stones or wall thickening.
No pericholecystic fluid or sonographic Murphy sign.

Common bile duct:  Normal caliber 3 mm diameter

Liver:  Echogenic, question fatty infiltration, though this can be
seen with cirrhosis and certain infiltrative disorders.  Poor sound
transmission through echogenic parenchyma with suboptimal
visualization of intrahepatic anatomy/detail.  Small cyst noted
within right lobe 10 x 9 x 8 mm.  No other definite hepatic
abnormalities identified, though not excluded.  Questionable
nodular contours of hepatic parenchyma, note images 29 - 30
anteriorly.

IVC:  Normal appearance

Pancreas:  Obscured by bowel gas

Spleen:  Normal appearance, 8.2 cm length

Right kidney:  11.4 cm length. Normal morphology without mass or
hydronephrosis.

Left kidney:  12.9 cm length. Normal morphology without mass or
hydronephrosis.  Small nonshadowing linear echogenic focus at mid
left kidney, cannot exclude small nonshadowing nonobstructing
calculus.

Aorta:  Predominately obscured due to bowel gas

Other:  No free fluid
IMPRESSION: Nonvisualization of pancreas and aorta due to bowel gas.
Echogenic liver which could reflect fatty infiltration or
cirrhosis.
No evidence of cholelithiasis or biliary dilatation.
Consider CT abdomen with IV and oral contrast for better pancreatic
assessment.

## 2014-01-21 IMAGING — US US SCROTUM
1 series · 4 of 4 positions shown · non-contrast
Comparison: 11/10/2012

CLINICAL DATA: Right scrotal swelling and pain.  Right inguinal
pain.  Prior hernia repair.

SCROTAL ULTRASOUND
DOPPLER ULTRASOUND OF THE TESTICLES
TECHNIQUE: Complete ultrasound examination of the testicles,
epididymis, and other scrotal structures was performed.  Color and
spectral Doppler ultrasound were also utilized to evaluate blood
flow to the testicles.

[Series 1: us scrotum · 0.09mm/px · 4 of 4 slices shown]
[im 1/4]
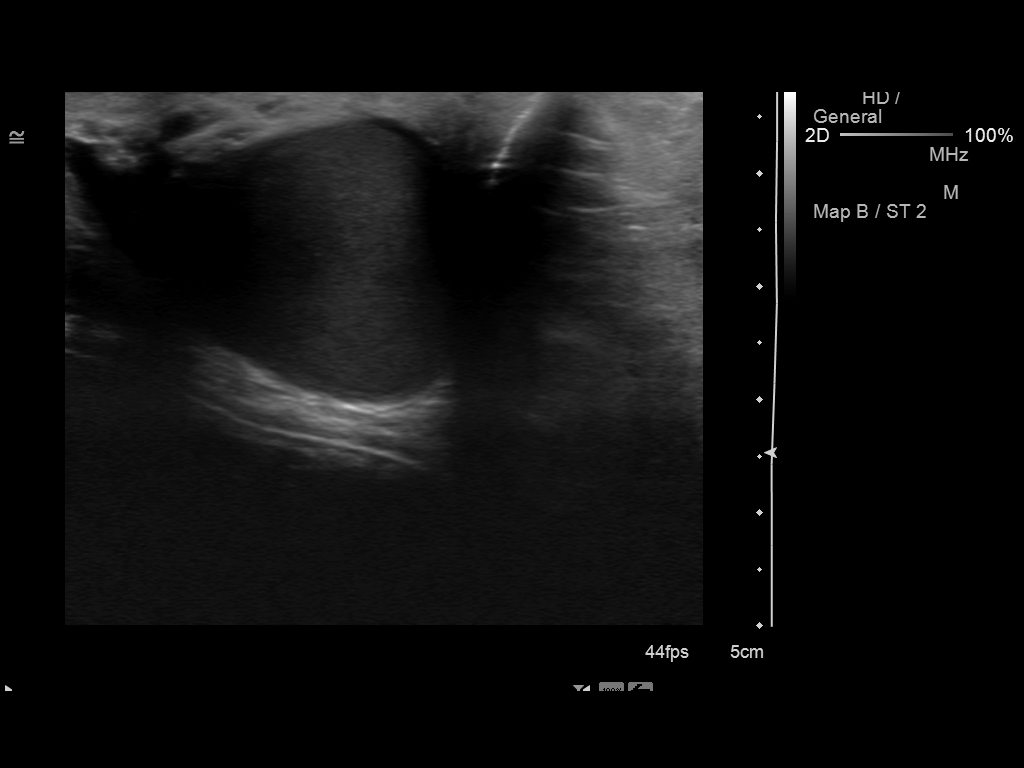
[im 2/4]
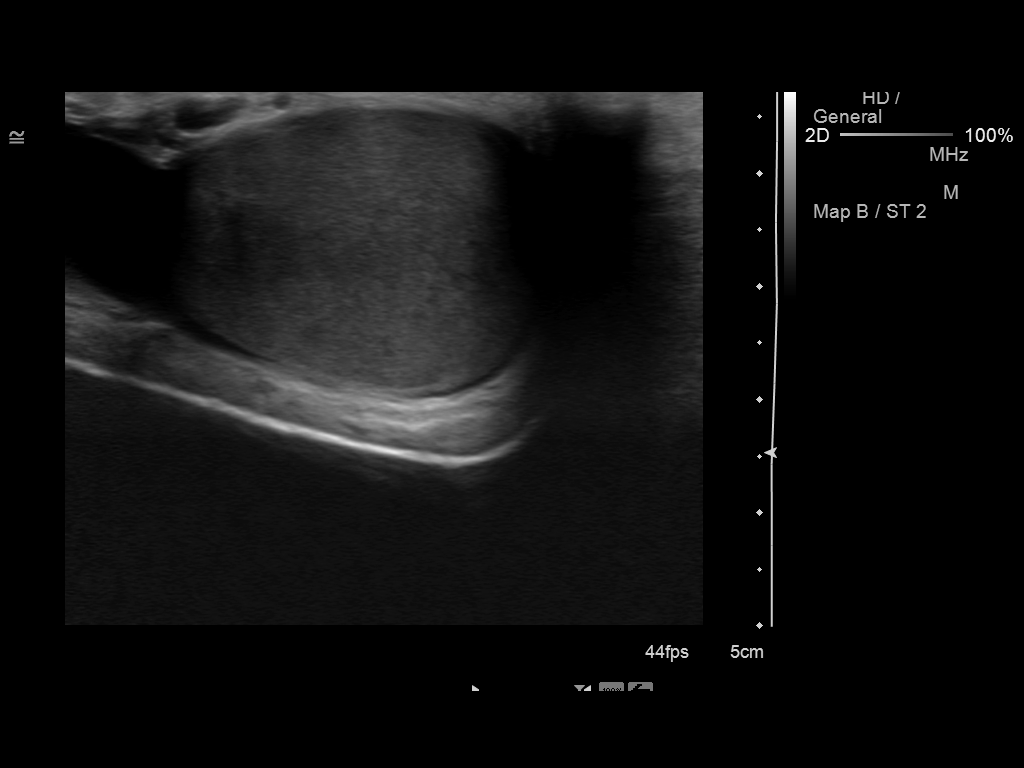
[im 3/4]
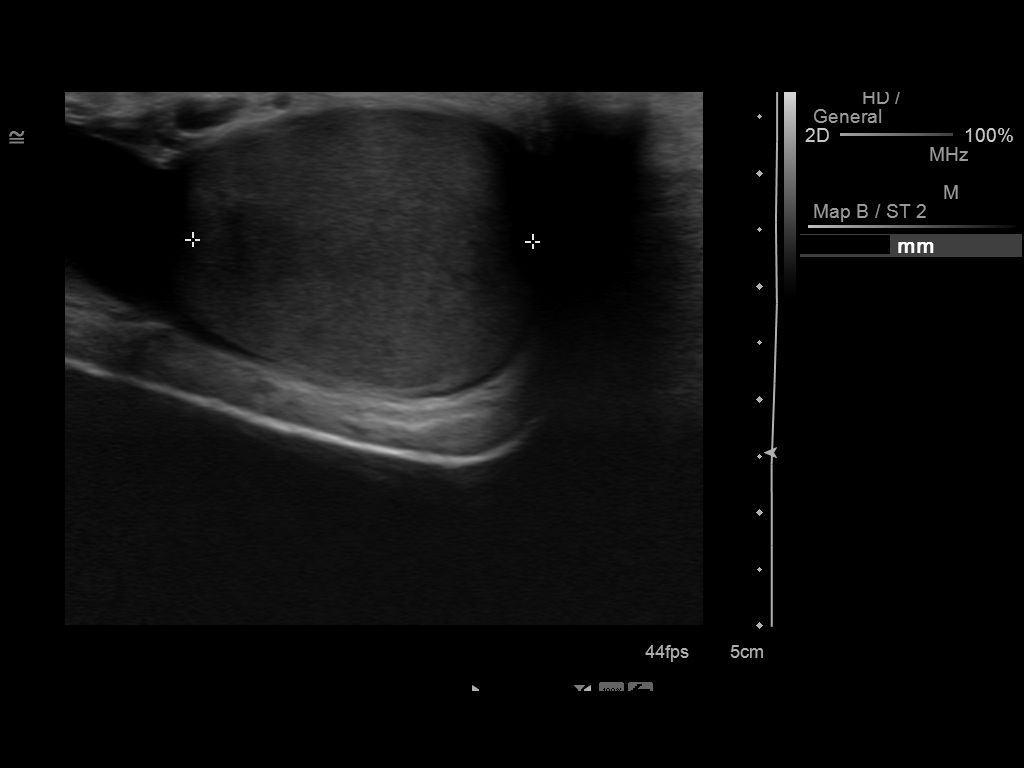
[im 4/4]
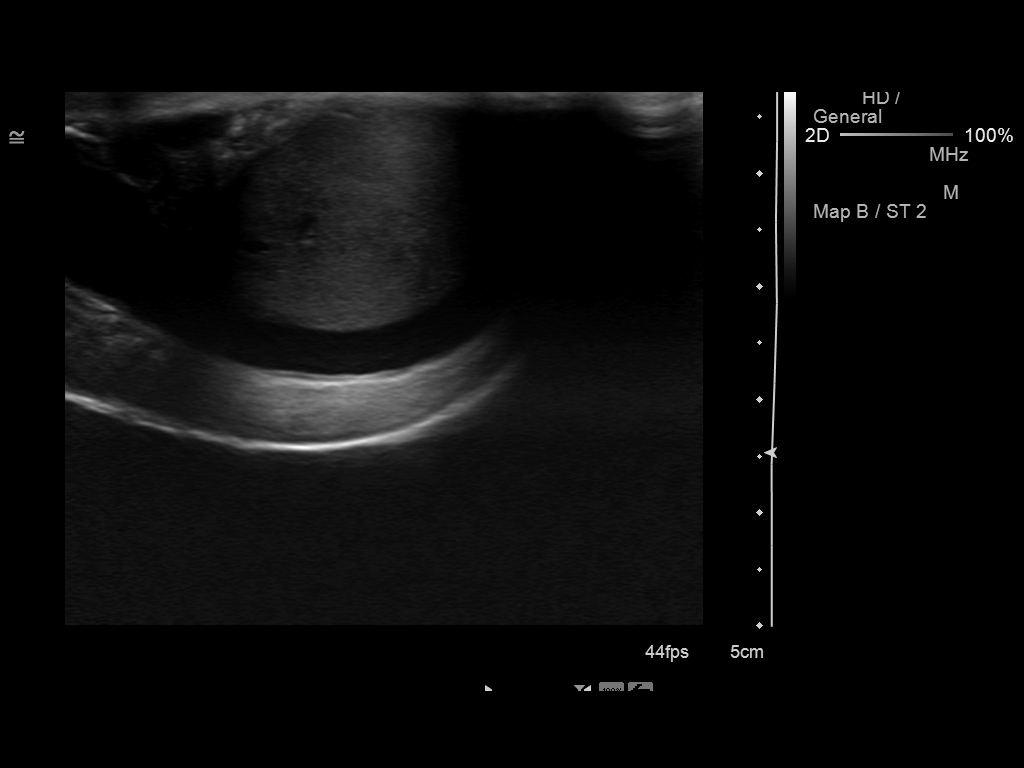

[4 of 4 positions shown; findings below may reference images not displayed]

FINDINGS: Right testis:  Measures 5.1 x 2.5 x 3.5 cm, with normal
echogenicity and echotexture.

Left testis:  Measures 5.1 x 2.5 x 3.0 cm, with normal echogenicity
and echotexture.

Right epididymis:  Normal in size and appearance.

Left epididymis:  Normal in size and appearance.

Hydrocele:  Large right hydrocele.  Trace left hydrocele.

Varicocele:  Absent

Pulsed Doppler interrogation of both testes demonstrates low
resistance flow bilaterally. No findings of torsion.  Vascularity
of the testicles appears symmetric.  No discrete sonographic
abnormality is observed in the right inguinal region in the area of
the patient's pain.
IMPRESSION: 1.  Large right and trace left hydroceles.  Normal sonographic
appearance of the testicles and epididymides.

## 2014-02-06 IMAGING — CR DG CHEST 2V
2 series · 2 of 2 positions shown · non-contrast
Comparison: Chest radiograph performed 11/04/2012

CLINICAL DATA: Cough and shortness of breath.

CHEST - 2 VIEW

[w chest pa]
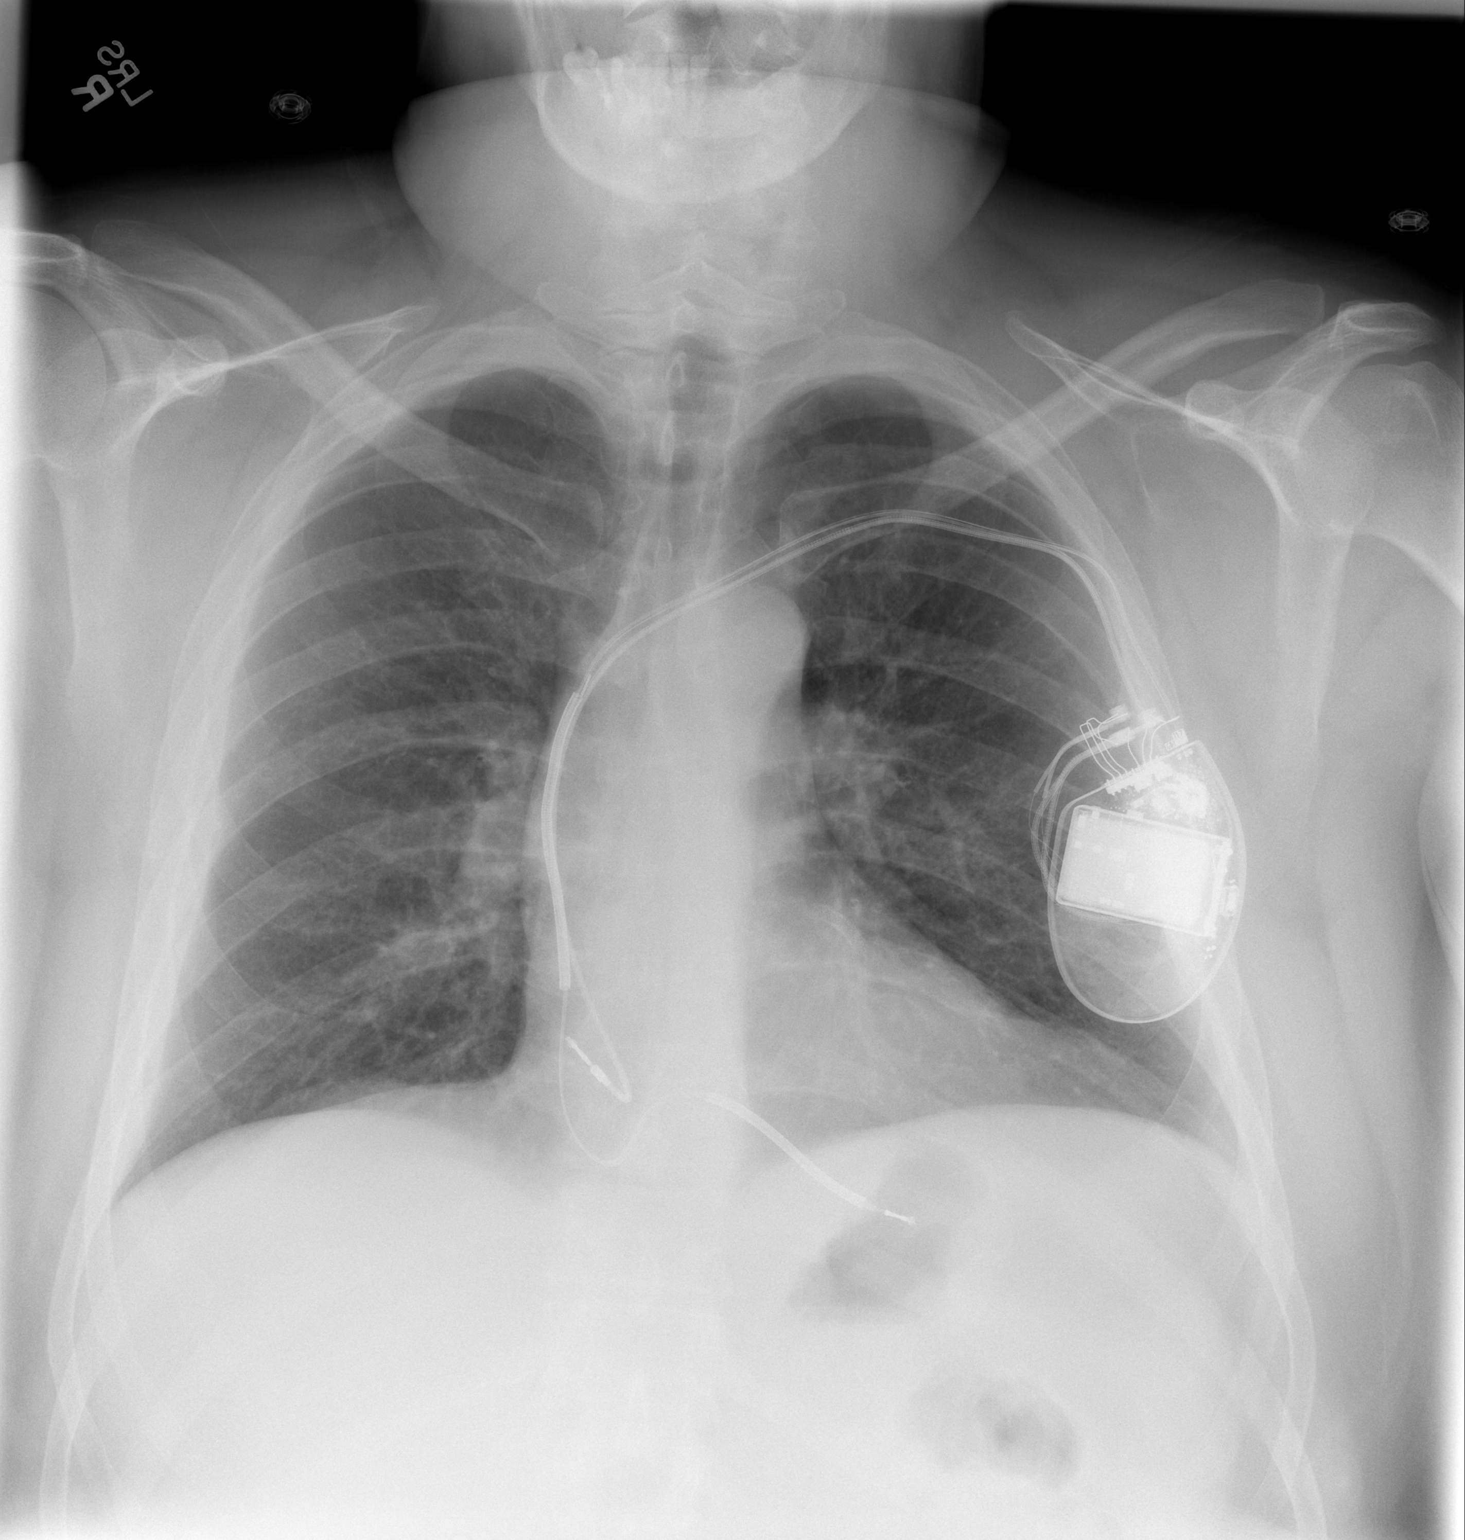

[w chest lat]
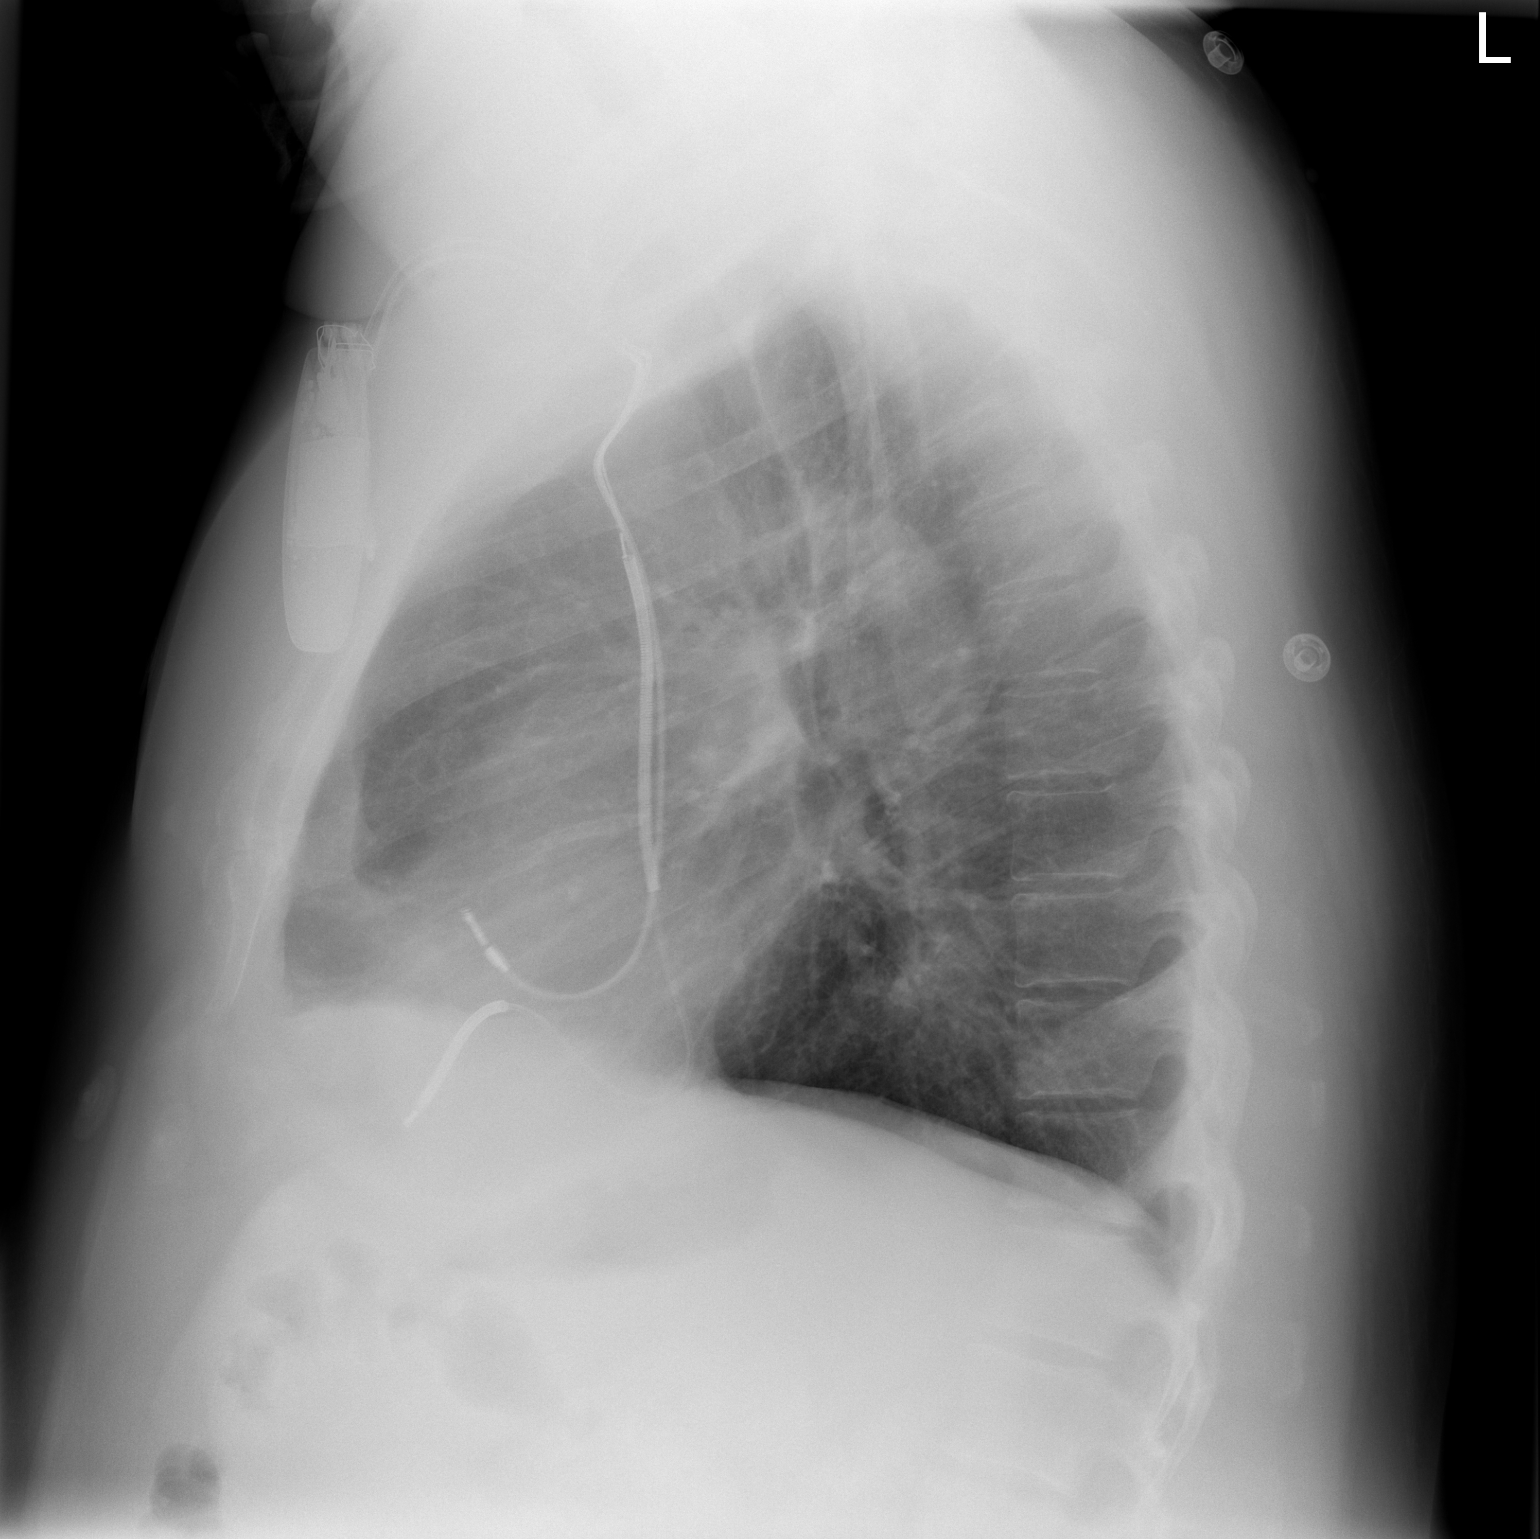

[2 of 2 positions shown; findings below may reference images not displayed]

FINDINGS: The lungs are well-aerated.  Mild chronic peribronchial
thickening is noted.  There is no evidence of focal opacification,
pleural effusion or pneumothorax.

The heart is normal in size; a pacemaker/AICD is noted at the left
chest wall, with leads ending at the right atrium and right
ventricle.  No acute osseous abnormalities are seen.
IMPRESSION: Mild chronic peribronchial thickening noted; lungs otherwise clear.

## 2014-10-02 ENCOUNTER — Encounter (HOSPITAL_COMMUNITY): Payer: Self-pay | Admitting: *Deleted

## 2014-10-02 ENCOUNTER — Emergency Department (HOSPITAL_COMMUNITY)
Admission: EM | Admit: 2014-10-02 | Discharge: 2014-10-02 | Disposition: A | Discharge status: Home / Self Care | Payer: BC Managed Care – HMO | Attending: Emergency Medicine | Admitting: Emergency Medicine

## 2014-10-02 DIAGNOSIS — M109 Gout, unspecified: Secondary | ICD-10-CM | POA: Insufficient documentation

## 2014-10-02 DIAGNOSIS — Z8659 Personal history of other mental and behavioral disorders: Secondary | ICD-10-CM | POA: Diagnosis not present

## 2014-10-02 DIAGNOSIS — Z8719 Personal history of other diseases of the digestive system: Secondary | ICD-10-CM | POA: Insufficient documentation

## 2014-10-02 DIAGNOSIS — I10 Essential (primary) hypertension: Secondary | ICD-10-CM | POA: Diagnosis not present

## 2014-10-02 DIAGNOSIS — Z95810 Presence of automatic (implantable) cardiac defibrillator: Secondary | ICD-10-CM | POA: Insufficient documentation

## 2014-10-02 DIAGNOSIS — I2510 Atherosclerotic heart disease of native coronary artery without angina pectoris: Secondary | ICD-10-CM | POA: Diagnosis not present

## 2014-10-02 DIAGNOSIS — G8929 Other chronic pain: Secondary | ICD-10-CM | POA: Diagnosis not present

## 2014-10-02 DIAGNOSIS — M79672 Pain in left foot: Secondary | ICD-10-CM | POA: Diagnosis present

## 2014-10-02 DIAGNOSIS — Z79899 Other long term (current) drug therapy: Secondary | ICD-10-CM | POA: Diagnosis not present

## 2014-10-02 DIAGNOSIS — Z7901 Long term (current) use of anticoagulants: Secondary | ICD-10-CM | POA: Insufficient documentation

## 2014-10-02 DIAGNOSIS — Z8781 Personal history of (healed) traumatic fracture: Secondary | ICD-10-CM | POA: Insufficient documentation

## 2014-10-02 DIAGNOSIS — E785 Hyperlipidemia, unspecified: Secondary | ICD-10-CM | POA: Insufficient documentation

## 2014-10-02 DIAGNOSIS — I252 Old myocardial infarction: Secondary | ICD-10-CM | POA: Insufficient documentation

## 2014-10-02 MED ORDER — OXYCODONE-ACETAMINOPHEN 5-325 MG PO TABS
2.0000 | ORAL_TABLET | Freq: Once | ORAL | Status: AC
  Administered 2014-10-02: 2 via ORAL
  Filled 2014-10-02: qty 2

## 2014-10-02 MED ORDER — INDOMETHACIN 25 MG PO CAPS: 25.0000 mg | ORAL_CAPSULE | Freq: Three times a day (TID) | ORAL | Status: DC | PRN
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
## 2014-10-02 NOTE — ED Notes (Signed)
Pt refused cardiac monitoring. Pt informed staff "I have the cadillac of pacemakers, if it goes off I'll tell you." RN informed patient of need for monitoring. Pt continues to refuse. MD made aware.

## 2014-10-02 NOTE — ED Notes (Signed)
The pt is c/o lt foot pain since yesterday.  Swollen lt foot.  Hx of gout

## 2014-10-02 NOTE — Discharge Instructions (Signed)

## 2014-10-02 NOTE — ED Notes (Signed)
Patient refuse to put on heart monitor. Nurse was informed.

## 2014-10-04 ENCOUNTER — Encounter (HOSPITAL_COMMUNITY): Payer: Self-pay | Admitting: *Deleted

## 2014-10-04 ENCOUNTER — Emergency Department (HOSPITAL_COMMUNITY)
Admission: EM | Admit: 2014-10-04 | Discharge: 2014-10-04 | Disposition: A | Discharge status: Home / Self Care | Payer: BC Managed Care – HMO | Attending: Emergency Medicine | Admitting: Emergency Medicine

## 2014-10-04 DIAGNOSIS — Z8781 Personal history of (healed) traumatic fracture: Secondary | ICD-10-CM | POA: Diagnosis not present

## 2014-10-04 DIAGNOSIS — E785 Hyperlipidemia, unspecified: Secondary | ICD-10-CM | POA: Insufficient documentation

## 2014-10-04 DIAGNOSIS — Z79899 Other long term (current) drug therapy: Secondary | ICD-10-CM | POA: Diagnosis not present

## 2014-10-04 DIAGNOSIS — Z8719 Personal history of other diseases of the digestive system: Secondary | ICD-10-CM | POA: Diagnosis not present

## 2014-10-04 DIAGNOSIS — Z9889 Other specified postprocedural states: Secondary | ICD-10-CM | POA: Insufficient documentation

## 2014-10-04 DIAGNOSIS — I252 Old myocardial infarction: Secondary | ICD-10-CM | POA: Insufficient documentation

## 2014-10-04 DIAGNOSIS — Z8659 Personal history of other mental and behavioral disorders: Secondary | ICD-10-CM | POA: Diagnosis not present

## 2014-10-04 DIAGNOSIS — G8929 Other chronic pain: Secondary | ICD-10-CM | POA: Insufficient documentation

## 2014-10-04 DIAGNOSIS — M25572 Pain in left ankle and joints of left foot: Secondary | ICD-10-CM | POA: Diagnosis present

## 2014-10-04 DIAGNOSIS — Z95810 Presence of automatic (implantable) cardiac defibrillator: Secondary | ICD-10-CM | POA: Insufficient documentation

## 2014-10-04 DIAGNOSIS — M10072 Idiopathic gout, left ankle and foot: Secondary | ICD-10-CM | POA: Insufficient documentation

## 2014-10-04 DIAGNOSIS — I10 Essential (primary) hypertension: Secondary | ICD-10-CM | POA: Insufficient documentation

## 2014-10-04 DIAGNOSIS — M109 Gout, unspecified: Secondary | ICD-10-CM

## 2014-10-04 DIAGNOSIS — I2510 Atherosclerotic heart disease of native coronary artery without angina pectoris: Secondary | ICD-10-CM | POA: Diagnosis not present

## 2014-10-04 LAB — I-STAT CHEM 8, ED
BUN: 7 mg/dL (ref 6–23)
CALCIUM ION: 1.18 mmol/L (ref 1.12–1.23)
CHLORIDE: 103 meq/L (ref 96–112)
CREATININE: 1.2 mg/dL (ref 0.50–1.35)
GLUCOSE: 107 mg/dL — AB (ref 70–99)
HCT: 40 % (ref 39.0–52.0)
HEMOGLOBIN: 13.6 g/dL (ref 13.0–17.0)
POTASSIUM: 4.1 meq/L (ref 3.7–5.3)
Sodium: 139 mEq/L (ref 137–147)
TCO2: 25 mmol/L (ref 0–100)

## 2014-10-04 MED ORDER — KETOROLAC TROMETHAMINE 30 MG/ML IJ SOLN
30.0000 mg | Freq: Once | INTRAMUSCULAR | Status: AC
  Administered 2014-10-04: 30 mg via INTRAMUSCULAR
  Filled 2014-10-04: qty 1

## 2014-10-04 MED ORDER — OXYCODONE-ACETAMINOPHEN 5-325 MG PO TABS
1.0000 | ORAL_TABLET | Freq: Once | ORAL | Status: AC
  Administered 2014-10-04: 1 via ORAL
  Filled 2014-10-04: qty 1

## 2014-10-04 MED ORDER — OXYCODONE-ACETAMINOPHEN 5-325 MG PO TABS: 1.0000 | ORAL_TABLET | Freq: Four times a day (QID) | ORAL | Status: DC | PRN

## 2014-10-04 NOTE — ED Notes (Signed)
PA at bedside.

## 2014-10-04 NOTE — Discharge Instructions (Signed)

## 2014-10-04 NOTE — ED Notes (Signed)
Pt is here with left ankle, foot and knee pain and feels pain is related to his gout.

## 2014-10-04 NOTE — ED Notes (Signed)
Pulse palpable
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
## 2014-10-15 ENCOUNTER — Encounter (HOSPITAL_COMMUNITY): Payer: Self-pay | Admitting: Cardiovascular Disease

## 2015-01-07 ENCOUNTER — Emergency Department (HOSPITAL_BASED_OUTPATIENT_CLINIC_OR_DEPARTMENT_OTHER)
Admission: EM | Admit: 2015-01-07 | Discharge: 2015-01-07 | Disposition: A | Discharge status: Home / Self Care | Payer: BLUE CROSS/BLUE SHIELD | Attending: Emergency Medicine | Admitting: Emergency Medicine

## 2015-01-07 ENCOUNTER — Encounter (HOSPITAL_BASED_OUTPATIENT_CLINIC_OR_DEPARTMENT_OTHER): Payer: Self-pay | Admitting: Emergency Medicine

## 2015-01-07 DIAGNOSIS — I2510 Atherosclerotic heart disease of native coronary artery without angina pectoris: Secondary | ICD-10-CM | POA: Diagnosis not present

## 2015-01-07 DIAGNOSIS — M79672 Pain in left foot: Secondary | ICD-10-CM | POA: Diagnosis present

## 2015-01-07 DIAGNOSIS — F419 Anxiety disorder, unspecified: Secondary | ICD-10-CM | POA: Insufficient documentation

## 2015-01-07 DIAGNOSIS — Z9889 Other specified postprocedural states: Secondary | ICD-10-CM | POA: Insufficient documentation

## 2015-01-07 DIAGNOSIS — Z95810 Presence of automatic (implantable) cardiac defibrillator: Secondary | ICD-10-CM | POA: Insufficient documentation

## 2015-01-07 DIAGNOSIS — G8929 Other chronic pain: Secondary | ICD-10-CM | POA: Insufficient documentation

## 2015-01-07 DIAGNOSIS — Z8781 Personal history of (healed) traumatic fracture: Secondary | ICD-10-CM | POA: Insufficient documentation

## 2015-01-07 DIAGNOSIS — Z8719 Personal history of other diseases of the digestive system: Secondary | ICD-10-CM | POA: Diagnosis not present

## 2015-01-07 DIAGNOSIS — Z87828 Personal history of other (healed) physical injury and trauma: Secondary | ICD-10-CM | POA: Insufficient documentation

## 2015-01-07 DIAGNOSIS — I10 Essential (primary) hypertension: Secondary | ICD-10-CM | POA: Diagnosis not present

## 2015-01-07 DIAGNOSIS — Z7901 Long term (current) use of anticoagulants: Secondary | ICD-10-CM | POA: Diagnosis not present

## 2015-01-07 DIAGNOSIS — M109 Gout, unspecified: Secondary | ICD-10-CM | POA: Diagnosis not present

## 2015-01-07 DIAGNOSIS — I252 Old myocardial infarction: Secondary | ICD-10-CM | POA: Diagnosis not present

## 2015-01-07 DIAGNOSIS — Z79899 Other long term (current) drug therapy: Secondary | ICD-10-CM | POA: Insufficient documentation

## 2015-01-07 LAB — BASIC METABOLIC PANEL
ANION GAP: 3 — AB (ref 5–15)
BUN: 8 mg/dL (ref 6–23)
CHLORIDE: 103 mmol/L (ref 96–112)
CO2: 29 mmol/L (ref 19–32)
CREATININE: 1.05 mg/dL (ref 0.50–1.35)
Calcium: 8.3 mg/dL — ABNORMAL LOW (ref 8.4–10.5)
GFR calc Af Amer: >90 mL/min (ref >90)
GFR calc non Af Amer: 83 mL/min — ABNORMAL LOW (ref >90)
GLUCOSE: 91 mg/dL (ref 70–99)
Potassium: 3.9 mmol/L (ref 3.5–5.1)
Sodium: 135 mmol/L (ref 135–145)

## 2015-01-07 LAB — CBC WITH DIFFERENTIAL/PLATELET
BASOS ABS: 0 10*3/uL (ref 0.0–0.1)
Basophils Relative: 1 % (ref 0–1)
EOS PCT: 2 % (ref 0–5)
Eosinophils Absolute: 0.2 10*3/uL (ref 0.0–0.7)
HCT: 41.8 % (ref 39.0–52.0)
Hemoglobin: 13.4 g/dL (ref 13.0–17.0)
LYMPHS ABS: 2.2 10*3/uL (ref 0.7–4.0)
LYMPHS PCT: 28 % (ref 12–46)
MCH: 27.7 pg (ref 26.0–34.0)
MCHC: 32.1 g/dL (ref 30.0–36.0)
MCV: 86.4 fL (ref 78.0–100.0)
Monocytes Absolute: 1 10*3/uL (ref 0.1–1.0)
Monocytes Relative: 13 % — ABNORMAL HIGH (ref 3–12)
Neutro Abs: 4.4 10*3/uL (ref 1.7–7.7)
Neutrophils Relative %: 56 % (ref 43–77)
PLATELETS: 256 10*3/uL (ref 150–400)
RBC: 4.84 MIL/uL (ref 4.22–5.81)
RDW: 14.2 % (ref 11.5–15.5)
WBC: 7.8 10*3/uL (ref 4.0–10.5)

## 2015-01-07 LAB — URIC ACID: URIC ACID, SERUM: 8.1 mg/dL — AB (ref 4.0–7.8)

## 2015-01-07 MED ORDER — INDOMETHACIN 25 MG PO CAPS: 25.0000 mg | ORAL_CAPSULE | Freq: Three times a day (TID) | ORAL | Status: DC | PRN

## 2015-01-07 MED ORDER — HYDROMORPHONE HCL 1 MG/ML IJ SOLN
1.0000 mg | Freq: Once | INTRAMUSCULAR | Status: AC
  Administered 2015-01-07: 1 mg via INTRAVENOUS
  Filled 2015-01-07: qty 1

## 2015-01-07 MED ORDER — KETOROLAC TROMETHAMINE 30 MG/ML IJ SOLN
30.0000 mg | Freq: Once | INTRAMUSCULAR | Status: AC
  Administered 2015-01-07: 30 mg via INTRAVENOUS
  Filled 2015-01-07: qty 1

## 2015-01-07 MED ORDER — OXYCODONE-ACETAMINOPHEN 5-325 MG PO TABS: 1.0000 | ORAL_TABLET | ORAL | Status: DC | PRN
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
## 2015-01-07 NOTE — ED Notes (Signed)
Pt complains of severe left foot pain secondary to gout.  The pain is radiating towards the knee.  Pain is rated at a 10 on a 0-10 scale and is described as sharp, burning, throbbing pain.

## 2015-01-07 NOTE — Discharge Instructions (Signed)

## 2015-03-10 ENCOUNTER — Emergency Department (HOSPITAL_COMMUNITY)
Admission: EM | Admit: 2015-03-10 | Discharge: 2015-03-10 | Disposition: A | Discharge status: Home / Self Care | Payer: BLUE CROSS/BLUE SHIELD | Attending: Emergency Medicine | Admitting: Emergency Medicine

## 2015-03-10 ENCOUNTER — Encounter (HOSPITAL_COMMUNITY): Payer: Self-pay | Admitting: *Deleted

## 2015-03-10 DIAGNOSIS — Z950 Presence of cardiac pacemaker: Secondary | ICD-10-CM | POA: Diagnosis not present

## 2015-03-10 DIAGNOSIS — M5442 Lumbago with sciatica, left side: Secondary | ICD-10-CM | POA: Insufficient documentation

## 2015-03-10 DIAGNOSIS — M545 Low back pain, unspecified: Secondary | ICD-10-CM

## 2015-03-10 DIAGNOSIS — M109 Gout, unspecified: Secondary | ICD-10-CM | POA: Diagnosis not present

## 2015-03-10 DIAGNOSIS — Z8781 Personal history of (healed) traumatic fracture: Secondary | ICD-10-CM | POA: Insufficient documentation

## 2015-03-10 DIAGNOSIS — Z8719 Personal history of other diseases of the digestive system: Secondary | ICD-10-CM | POA: Diagnosis not present

## 2015-03-10 DIAGNOSIS — Z7901 Long term (current) use of anticoagulants: Secondary | ICD-10-CM | POA: Diagnosis not present

## 2015-03-10 DIAGNOSIS — Z79899 Other long term (current) drug therapy: Secondary | ICD-10-CM | POA: Diagnosis not present

## 2015-03-10 DIAGNOSIS — Z791 Long term (current) use of non-steroidal anti-inflammatories (NSAID): Secondary | ICD-10-CM | POA: Diagnosis not present

## 2015-03-10 DIAGNOSIS — I252 Old myocardial infarction: Secondary | ICD-10-CM | POA: Diagnosis not present

## 2015-03-10 DIAGNOSIS — G8929 Other chronic pain: Secondary | ICD-10-CM | POA: Diagnosis not present

## 2015-03-10 DIAGNOSIS — E785 Hyperlipidemia, unspecified: Secondary | ICD-10-CM | POA: Diagnosis not present

## 2015-03-10 DIAGNOSIS — I2510 Atherosclerotic heart disease of native coronary artery without angina pectoris: Secondary | ICD-10-CM | POA: Insufficient documentation

## 2015-03-10 DIAGNOSIS — I10 Essential (primary) hypertension: Secondary | ICD-10-CM | POA: Diagnosis not present

## 2015-03-10 DIAGNOSIS — M5432 Sciatica, left side: Secondary | ICD-10-CM

## 2015-03-10 DIAGNOSIS — Z9889 Other specified postprocedural states: Secondary | ICD-10-CM | POA: Diagnosis not present
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
## 2015-03-10 NOTE — ED Notes (Signed)
Pt reports having lower back pain x 5 years but more severe over past few days. Lower back and radiates down left leg.

## 2015-03-10 NOTE — Discharge Instructions (Signed)
[facility] [place] [address] [place] [address] [place], [place] [phone]  [facility] [address] [place], [place] [address] [phone]  [facility] [address], [address] [place] [address] [phone]  [facility] [place] [address] [phone]

## 2018-02-26 ENCOUNTER — Emergency Department (HOSPITAL_COMMUNITY): Payer: Medicaid Other

## 2018-02-26 ENCOUNTER — Encounter (HOSPITAL_COMMUNITY): Payer: Self-pay

## 2018-02-26 ENCOUNTER — Emergency Department (HOSPITAL_COMMUNITY)
Admission: EM | Admit: 2018-02-26 | Discharge: 2018-02-26 | Disposition: A | Discharge status: Home / Self Care | Payer: Medicaid Other | Attending: Emergency Medicine | Admitting: Emergency Medicine

## 2018-02-26 DIAGNOSIS — I252 Old myocardial infarction: Secondary | ICD-10-CM | POA: Diagnosis not present

## 2018-02-26 DIAGNOSIS — Y999 Unspecified external cause status: Secondary | ICD-10-CM | POA: Diagnosis not present

## 2018-02-26 DIAGNOSIS — Z79899 Other long term (current) drug therapy: Secondary | ICD-10-CM | POA: Insufficient documentation

## 2018-02-26 DIAGNOSIS — Y929 Unspecified place or not applicable: Secondary | ICD-10-CM | POA: Diagnosis not present

## 2018-02-26 DIAGNOSIS — I10 Essential (primary) hypertension: Secondary | ICD-10-CM | POA: Insufficient documentation

## 2018-02-26 DIAGNOSIS — M542 Cervicalgia: Secondary | ICD-10-CM | POA: Insufficient documentation

## 2018-02-26 DIAGNOSIS — R739 Hyperglycemia, unspecified: Secondary | ICD-10-CM

## 2018-02-26 DIAGNOSIS — Y939 Activity, unspecified: Secondary | ICD-10-CM | POA: Diagnosis not present

## 2018-02-26 DIAGNOSIS — W1830XA Fall on same level, unspecified, initial encounter: Secondary | ICD-10-CM | POA: Diagnosis not present

## 2018-02-26 DIAGNOSIS — I2510 Atherosclerotic heart disease of native coronary artery without angina pectoris: Secondary | ICD-10-CM | POA: Insufficient documentation

## 2018-02-26 DIAGNOSIS — Z95810 Presence of automatic (implantable) cardiac defibrillator: Secondary | ICD-10-CM | POA: Insufficient documentation

## 2018-02-26 LAB — CBC WITH DIFFERENTIAL/PLATELET
Basophils Absolute: 0 10*3/uL (ref 0.0–0.1)
Basophils Relative: 1 %
EOS ABS: 0.2 10*3/uL (ref 0.0–0.7)
EOS PCT: 3 %
HCT: 42.1 % (ref 39.0–52.0)
Hemoglobin: 14.7 g/dL (ref 13.0–17.0)
LYMPHS ABS: 1.5 10*3/uL (ref 0.7–4.0)
Lymphocytes Relative: 26 %
MCH: 31.2 pg (ref 26.0–34.0)
MCHC: 34.9 g/dL (ref 30.0–36.0)
MCV: 89.4 fL (ref 78.0–100.0)
MONOS PCT: 7 %
Monocytes Absolute: 0.4 10*3/uL (ref 0.1–1.0)
Neutro Abs: 3.8 10*3/uL (ref 1.7–7.7)
Neutrophils Relative %: 63 %
PLATELETS: 233 10*3/uL (ref 150–400)
RBC: 4.71 MIL/uL (ref 4.22–5.81)
RDW: 12.7 % (ref 11.5–15.5)
WBC: 5.9 10*3/uL (ref 4.0–10.5)

## 2018-02-26 LAB — BASIC METABOLIC PANEL
Anion gap: 10 (ref 5–15)
BUN: 13 mg/dL (ref 6–20)
CHLORIDE: 100 mmol/L — AB (ref 101–111)
CO2: 20 mmol/L — AB (ref 22–32)
Calcium: 9 mg/dL (ref 8.9–10.3)
Creatinine, Ser: 1.01 mg/dL (ref 0.61–1.24)
GFR calc Af Amer: >60 mL/min (ref >60)
GFR calc non Af Amer: >60 mL/min (ref >60)
Glucose, Bld: 576 mg/dL (ref 65–99)
Potassium: 5.1 mmol/L (ref 3.5–5.1)
Sodium: 130 mmol/L — ABNORMAL LOW (ref 135–145)

## 2018-02-26 LAB — CBG MONITORING, ED
GLUCOSE-CAPILLARY: 569 mg/dL — AB (ref 65–99)
Glucose-Capillary: 362 mg/dL — ABNORMAL HIGH (ref 65–99)

## 2018-02-26 LAB — I-STAT TROPONIN, ED: Troponin i, poc: 0 ng/mL (ref 0.00–0.08)

## 2018-02-26 MED ORDER — OXYCODONE-ACETAMINOPHEN 5-325 MG PO TABS: 1.0000 | ORAL_TABLET | Freq: Four times a day (QID) | ORAL | 0 refills | Status: DC | PRN

## 2018-02-26 MED ORDER — FENTANYL CITRATE (PF) 100 MCG/2ML IJ SOLN
50.0000 ug | Freq: Once | INTRAMUSCULAR | Status: AC
  Administered 2018-02-26: 50 ug via INTRAVENOUS
  Filled 2018-02-26: qty 2

## 2018-02-26 MED ORDER — ONDANSETRON HCL 4 MG/2ML IJ SOLN
4.0000 mg | Freq: Once | INTRAMUSCULAR | Status: AC
  Administered 2018-02-26: 4 mg via INTRAVENOUS
  Filled 2018-02-26: qty 2

## 2018-02-26 MED ORDER — INSULIN ASPART 100 UNIT/ML ~~LOC~~ SOLN
10.0000 [IU] | Freq: Once | SUBCUTANEOUS | Status: AC
  Administered 2018-02-26: 10 [IU] via SUBCUTANEOUS
  Filled 2018-02-26: qty 1

## 2018-02-26 MED ORDER — SODIUM CHLORIDE 0.9 % IV BOLUS
1000.0000 mL | Freq: Once | INTRAVENOUS | Status: AC
  Administered 2018-02-26: 1000 mL via INTRAVENOUS

## 2018-02-26 MED ORDER — MELOXICAM 15 MG PO TABS: 15.0000 mg | ORAL_TABLET | Freq: Every day | ORAL | 0 refills | Status: DC

## 2018-02-26 MED ORDER — BACLOFEN 10 MG PO TABS: 10.0000 mg | ORAL_TABLET | Freq: Three times a day (TID) | ORAL | 0 refills | Status: DC

## 2018-02-26 NOTE — ED Notes (Signed)
Bed: WA06 Expected date:  Expected time:  Means of arrival:  Comments: EMS-fall-blood thinners

## 2018-02-26 NOTE — ED Triage Notes (Addendum)
Patient arrived via [facility] from home Patient fell yesterday on left side outside. Patient denies hitting head and LOC. Patient is c/o of left arm, left neck, and left jaw pain. Sounds musculoskeletal. Hx. Of pinched nerves in neck. Patient has not had problem with that in years. CBG-500. HX of DM2 and MI. 12 lead normal per ems. Patient is on a blood thinner. Patient is anxious per ems. 150 mcg fentanyl given per ems. 150cc of fluid. 20g L-ac.
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
## 2018-02-26 NOTE — Discharge Instructions (Signed)
Get help right away if: You have severe pain, weakness, or numbness. You have difficulty with bladder or bowel contro

## 2019-05-19 ENCOUNTER — Emergency Department (HOSPITAL_BASED_OUTPATIENT_CLINIC_OR_DEPARTMENT_OTHER)
Admission: EM | Admit: 2019-05-19 | Discharge: 2019-05-19 | Disposition: A | Discharge status: Home / Self Care | Payer: Medicaid Other | Attending: Emergency Medicine | Admitting: Emergency Medicine

## 2019-05-19 ENCOUNTER — Encounter (HOSPITAL_BASED_OUTPATIENT_CLINIC_OR_DEPARTMENT_OTHER): Payer: Self-pay | Admitting: *Deleted

## 2019-05-19 ENCOUNTER — Other Ambulatory Visit: Payer: Self-pay

## 2019-05-19 DIAGNOSIS — Z955 Presence of coronary angioplasty implant and graft: Secondary | ICD-10-CM | POA: Insufficient documentation

## 2019-05-19 DIAGNOSIS — Z79899 Other long term (current) drug therapy: Secondary | ICD-10-CM | POA: Insufficient documentation

## 2019-05-19 DIAGNOSIS — M109 Gout, unspecified: Secondary | ICD-10-CM | POA: Insufficient documentation

## 2019-05-19 DIAGNOSIS — Z95810 Presence of automatic (implantable) cardiac defibrillator: Secondary | ICD-10-CM | POA: Diagnosis not present

## 2019-05-19 DIAGNOSIS — I10 Essential (primary) hypertension: Secondary | ICD-10-CM | POA: Insufficient documentation

## 2019-05-19 DIAGNOSIS — I2510 Atherosclerotic heart disease of native coronary artery without angina pectoris: Secondary | ICD-10-CM | POA: Insufficient documentation

## 2019-05-19 DIAGNOSIS — M2550 Pain in unspecified joint: Secondary | ICD-10-CM | POA: Diagnosis present

## 2019-05-19 MED ORDER — OXYCODONE-ACETAMINOPHEN 5-325 MG PO TABS: 1.0000 | ORAL_TABLET | Freq: Four times a day (QID) | ORAL | 0 refills | Status: DC | PRN

## 2019-05-19 MED ORDER — COLCHICINE 0.6 MG PO TABS: ORAL_TABLET | ORAL | 0 refills | Status: DC

## 2019-05-19 MED ORDER — KETOROLAC TROMETHAMINE 30 MG/ML IJ SOLN
30.0000 mg | Freq: Once | INTRAMUSCULAR | Status: AC
  Administered 2019-05-19: 30 mg via INTRAMUSCULAR
  Filled 2019-05-19: qty 1

## 2019-05-19 NOTE — ED Triage Notes (Signed)
Left knee and ankle pain x 3 days. He thinks he is having a gout flare.
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
## 2019-05-19 NOTE — Discharge Instructions (Addendum)
You were seen in the emergency department today with concern for a gout flare.  We are sending you home with colchicine and Percocet to help treat the pain.  Please take these medicines as prescribed.  -Percocet-this is a narcotic/controlled substance medication that has potential addicting qualities.  We recommend that you take 1-2 tablets every 6 hours as needed for severe pain.  Do not drive or operate heavy machinery when taking this medicine as it can be sedating. Do not drink alcohol or take other sedating medications when taking this medicine for safety reasons.  Keep this out of reach of small children.  Please be aware this medicine has Tylenol in it (325 mg/tab) do not exceed the maximum dose of Tylenol in a day per over the counter recommendations should you decide to supplement with Tylenol over the counter.   We have prescribed you new medication(s) today. Discuss the medications prescribed today with your pharmacist as they can have adverse effects and interactions with your other medicines including over the counter and prescribed medications. Seek medical evaluation if you start to experience new or abnormal symptoms after taking one of these medicines, seek care immediately if you start to experience difficulty breathing, feeling of your throat closing, facial swelling, or rash as these could be indications of a more serious allergic reaction  Please apply ice wrapped in a towel 20 minutes on 40 minutes off the next 24 to 48 hours.  Please keep the areas elevated.  Follow-up with primary care within 3 days.  Return to the ER for new or worsening symptoms including but not limited to fever, chills, inability to move the joint, increased warmth to these areas, or any other concerns.

## 2019-11-02 ENCOUNTER — Emergency Department (HOSPITAL_COMMUNITY): Payer: Medicaid Other

## 2019-11-02 ENCOUNTER — Encounter (HOSPITAL_COMMUNITY): Payer: Self-pay | Admitting: Emergency Medicine

## 2019-11-02 ENCOUNTER — Other Ambulatory Visit: Payer: Self-pay

## 2019-11-02 ENCOUNTER — Emergency Department (HOSPITAL_COMMUNITY)
Admission: EM | Admit: 2019-11-02 | Discharge: 2019-11-03 | Disposition: A | Discharge status: Home / Self Care | Payer: Medicaid Other | Attending: Emergency Medicine | Admitting: Emergency Medicine

## 2019-11-02 DIAGNOSIS — I2510 Atherosclerotic heart disease of native coronary artery without angina pectoris: Secondary | ICD-10-CM | POA: Insufficient documentation

## 2019-11-02 DIAGNOSIS — Z95810 Presence of automatic (implantable) cardiac defibrillator: Secondary | ICD-10-CM | POA: Diagnosis not present

## 2019-11-02 DIAGNOSIS — Z79899 Other long term (current) drug therapy: Secondary | ICD-10-CM | POA: Diagnosis not present

## 2019-11-02 DIAGNOSIS — J449 Chronic obstructive pulmonary disease, unspecified: Secondary | ICD-10-CM | POA: Diagnosis not present

## 2019-11-02 DIAGNOSIS — M5412 Radiculopathy, cervical region: Secondary | ICD-10-CM | POA: Diagnosis not present

## 2019-11-02 DIAGNOSIS — M25512 Pain in left shoulder: Secondary | ICD-10-CM | POA: Diagnosis present

## 2019-11-02 DIAGNOSIS — Z950 Presence of cardiac pacemaker: Secondary | ICD-10-CM | POA: Diagnosis not present

## 2019-11-02 DIAGNOSIS — I10 Essential (primary) hypertension: Secondary | ICD-10-CM | POA: Insufficient documentation

## 2019-11-02 DIAGNOSIS — R079 Chest pain, unspecified: Secondary | ICD-10-CM | POA: Insufficient documentation

## 2019-11-02 LAB — CBC
HCT: 44.9 % (ref 39.0–52.0)
Hemoglobin: 14.7 g/dL (ref 13.0–17.0)
MCH: 29.9 pg (ref 26.0–34.0)
MCHC: 32.7 g/dL (ref 30.0–36.0)
MCV: 91.3 fL (ref 80.0–100.0)
Platelets: 358 10*3/uL (ref 150–400)
RBC: 4.92 MIL/uL (ref 4.22–5.81)
RDW: 13.1 % (ref 11.5–15.5)
WBC: 11.5 10*3/uL — ABNORMAL HIGH (ref 4.0–10.5)
nRBC: 0 % (ref 0.0–0.2)

## 2019-11-02 LAB — BASIC METABOLIC PANEL
Anion gap: 12 (ref 5–15)
BUN: 10 mg/dL (ref 6–20)
CO2: 26 mmol/L (ref 22–32)
Calcium: 9.6 mg/dL (ref 8.9–10.3)
Chloride: 100 mmol/L (ref 98–111)
Creatinine, Ser: 1.15 mg/dL (ref 0.61–1.24)
GFR calc Af Amer: >60 mL/min (ref >60)
GFR calc non Af Amer: >60 mL/min (ref >60)
Glucose, Bld: 149 mg/dL — ABNORMAL HIGH (ref 70–99)
Potassium: 3.7 mmol/L (ref 3.5–5.1)
Sodium: 138 mmol/L (ref 135–145)

## 2019-11-02 LAB — TROPONIN I (HIGH SENSITIVITY): Troponin I (High Sensitivity): 7 ng/L (ref <18)

## 2019-11-02 MED ORDER — SODIUM CHLORIDE 0.9% FLUSH: 3.0000 mL | Freq: Once | INTRAVENOUS | Status: DC

## 2019-11-02 NOTE — ED Triage Notes (Signed)
Pt here for eval of left jaw pain, neck, arm and chest pain that started yesterday. Hx of MI. States the pain started yesterday and got worse today.

## 2019-11-02 NOTE — ED Notes (Addendum)
Pt complaining of increased chest and jaw pain. EKG done, given to dr. Rex Kras

## 2019-11-03 LAB — TROPONIN I (HIGH SENSITIVITY): Troponin I (High Sensitivity): 9 ng/L (ref <18)

## 2019-11-03 MED ORDER — HYDROCODONE-ACETAMINOPHEN 5-325 MG PO TABS: 1.0000 | ORAL_TABLET | ORAL | 0 refills | Status: DC | PRN

## 2019-11-03 MED ORDER — HYDROCODONE-ACETAMINOPHEN 5-325 MG PO TABS
1.0000 | ORAL_TABLET | Freq: Once | ORAL | Status: AC
  Administered 2019-11-03: 1 via ORAL
  Filled 2019-11-03: qty 1

## 2019-11-03 MED ORDER — CYCLOBENZAPRINE HCL 10 MG PO TABS: 10.0000 mg | ORAL_TABLET | Freq: Three times a day (TID) | ORAL | 0 refills | Status: DC | PRN

## 2019-11-03 NOTE — ED Notes (Signed)
Discharge instructions discussed with pt. Pt verbalized understanding. Pt stable and ambulatory. No signature pad available. 

## 2019-11-03 NOTE — ED Notes (Signed)
Ortho tech at bedside placing C-collar

## 2019-11-03 NOTE — Progress Notes (Signed)
Orthopedic Tech Progress Note Patient Details:  Gerald Simpson [birth year] [id number]  Ortho Devices Type of Ortho Device: Soft collar Ortho Device/Splint Interventions: Ordered, Application, Adjustment   Post Interventions Patient Tolerated: Well Instructions Provided: Care of device, Adjustment of device   Karolee Stamps 11/03/2019, 5:24 AM

## 2019-11-03 NOTE — ED Notes (Signed)
ED tech attempted x2 for 3rd troponin.
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
## 2019-11-03 NOTE — Discharge Instructions (Addendum)
Apply ice as needed.  Wear the collar as needed.  Take acetaminophen as needed for less severe pain.

## 2020-08-18 ENCOUNTER — Encounter (HOSPITAL_BASED_OUTPATIENT_CLINIC_OR_DEPARTMENT_OTHER): Payer: Self-pay | Admitting: *Deleted

## 2020-08-18 ENCOUNTER — Emergency Department (HOSPITAL_BASED_OUTPATIENT_CLINIC_OR_DEPARTMENT_OTHER)
Admission: EM | Admit: 2020-08-18 | Discharge: 2020-08-18 | Disposition: A | Discharge status: Home / Self Care | Payer: Medicaid Other | Attending: Emergency Medicine | Admitting: Emergency Medicine

## 2020-08-18 ENCOUNTER — Other Ambulatory Visit: Payer: Self-pay

## 2020-08-18 DIAGNOSIS — F17220 Nicotine dependence, chewing tobacco, uncomplicated: Secondary | ICD-10-CM | POA: Insufficient documentation

## 2020-08-18 DIAGNOSIS — Z79899 Other long term (current) drug therapy: Secondary | ICD-10-CM | POA: Insufficient documentation

## 2020-08-18 DIAGNOSIS — I2510 Atherosclerotic heart disease of native coronary artery without angina pectoris: Secondary | ICD-10-CM | POA: Diagnosis not present

## 2020-08-18 DIAGNOSIS — I10 Essential (primary) hypertension: Secondary | ICD-10-CM | POA: Diagnosis not present

## 2020-08-18 DIAGNOSIS — Z7982 Long term (current) use of aspirin: Secondary | ICD-10-CM | POA: Diagnosis not present

## 2020-08-18 DIAGNOSIS — R062 Wheezing: Secondary | ICD-10-CM | POA: Insufficient documentation

## 2020-08-18 DIAGNOSIS — Z950 Presence of cardiac pacemaker: Secondary | ICD-10-CM | POA: Diagnosis not present

## 2020-08-18 DIAGNOSIS — M542 Cervicalgia: Secondary | ICD-10-CM | POA: Insufficient documentation

## 2020-08-18 MED ORDER — ALBUTEROL SULFATE HFA 108 (90 BASE) MCG/ACT IN AERS
4.0000 | INHALATION_SPRAY | Freq: Once | RESPIRATORY_TRACT | Status: AC
  Administered 2020-08-18: 4 via RESPIRATORY_TRACT
  Filled 2020-08-18: qty 6.7

## 2020-08-18 MED ORDER — DEXAMETHASONE SODIUM PHOSPHATE 10 MG/ML IJ SOLN
10.0000 mg | Freq: Once | INTRAMUSCULAR | Status: AC
  Administered 2020-08-18: 10 mg via INTRAMUSCULAR
  Filled 2020-08-18: qty 1

## 2020-08-18 MED ORDER — HYDROCODONE-ACETAMINOPHEN 5-325 MG PO TABS: 1.0000 | ORAL_TABLET | Freq: Four times a day (QID) | ORAL | 0 refills | Status: DC | PRN

## 2020-08-18 MED ORDER — HYDROCODONE-ACETAMINOPHEN 5-325 MG PO TABS
1.0000 | ORAL_TABLET | Freq: Once | ORAL | Status: AC
  Administered 2020-08-18: 1 via ORAL
  Filled 2020-08-18: qty 1

## 2020-08-18 MED ORDER — LIDOCAINE 5 % EX PTCH: 1.0000 | MEDICATED_PATCH | CUTANEOUS | 0 refills | Status: DC

## 2020-08-18 NOTE — Discharge Instructions (Addendum)
Take medications as directed.   Prescription given for Norco. Take medication as directed and do not operate machinery, drive a car, or work while taking this medication as it can make you drowsy.  This medication can also make you constipated. Please take miralax if you become constipated.   Please follow up with your primary care provider within 5-7 days for re-evaluation of your symptoms. If you do not have a primary care provider, information for a healthcare clinic has been provided for you to make arrangements for follow up care. Please return to the emergency department for any new or worsening symptoms.

## 2020-08-18 NOTE — ED Notes (Signed)
Patient BBS wheezes, diminished BS with oxygen saturations 98%, RR 18. Patient explained visiting from [place] to visit daughter. Patient diagnosed with black lung 2 years ago.  Patient uses Albuterol MDI, Combivent and Trilogy. Patient tolerated medications well.

## 2020-08-18 NOTE — ED Triage Notes (Signed)
C/o right neck pain , increased pain with movt , hx disc disease
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
## 2021-06-09 ENCOUNTER — Emergency Department (HOSPITAL_BASED_OUTPATIENT_CLINIC_OR_DEPARTMENT_OTHER): Payer: Medicaid Other

## 2021-06-09 ENCOUNTER — Other Ambulatory Visit: Payer: Self-pay

## 2021-06-09 ENCOUNTER — Emergency Department (HOSPITAL_BASED_OUTPATIENT_CLINIC_OR_DEPARTMENT_OTHER)
Admission: EM | Admit: 2021-06-09 | Discharge: 2021-06-09 | Disposition: A | Discharge status: Home / Self Care | Payer: Medicaid Other | Attending: Emergency Medicine | Admitting: Emergency Medicine

## 2021-06-09 ENCOUNTER — Encounter (HOSPITAL_BASED_OUTPATIENT_CLINIC_OR_DEPARTMENT_OTHER): Payer: Self-pay | Admitting: Emergency Medicine

## 2021-06-09 DIAGNOSIS — R1084 Generalized abdominal pain: Secondary | ICD-10-CM | POA: Insufficient documentation

## 2021-06-09 DIAGNOSIS — I10 Essential (primary) hypertension: Secondary | ICD-10-CM | POA: Insufficient documentation

## 2021-06-09 DIAGNOSIS — I2510 Atherosclerotic heart disease of native coronary artery without angina pectoris: Secondary | ICD-10-CM | POA: Insufficient documentation

## 2021-06-09 DIAGNOSIS — Z955 Presence of coronary angioplasty implant and graft: Secondary | ICD-10-CM | POA: Insufficient documentation

## 2021-06-09 DIAGNOSIS — Z95810 Presence of automatic (implantable) cardiac defibrillator: Secondary | ICD-10-CM | POA: Insufficient documentation

## 2021-06-09 DIAGNOSIS — Z7982 Long term (current) use of aspirin: Secondary | ICD-10-CM | POA: Insufficient documentation

## 2021-06-09 DIAGNOSIS — Z79899 Other long term (current) drug therapy: Secondary | ICD-10-CM | POA: Diagnosis not present

## 2021-06-09 DIAGNOSIS — R1030 Lower abdominal pain, unspecified: Secondary | ICD-10-CM | POA: Insufficient documentation

## 2021-06-09 DIAGNOSIS — F17220 Nicotine dependence, chewing tobacco, uncomplicated: Secondary | ICD-10-CM | POA: Insufficient documentation

## 2021-06-09 DIAGNOSIS — J45909 Unspecified asthma, uncomplicated: Secondary | ICD-10-CM | POA: Insufficient documentation

## 2021-06-09 DIAGNOSIS — Z794 Long term (current) use of insulin: Secondary | ICD-10-CM | POA: Insufficient documentation

## 2021-06-09 DIAGNOSIS — R748 Abnormal levels of other serum enzymes: Secondary | ICD-10-CM | POA: Insufficient documentation

## 2021-06-09 LAB — CBC
HCT: 41 % (ref 39.0–52.0)
Hemoglobin: 14.7 g/dL (ref 13.0–17.0)
MCH: 31.8 pg (ref 26.0–34.0)
MCHC: 35.9 g/dL (ref 30.0–36.0)
MCV: 88.7 fL (ref 80.0–100.0)
Platelets: 278 10*3/uL (ref 150–400)
RBC: 4.62 MIL/uL (ref 4.22–5.81)
RDW: 13.5 % (ref 11.5–15.5)
WBC: 11.3 10*3/uL — ABNORMAL HIGH (ref 4.0–10.5)
nRBC: 0 % (ref 0.0–0.2)

## 2021-06-09 LAB — COMPREHENSIVE METABOLIC PANEL
ALT: 26 U/L (ref 0–44)
AST: 16 U/L (ref 15–41)
Albumin: 4.3 g/dL (ref 3.5–5.0)
Alkaline Phosphatase: 45 U/L (ref 38–126)
Anion gap: 12 (ref 5–15)
BUN: 7 mg/dL (ref 6–20)
CO2: 23 mmol/L (ref 22–32)
Calcium: 8.4 mg/dL — ABNORMAL LOW (ref 8.9–10.3)
Chloride: 106 mmol/L (ref 98–111)
Creatinine, Ser: 0.91 mg/dL (ref 0.61–1.24)
GFR, Estimated: >60 mL/min (ref >60)
Glucose, Bld: 111 mg/dL — ABNORMAL HIGH (ref 70–99)
Potassium: 3.2 mmol/L — ABNORMAL LOW (ref 3.5–5.1)
Sodium: 141 mmol/L (ref 135–145)
Total Bilirubin: 0.4 mg/dL (ref 0.3–1.2)
Total Protein: 6.9 g/dL (ref 6.5–8.1)

## 2021-06-09 LAB — URINALYSIS, ROUTINE W REFLEX MICROSCOPIC
Bilirubin Urine: NEGATIVE
Glucose, UA: NEGATIVE mg/dL
Hgb urine dipstick: NEGATIVE
Ketones, ur: NEGATIVE mg/dL
Leukocytes,Ua: NEGATIVE
Nitrite: NEGATIVE
Specific Gravity, Urine: 1.022 (ref 1.005–1.030)
pH: 6 (ref 5.0–8.0)

## 2021-06-09 LAB — LIPASE, BLOOD: Lipase: 145 U/L — ABNORMAL HIGH (ref 11–51)

## 2021-06-09 MED ORDER — IOHEXOL 300 MG/ML  SOLN
100.0000 mL | Freq: Once | INTRAMUSCULAR | Status: AC | PRN
  Administered 2021-06-09: 80 mL via INTRAVENOUS

## 2021-06-09 MED ORDER — METAMUCIL SMOOTH TEXTURE 58.6 % PO POWD: 1.0000 | Freq: Three times a day (TID) | ORAL | 12 refills | Status: DC

## 2021-06-09 MED ORDER — FENTANYL CITRATE (PF) 100 MCG/2ML IJ SOLN
50.0000 ug | Freq: Once | INTRAMUSCULAR | Status: AC
Start: 2021-06-09 — End: 2021-06-09
  Administered 2021-06-09: 50 ug via INTRAVENOUS
  Filled 2021-06-09: qty 2
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
## 2021-06-09 NOTE — ED Triage Notes (Signed)
Pt via pov from home with lower abdominal pain x 2 months, worsening in last 2 days. Pt states she has hx of pancreatitis. Pt points to lower abdomen as well as RUQ and his back for pain locations. Pt also has cardiac hx. Pt alert & oriented, moaning and writhing during triage.

## 2021-06-09 NOTE — ED Notes (Addendum)
Pt is aware we need a urine specimen, urinal @ bedside. Pt refused to be placed on cardiac monitor.

## 2021-06-09 NOTE — Discharge Instructions (Addendum)
Your lipase is elevated.  You are given referral to follow-up with gastroenterology. Please take Metamucil as prescribed to soften your stool and help with any symptoms of constipation. If you are unable to tolerate liquids, pain becomes worse, please be reevaluated.

## 2021-12-07 ENCOUNTER — Other Ambulatory Visit: Payer: Self-pay

## 2021-12-07 ENCOUNTER — Emergency Department (HOSPITAL_BASED_OUTPATIENT_CLINIC_OR_DEPARTMENT_OTHER)
Admission: EM | Admit: 2021-12-07 | Discharge: 2021-12-07 | Disposition: A | Discharge status: Home / Self Care | Payer: Medicaid Other | Attending: Emergency Medicine | Admitting: Emergency Medicine

## 2021-12-07 ENCOUNTER — Encounter (HOSPITAL_BASED_OUTPATIENT_CLINIC_OR_DEPARTMENT_OTHER): Payer: Self-pay

## 2021-12-07 ENCOUNTER — Emergency Department (HOSPITAL_BASED_OUTPATIENT_CLINIC_OR_DEPARTMENT_OTHER): Payer: Medicaid Other | Admitting: Radiology

## 2021-12-07 DIAGNOSIS — Z794 Long term (current) use of insulin: Secondary | ICD-10-CM | POA: Diagnosis not present

## 2021-12-07 DIAGNOSIS — Z7982 Long term (current) use of aspirin: Secondary | ICD-10-CM | POA: Diagnosis not present

## 2021-12-07 DIAGNOSIS — Z79899 Other long term (current) drug therapy: Secondary | ICD-10-CM | POA: Insufficient documentation

## 2021-12-07 DIAGNOSIS — M25512 Pain in left shoulder: Secondary | ICD-10-CM | POA: Diagnosis not present

## 2021-12-07 HISTORY — DX: Type 2 diabetes mellitus without complications: E11.9

## 2021-12-07 MED ORDER — HYDROCODONE-ACETAMINOPHEN 5-325 MG PO TABS: 1.0000 | ORAL_TABLET | Freq: Four times a day (QID) | ORAL | 0 refills | Status: DC | PRN

## 2021-12-07 NOTE — Discharge Instructions (Addendum)
It was a pleasure taking care of you today!   Your xrays were negative for fracture or dislocation.  Attached is information for the on-call sports medicine doctor, call today to set up a follow-up appointment regarding today's ED visit.  You will be sent a prescription for Norco, take as prescribed.  Do not drive or operate heavy machinery while taking this medication.  You may place ice to the affected area for up to 15 minutes at a time. Ensure to place a barrier between your skin and the ice. You may follow up with your primary care as needed. Return to the ED if you are experiencing increasing/worsening color change, pain, fever, or worsening symptoms.

## 2021-12-07 NOTE — ED Triage Notes (Signed)
Pt states he has chronic neck pain but left shoulder pain started 2 weeks ago after lifting something. Limited mobility in left arm.

## 2021-12-07 NOTE — ED Provider Notes (Signed)
[facility] EMERGENCY DEPT Provider Note   CSN: [id number] Arrival date & time: 12/07/21  1445     History  Chief Complaint  Patient presents with   Shoulder Pain    Gerald Simpson is a 56 y.o. male who presents to the ED complaining of left shoulder pain onset 2 weeks ago.  Patient notes that he lifted an item and noticed pain to his left shoulder.  He also notes a history of chronic neck pain.  Patient notes that he has had intermittent left shoulder pain for "a while" however never to this extent.  Has not tried medications for his symptoms.  Denies fever, chills, redness, wound, numbness, tingling.  The history is provided by the patient. No language interpreter was used.      Home Medications Prior to Admission medications   Medication Sig Start Date End Date Taking? Authorizing Provider  HYDROcodone-acetaminophen (NORCO/VICODIN) 5-325 MG tablet Take 1 tablet by mouth every 6 (six) hours as needed. 12/07/21  Yes Marina Boerner A, PA-C  ADMELOG SOLOSTAR 100 UNIT/ML KwikPen Inject 7 Units into the skin 3 (three) times daily between meals. 10/13/19   [provider]  albuterol (PROVENTIL) (2.5 MG/3ML) 0.083% nebulizer solution Take 2.5 mg by nebulization daily as needed for wheezing or shortness of breath.    [provider]  allopurinol (ZYLOPRIM) 100 MG tablet Take 100 mg by mouth daily. 10/16/19   [provider]  aspirin 81 MG chewable tablet Chew 81 mg by mouth daily. 08/07/19   [provider]  cyclobenzaprine (FLEXERIL) 10 MG tablet Take 1 tablet (10 mg total) by mouth 3 (three) times daily as needed for muscle spasms. 77/41/28   Delora Fuel, MD  ENTRESTO 24-26 MG Take 1 tablet by mouth 2 (two) times daily. 10/13/19   [provider]  furosemide (LASIX) 20 MG tablet Take 20 mg by mouth 2 (two) times daily.  10/13/19   [provider]  gabapentin (NEURONTIN) 800 MG tablet Take 800 mg by mouth 3 (three) times daily as  needed (for pain).  10/17/19   [provider]  Insulin Glargine (BASAGLAR KWIKPEN) 100 UNIT/ML SOPN Inject 50 Units into the skin at bedtime. 09/20/19   [provider]  lidocaine (LIDODERM) 5 % Place 1 patch onto the skin daily. Remove & Discard patch within 12 hours or as directed by MD 08/18/20   Couture, Cortni S, PA-C  metFORMIN (GLUCOPHAGE) 1000 MG tablet Take 1,000 mg by mouth 2 (two) times daily. 10/13/19   [provider]  metoprolol tartrate (LOPRESSOR) 50 MG tablet Take 50 mg by mouth 2 (two) times daily.    [provider]  montelukast (SINGULAIR) 10 MG tablet Take 10 mg by mouth at bedtime as needed (for allergies).     [provider]  nitroGLYCERIN (NITROSTAT) 0.4 MG SL tablet Place 0.4 mg under the tongue every 5 (five) minutes as needed for chest pain.     [provider]  psyllium (METAMUCIL SMOOTH TEXTURE) 58.6 % powder Take 1 packet by mouth 3 (three) times daily. 06/09/21   Pattricia Boss, MD  ticagrelor (BRILINTA) 90 MG TABS tablet Take 90 mg by mouth 2 (two) times daily.    [provider]  colchicine 0.6 MG tablet Day 1: Oral: 1.2 mg at the first sign of flare, followed in 1 hour with a single dose of 0.6 mg Day 2 and thereafter: Oral: 0.6 mg once daily until 1-2 days after flare has resolved. Patient not  taking: Reported on 11/03/2019 05/19/19 11/03/19  Petrucelli, Aldona Bar R, PA-C      Allergies    Other, Tramadol, Isosorbide nitrate, and Morphine and related    Review of Systems   Review of Systems  Constitutional:  Negative for chills and fever.  Musculoskeletal:  Positive for arthralgias. Negative for joint swelling.  Skin:  Negative for color change and wound.  All other systems reviewed and are negative.  Physical Exam Updated Vital Signs BP (!) 137/108 (BP Location: Right Arm)    Pulse 76    Temp 97.9 F (36.6 C) (Oral)    Resp 19    Ht 5\' 10"  (1.778 m)    Wt 131.5 kg    SpO2 98%    BMI 41.61 kg/m   Physical Exam Vitals and nursing note reviewed.  Constitutional:      General: He is not in acute distress.    Appearance: Normal appearance.  Eyes:     General: No scleral icterus.    Extraocular Movements: Extraocular movements intact.  Cardiovascular:     Rate and Rhythm: Normal rate.  Pulmonary:     Effort: Pulmonary effort is normal. No respiratory distress.  Musculoskeletal:     Right shoulder: Normal.     Left shoulder: Tenderness present. No swelling, deformity or bony tenderness. Decreased range of motion (Secondary to pain). Decreased strength (Secondary to pain). Normal pulse.     Cervical back: Neck supple.     Comments: Mild tenderness to palpation to anterior left shoulder. No obvious deformity, effusion, erythema, or swelling.  Decreased range of motion of the left shoulder secondary to pain.  No increased warmth noted to the left shoulder or left clavicle.  No tenderness to palpation noted to lateral neck.  Radial pulses intact bilaterally.  Cap refill intact bilaterally.  No tenderness noted to left olecranon.  No tenderness noted to left bicep tendon.  No tenderness to palpation noted to left clavicle.  No overlying skin changes or obvious deformity noted to left clavicle.  Sensation intact to bilateral upper extremities.  Decreased strength noted to left upper extremity secondary to pain.  Skin:    General: Skin is warm and dry.     Findings: No bruising, erythema or rash.  Neurological:     Mental Status: He is alert.  Psychiatric:        Behavior: Behavior normal.    ED Results / Procedures / Treatments   Labs (all labs ordered are listed, but only abnormal results are displayed) Labs Reviewed - No data to display  EKG None  Radiology DG Clavicle Left  Result Date: 12/07/2021 CLINICAL DATA:  Pain EXAM: LEFT CLAVICLE - 2+ VIEWS COMPARISON:  None. FINDINGS: There is no evidence of fracture or other focal bone lesions. Soft tissues are unremarkable. Pacemaker  leads are noted in the course of the subclavian vessels. IMPRESSION: No radiographic abnormality is seen in the left clavicle Electronically Signed   By: Elmer Picker M.D.   On: 12/07/2021 15:55   DG Shoulder Left  Result Date: 12/07/2021 CLINICAL DATA:  pain EXAM: LEFT SHOULDER - 2+ VIEW COMPARISON:  X-ray left shoulder 05/28/2006 FINDINGS: There is no evidence of fracture or dislocation. There is no evidence of arthropathy or other focal bone abnormality. Soft tissues are unremarkable. IMPRESSION: No acute displaced fracture or dislocation. Electronically Signed   By: Iven Finn M.D.   On: 12/07/2021 15:54    Procedures Procedures    Medications Ordered in ED Medications -  No data to display  ED Course/ Medical Decision Making/ A&P Clinical Course as of 12/07/21 1643  Wed Dec 07, 2021  1603 Discussed in detail with the patient regarding discharge treatment plan.  Also had a lengthy discussion with patient regarding that typically narcotics are not given from the ED if there is no fracture or dislocation.  Patient aware of this and appreciable at this time.  Per review of PDMP, patient has had Norco in the past most recently in November 2022.  Patient appears safe for discharge. [SB]  1607 Discussed imaging findings with patient at bedside. Pt agreeable to discharge treatment plan. Pt appears safe for discharge. [SB]    Clinical Course User Index [SB] Vallery Mcdade A, PA-C                           Medical Decision Making Amount and/or Complexity of Data Reviewed Radiology: ordered.  Risk Prescription drug management.   Patient with left shoulder pain onset 2 weeks ago after lifting a heavy item. On exam, patient with mild tenderness to palpation to anterior left shoulder. No edema, erythema, effusion. Sensation and pulses intact bilaterally to upper extremities.  Decreased strength noted to left shoulder secondary to pain.  Differential diagnosis includes AC joint  separation, fracture, dislocation, rotator cuff pathology, septic arthritis, osteoarthritis.  Imaging: I ordered imaging studies including left shoulder x-ray and left clavicle x-ray I independently visualized and interpreted imaging which showed: Negative for acute fracture or dislocation I agree with the radiologist interpretation   Disposition: Patient presentation suspicious for rotator cuff pathology as cause of patient presentation.  Doubt fracture, dislocation at this time.  Doubt septic arthritis, no increased warmth, erythema, patient afebrile.  Doubt osteoarthritis.  Doubt AC joint separation, no evidence on x-ray.  After consideration of the diagnostic results and the patients response to treatment, I feel that the patient would benefit from discharge home with sports medicine follow-up.  Patient given prescription for Lortab from the ED.  See ED course regarding detailed discussion on use of narcotics in the ED.  Supportive care measures and strict return precautions discussed with patient at bedside. Pt acknowledges and verbalizes understanding. Pt appears safe for discharge. Follow up as indicated in discharge paperwork.    This chart was dictated using voice recognition software, Dragon. Despite the best efforts of this provider to proofread and correct errors, errors may still occur which can change documentation meaning.  Final Clinical Impression(s) / ED Diagnoses Final diagnoses:  Acute pain of left shoulder    Rx / DC Orders ED Discharge Orders          Ordered    HYDROcodone-acetaminophen (NORCO/VICODIN) 5-325 MG tablet  Every 6 hours PRN        12/07/21 1637              Andre Swander A, PA-C 12/07/21 1643    Luna Fuse, MD 12/16/21 [phone]

## 2022-01-06 ENCOUNTER — Other Ambulatory Visit: Payer: Self-pay

## 2022-01-06 ENCOUNTER — Encounter (HOSPITAL_COMMUNITY): Payer: Self-pay

## 2022-01-06 ENCOUNTER — Emergency Department (HOSPITAL_COMMUNITY)
Admission: EM | Admit: 2022-01-06 | Discharge: 2022-01-06 | Disposition: A | Discharge status: Home / Self Care | Payer: Medicaid Other | Attending: Emergency Medicine | Admitting: Emergency Medicine

## 2022-01-06 ENCOUNTER — Emergency Department (HOSPITAL_COMMUNITY): Payer: Medicaid Other

## 2022-01-06 DIAGNOSIS — M549 Dorsalgia, unspecified: Secondary | ICD-10-CM | POA: Diagnosis not present

## 2022-01-06 DIAGNOSIS — E119 Type 2 diabetes mellitus without complications: Secondary | ICD-10-CM | POA: Diagnosis not present

## 2022-01-06 DIAGNOSIS — Z794 Long term (current) use of insulin: Secondary | ICD-10-CM | POA: Insufficient documentation

## 2022-01-06 DIAGNOSIS — M542 Cervicalgia: Secondary | ICD-10-CM | POA: Insufficient documentation

## 2022-01-06 DIAGNOSIS — M25512 Pain in left shoulder: Secondary | ICD-10-CM | POA: Insufficient documentation

## 2022-01-06 DIAGNOSIS — I2510 Atherosclerotic heart disease of native coronary artery without angina pectoris: Secondary | ICD-10-CM | POA: Insufficient documentation

## 2022-01-06 DIAGNOSIS — M5412 Radiculopathy, cervical region: Secondary | ICD-10-CM

## 2022-01-06 DIAGNOSIS — Z7984 Long term (current) use of oral hypoglycemic drugs: Secondary | ICD-10-CM | POA: Diagnosis not present

## 2022-01-06 DIAGNOSIS — Z79899 Other long term (current) drug therapy: Secondary | ICD-10-CM | POA: Diagnosis not present

## 2022-01-06 DIAGNOSIS — Z7982 Long term (current) use of aspirin: Secondary | ICD-10-CM | POA: Insufficient documentation

## 2022-01-06 DIAGNOSIS — R519 Headache, unspecified: Secondary | ICD-10-CM | POA: Insufficient documentation

## 2022-01-06 DIAGNOSIS — M25519 Pain in unspecified shoulder: Secondary | ICD-10-CM

## 2022-01-06 LAB — CBC WITH DIFFERENTIAL/PLATELET
Abs Immature Granulocytes: 0.03 10*3/uL (ref 0.00–0.07)
Basophils Absolute: 0.1 10*3/uL (ref 0.0–0.1)
Basophils Relative: 1 %
Eosinophils Absolute: 0.6 10*3/uL — ABNORMAL HIGH (ref 0.0–0.5)
Eosinophils Relative: 6 %
HCT: 45.7 % (ref 39.0–52.0)
Hemoglobin: 15.9 g/dL (ref 13.0–17.0)
Immature Granulocytes: 0 %
Lymphocytes Relative: 20 %
Lymphs Abs: 1.8 10*3/uL (ref 0.7–4.0)
MCH: 32.9 pg (ref 26.0–34.0)
MCHC: 34.8 g/dL (ref 30.0–36.0)
MCV: 94.4 fL (ref 80.0–100.0)
Monocytes Absolute: 0.8 10*3/uL (ref 0.1–1.0)
Monocytes Relative: 9 %
Neutro Abs: 5.9 10*3/uL (ref 1.7–7.7)
Neutrophils Relative %: 64 %
Platelets: 286 10*3/uL (ref 150–400)
RBC: 4.84 MIL/uL (ref 4.22–5.81)
RDW: 13.2 % (ref 11.5–15.5)
WBC: 9.2 10*3/uL (ref 4.0–10.5)
nRBC: 0 % (ref 0.0–0.2)

## 2022-01-06 LAB — TROPONIN I (HIGH SENSITIVITY)
Troponin I (High Sensitivity): 9 ng/L (ref <18)
Troponin I (High Sensitivity): 10 ng/L (ref <18)

## 2022-01-06 LAB — URINALYSIS, ROUTINE W REFLEX MICROSCOPIC
Bilirubin Urine: NEGATIVE
Glucose, UA: NEGATIVE mg/dL
Hgb urine dipstick: NEGATIVE
Ketones, ur: NEGATIVE mg/dL
Leukocytes,Ua: NEGATIVE
Nitrite: NEGATIVE
Protein, ur: NEGATIVE mg/dL
Specific Gravity, Urine: 1.008 (ref 1.005–1.030)
pH: 6 (ref 5.0–8.0)

## 2022-01-06 LAB — BASIC METABOLIC PANEL
Anion gap: 10 (ref 5–15)
BUN: 6 mg/dL (ref 6–20)
CO2: 23 mmol/L (ref 22–32)
Calcium: 9 mg/dL (ref 8.9–10.3)
Chloride: 102 mmol/L (ref 98–111)
Creatinine, Ser: 1.03 mg/dL (ref 0.61–1.24)
GFR, Estimated: >60 mL/min (ref >60)
Glucose, Bld: 189 mg/dL — ABNORMAL HIGH (ref 70–99)
Potassium: 4 mmol/L (ref 3.5–5.1)
Sodium: 135 mmol/L (ref 135–145)

## 2022-01-06 MED ORDER — ACETAMINOPHEN 325 MG PO TABS: 650.0000 mg | ORAL_TABLET | Freq: Four times a day (QID) | ORAL | 0 refills | Status: DC | PRN

## 2022-01-06 MED ORDER — OXYCODONE HCL 5 MG PO TABS: 5.0000 mg | ORAL_TABLET | Freq: Three times a day (TID) | ORAL | 0 refills | Status: DC | PRN

## 2022-01-06 MED ORDER — GABAPENTIN 800 MG PO TABS: 800.0000 mg | ORAL_TABLET | Freq: Three times a day (TID) | ORAL | 0 refills | Status: DC | PRN

## 2022-01-06 MED ORDER — OXYCODONE-ACETAMINOPHEN 5-325 MG PO TABS
1.0000 | ORAL_TABLET | Freq: Once | ORAL | Status: AC
  Administered 2022-01-06: 1 via ORAL
  Filled 2022-01-06: qty 1

## 2022-01-06 NOTE — Discharge Instructions (Addendum)
Please do NOT take oxycodone at the same time as gabapentin.  Both of these medicines can make you woozy, too drowsy, and at risk for overdose.  Do not drive after taking either of these medications. ?

## 2022-01-06 NOTE — ED Triage Notes (Signed)
Pain in left face, left arm and left chest/back area. Started 2 days ago, worse today.  ?

## 2022-01-06 NOTE — ED Provider Notes (Signed)
[facility] EMERGENCY DEPARTMENT Provider Note   CSN: [id number] Arrival date & time: 01/06/22  1328     History  Chief Complaint  Patient presents with   Facial Pain   Chest Pain   Back Pain    Gerald Simpson is a 56 y.o. male w/ hx of pacemaker, CAD, presenting with department with neck pain and arm pain.  Patient reports is ongoing for about a month and a half.  He reports that she has excruciating pain in his left neck that seems to radiate up into the left half of his face, and also down his left arm into his left hand, and also down towards his left hip.  It is worse with any lateral movement of his head.  He denies any preceding falls or trauma.  He stated similar symptoms on the right side in the past which were treated with pain medications and muscle relaxers.  [place] reports difficulties with insurance (applying now), is trying to establish PCP care.  He reports he is diabetic and cannot tolerate steroids as they cause high blood sugars and also because "me to feel really jittery inside and shaky".  He is on [place] for his cardiac conditions, and states he cannot have NSAIDs because of this.  He is prescribed gabapentin 800 mg 3 times daily as needed for diabetic neuropathy, does not take this regularly.  HPI     Home Medications Prior to Admission medications   Medication Sig Start Date End Date Taking? Authorizing Provider  ADMELOG SOLOSTAR 100 UNIT/ML KwikPen Inject 7 Units into the skin 3 (three) times daily between meals. 10/13/19   [provider]  albuterol (PROVENTIL) (2.5 MG/3ML) 0.083% nebulizer solution Take 2.5 mg by nebulization daily as needed for wheezing or shortness of breath.    [provider]  allopurinol (ZYLOPRIM) 100 MG tablet Take 100 mg by mouth daily. 10/16/19   [provider]  aspirin 81 MG chewable tablet Chew 81 mg by mouth daily. 08/07/19   [provider]   cyclobenzaprine (FLEXERIL) 10 MG tablet Take 1 tablet (10 mg total) by mouth 3 (three) times daily as needed for muscle spasms. 11/03/19   Dione Booze, MD  ENTRESTO 24-26 MG Take 1 tablet by mouth 2 (two) times daily. 10/13/19   [provider]  furosemide (LASIX) 20 MG tablet Take 20 mg by mouth 2 (two) times daily.  10/13/19   [provider]  gabapentin (NEURONTIN) 800 MG tablet Take 800 mg by mouth 3 (three) times daily as needed (for pain).  10/17/19   [provider]  HYDROcodone-acetaminophen (NORCO/VICODIN) 5-325 MG tablet Take 1 tablet by mouth every 6 (six) hours as needed. 12/07/21   Blue, Soijett A, PA-C  Insulin Glargine (BASAGLAR KWIKPEN) 100 UNIT/ML SOPN Inject 50 Units into the skin at bedtime. 09/20/19   [provider]  lidocaine (LIDODERM) 5 % Place 1 patch onto the skin daily. Remove & Discard patch within 12 hours or as directed by MD 08/18/20   Couture, Cortni S, PA-C  metFORMIN (GLUCOPHAGE) 1000 MG tablet Take 1,000 mg by mouth 2 (two) times daily. 10/13/19   [provider]  metoprolol tartrate (LOPRESSOR) 50 MG tablet Take 50 mg by mouth 2 (two) times daily.    [provider]  montelukast (SINGULAIR) 10 MG tablet Take 10 mg by mouth at bedtime as needed (for allergies).     [provider]  nitroGLYCERIN (  NITROSTAT) 0.4 MG SL tablet Place 0.4 mg under the tongue every 5 (five) minutes as needed for chest pain.     [provider]  psyllium (METAMUCIL SMOOTH TEXTURE) 58.6 % powder Take 1 packet by mouth 3 (three) times daily. 06/09/21   Margarita Grizzle, MD  ticagrelor (BRILINTA) 90 MG TABS tablet Take 90 mg by mouth 2 (two) times daily.    [provider]  colchicine 0.6 MG tablet Day 1: Oral: 1.2 mg at the first sign of flare, followed in 1 hour with a single dose of 0.6 mg Day 2 and thereafter: Oral: 0.6 mg once daily until 1-2 days after flare has resolved. Patient not taking: Reported on 11/03/2019  05/19/19 11/03/19  Petrucelli, Lelon Mast R, PA-C      Allergies    Other, Tramadol, Isosorbide nitrate, and Morphine and related    Review of Systems   Review of Systems  Physical Exam Updated Vital Signs BP (!) 147/88   Pulse 60   Temp 97.9 F (36.6 C) (Oral)   Resp 12   SpO2 95%  Physical Exam Constitutional:      General: He is not in acute distress. HENT:     Head: Normocephalic and atraumatic.  Eyes:     Conjunctiva/sclera: Conjunctivae normal.     Pupils: Pupils are equal, round, and reactive to light.  Cardiovascular:     Rate and Rhythm: Normal rate and regular rhythm.  Pulmonary:     Effort: Pulmonary effort is normal. No respiratory distress.  Abdominal:     General: There is no distension.     Tenderness: There is no abdominal tenderness.  Skin:    General: Skin is warm and dry.  Neurological:     General: No focal deficit present.     Mental Status: He is alert. Mental status is at baseline.  Psychiatric:        Mood and Affect: Mood normal.        Behavior: Behavior normal.    ED Results / Procedures / Treatments   Labs (all labs ordered are listed, but only abnormal results are displayed) Labs Reviewed  BASIC METABOLIC PANEL - Abnormal; Notable for the following components:      Result Value   Glucose, Bld 189 (*)    All other components within normal limits  CBC WITH DIFFERENTIAL/PLATELET - Abnormal; Notable for the following components:   Eosinophils Absolute 0.6 (*)    All other components within normal limits  URINALYSIS, ROUTINE W REFLEX MICROSCOPIC  TROPONIN I (HIGH SENSITIVITY)  TROPONIN I (HIGH SENSITIVITY)    EKG EKG Interpretation  Date/Time:  Friday January 06 2022 14:21:11 EST Ventricular Rate:  65 PR Interval:  154 QRS Duration: 74 QT Interval:  410 QTC Calculation: 426 R Axis:   -41 Text Interpretation: Normal sinus rhythm Left axis deviation Low voltage QRS Inferior infarct , age undetermined Anterolateral infarct , age  undetermined Abnormal ECG When compared with ECG of 09-Jun-2021 18:49, PREVIOUS ECG IS PRESENT Confirmed by Alvester Chou [phone]) on 01/06/2022 8:24:34 PM  Radiology DG Chest 2 View  Result Date: 01/06/2022 CLINICAL DATA:  Chest pain EXAM: CHEST - 2 VIEW COMPARISON:  06/09/2021 FINDINGS: The heart size and mediastinal contours are within normal limits. Both lungs are clear. The visualized skeletal structures are unremarkable. Pacemaker/defibrillator battery is seen in the left infraclavicular region. IMPRESSION: No active cardiopulmonary disease. Electronically Signed   By: Ernie Avena M.D.   On: 01/06/2022 15:25   CT Head  Wo Contrast  Result Date: 01/06/2022 CLINICAL DATA:  Left phase pain pain also along the left arm and left chest and back, beginning 2 days ago. EXAM: CT HEAD WITHOUT CONTRAST TECHNIQUE: Contiguous axial images were obtained from the base of the skull through the vertex without intravenous contrast. RADIATION DOSE REDUCTION: This exam was performed according to the departmental dose-optimization program which includes automated exposure control, adjustment of the mA and/or kV according to patient size and/or use of iterative reconstruction technique. COMPARISON:  None. FINDINGS: Brain: No evidence of acute infarction, hemorrhage, hydrocephalus, extra-axial collection or mass lesion/mass effect. Vascular: No hyperdense vessel or unexpected calcification. Skull: Normal. Negative for fracture or focal lesion. Sinuses/Orbits: Globes and orbits are unremarkable. Visualized sinuses are clear. Other: None. IMPRESSION: Normal unenhanced CT scan of the brain Electronically Signed   By: Amie Portland M.D.   On: 01/06/2022 16:30   DG Shoulder Left  Result Date: 01/06/2022 CLINICAL DATA:  Chest pain, left shoulder pain EXAM: LEFT SHOULDER - 2+ VIEW COMPARISON:  12/07/2021 FINDINGS: No fracture or dislocation is seen. There are no abnormal soft tissue calcifications. Pacemaker battery is seen  in the left infraclavicular region. No significant interval changes are noted. IMPRESSION: No significant radiographic abnormalities are seen in the left shoulder. Electronically Signed   By: Ernie Avena M.D.   On: 01/06/2022 15:28    Procedures Procedures    Medications Ordered in ED Medications  oxyCODONE-acetaminophen (PERCOCET/ROXICET) 5-325 MG per tablet 1 tablet (has no administration in time range)    ED Course/ Medical Decision Making/ A&P                           Medical Decision Making Risk Prescription drug management.   Patient is here with left-sided neck pain and facial pain, left-sided chest and back pain and left arm pain.  His symptoms are extremely reproducible with movement of the head and neck.  I suspect this likely related to his cervical radiculopathy or disc herniation.  He does not have evidence of cord compression in terms of objective weakness on exam.  He does demonstrate significant kyphosis of the spine, poor posture, which is likely the cause of his underlying spinal issues.  I will give him an office number for the [place] [facility].  We can offer him a short-term opioid prescription, 15 tablets, to assist mainly at sleep at night.  Also advised that he try the gabapentin which he is not using regularly, but advised not to take it at the same time as the opiate.  We will need to avoid NSAIDs and prednisone given his adverse reactions to each an anticoagulation status.  Otherwise the patient had a work-up done in the ER which I personally reviewed and interpreted.  Troponins are low and flat.  EKG shows irregular rhythm without acute ischemic findings per my interpretation.  CT head is unremarkable does not show signs of stroke.  A very low suspicion for pulmonary embolism or aortic dissection clinically.        Final Clinical Impression(s) / ED Diagnoses Final diagnoses:  Shoulder pain    Rx / DC Orders ED Discharge  Orders     None         Terald Sleeper, MD 01/06/22 2054

## 2022-01-06 NOTE — ED Provider Triage Note (Signed)
Emergency Medicine Provider Triage Evaluation Note ? ?Gerald Simpson , a 56 y.o. male  was evaluated in triage.  Pt complains of chest pain over the left clavicle, of the left jaw, and left upper extremity for the last 2 days.  Woke up this morning and stated his left arm felt cold to the touch, after he rubbed it to warm it up it got better.  Has not been able to sleep due to the pain.  ICD in place.  Hx of ischemic cardiomyopathy, STEMI (LAD), PSVT. Denies headache or vision changes. ? ?Review of Systems  ?Positive: As above ?Negative: As above ? ?Physical Exam  ?BP 122/90   Pulse 63   Temp 97.9 ?F (36.6 ?C) (Oral)   Resp 20   SpO2 99%  ?Gen:   Awake, no distress  ?Resp:  Normal effort, mild auditory wheeze ?MSK:   Moves extremities without difficulty with exception of left shoulder ?Other:  Grossly negative neuro exam.  When evaluating CN XI with shoulder shrug, patient appeared to have sudden significant pain and facial droop which resolved within 5 seconds.  Limited ROM of left shoulder due to tenderness.  Radial pulses 2+.  Capillary refill of upper extremities less than 2 sec.   ? ?Medical Decision Making  ?Medically screening exam initiated at 2:42 PM.  Appropriate orders placed.  Gerald Simpson was informed that the remainder of the evaluation will be completed by another provider, this initial triage assessment does not replace that evaluation, and the importance of remaining in the ED until their evaluation is complete. ? ?Labs, imaging, EKG ?  ?Gerald Rome, PA-C ?16/55/37 1458 ? ?

## 2022-06-19 ENCOUNTER — Emergency Department (HOSPITAL_BASED_OUTPATIENT_CLINIC_OR_DEPARTMENT_OTHER)
Admission: EM | Admit: 2022-06-19 | Discharge: 2022-06-19 | Disposition: A | Discharge status: Home / Self Care | Payer: Medicaid Other | Attending: Emergency Medicine | Admitting: Emergency Medicine

## 2022-06-19 ENCOUNTER — Other Ambulatory Visit: Payer: Self-pay

## 2022-06-19 ENCOUNTER — Encounter (HOSPITAL_BASED_OUTPATIENT_CLINIC_OR_DEPARTMENT_OTHER): Payer: Self-pay | Admitting: Emergency Medicine

## 2022-06-19 DIAGNOSIS — Z7982 Long term (current) use of aspirin: Secondary | ICD-10-CM | POA: Insufficient documentation

## 2022-06-19 DIAGNOSIS — Z79899 Other long term (current) drug therapy: Secondary | ICD-10-CM | POA: Diagnosis not present

## 2022-06-19 DIAGNOSIS — M109 Gout, unspecified: Secondary | ICD-10-CM | POA: Insufficient documentation

## 2022-06-19 DIAGNOSIS — M7989 Other specified soft tissue disorders: Secondary | ICD-10-CM | POA: Diagnosis present

## 2022-06-19 MED ORDER — KETOROLAC TROMETHAMINE 15 MG/ML IJ SOLN
15.0000 mg | Freq: Once | INTRAMUSCULAR | Status: AC
Start: 2022-06-19 — End: 2022-06-19
  Administered 2022-06-19: 15 mg via INTRAMUSCULAR
  Filled 2022-06-19: qty 1

## 2022-06-19 MED ORDER — HYDROCODONE-ACETAMINOPHEN 5-325 MG PO TABS
1.0000 | ORAL_TABLET | Freq: Once | ORAL | Status: AC
  Administered 2022-06-19: 1 via ORAL
  Filled 2022-06-19: qty 1

## 2022-06-19 MED ORDER — COLCHICINE 0.6 MG PO TABS
1.2000 mg | ORAL_TABLET | Freq: Once | ORAL | Status: AC
  Administered 2022-06-19: 1.2 mg via ORAL
  Filled 2022-06-19: qty 2

## 2022-06-19 MED ORDER — NAPROXEN 375 MG PO TABS: 375.0000 mg | ORAL_TABLET | Freq: Two times a day (BID) | ORAL | 0 refills | Status: DC

## 2022-06-19 MED ORDER — COLCHICINE 0.6 MG PO TABS: 0.6000 mg | ORAL_TABLET | Freq: Two times a day (BID) | ORAL | 0 refills | Status: DC

## 2022-06-19 MED ORDER — HYDROCODONE-ACETAMINOPHEN 5-325 MG PO TABS: 2.0000 | ORAL_TABLET | Freq: Four times a day (QID) | ORAL | 0 refills | Status: DC | PRN

## 2022-06-19 NOTE — ED Provider Notes (Signed)
[facility] EMERGENCY DEPT Provider Note   CSN: [id number] Arrival date & time: 06/19/22  1046     History  Chief Complaint  Patient presents with   Foot Swelling   Gout    Gerald Simpson is a 56 y.o. male.  56 year old male with past medical history significant for gout, diabetes presents today for evaluation of right foot and ankle pain going on for about 3 days.  He denies fever, chills.  States this is consistent with his prior episodes of gout.  He has not taken anything prior to arrival for this.  He does take allopurinol and reports compliance with this.  He states that is typical for him to get gout flare in multiple joints at the same time.  His primary areas of concern today or his right great toe, and right ankle.  States the slightest touch causes significant pain.  Reports minimal pain in his right knee but without the swelling, or warmth like in his right great toe, right ankle.  He states he was walking on the lateral aspect of his right foot the past couple days.  He denies injury to his right foot or ankle.  The history is provided by the patient. No language interpreter was used.       Home Medications Prior to Admission medications   Medication Sig Start Date End Date Taking? Authorizing Provider  acetaminophen (TYLENOL) 325 MG tablet Take 2 tablets (650 mg total) by mouth every 6 (six) hours as needed for up to 30 doses. 01/06/22   Wyvonnia Dusky, MD  ADMELOG SOLOSTAR 100 UNIT/ML KwikPen Inject 7 Units into the skin 3 (three) times daily between meals. 10/13/19   [provider]  albuterol (PROVENTIL) (2.5 MG/3ML) 0.083% nebulizer solution Take 2.5 mg by nebulization daily as needed for wheezing or shortness of breath.    [provider]  allopurinol (ZYLOPRIM) 100 MG tablet Take 100 mg by mouth daily. 10/16/19   [provider]  aspirin 81 MG chewable tablet Chew 81 mg by mouth daily. 08/07/19   [provider]   cyclobenzaprine (FLEXERIL) 10 MG tablet Take 1 tablet (10 mg total) by mouth 3 (three) times daily as needed for muscle spasms. 54/62/70   Delora Fuel, MD  ENTRESTO 24-26 MG Take 1 tablet by mouth 2 (two) times daily. 10/13/19   [provider]  furosemide (LASIX) 20 MG tablet Take 20 mg by mouth 2 (two) times daily.  10/13/19   [provider]  gabapentin (NEURONTIN) 800 MG tablet Take 800 mg by mouth 3 (three) times daily as needed (for pain).  10/17/19   [provider]  gabapentin (NEURONTIN) 800 MG tablet Take 1 tablet (800 mg total) by mouth 3 (three) times daily as needed for up to 21 doses. 01/06/22   Wyvonnia Dusky, MD  HYDROcodone-acetaminophen (NORCO/VICODIN) 5-325 MG tablet Take 1 tablet by mouth every 6 (six) hours as needed. 12/07/21   Blue, Soijett A, PA-C  Insulin Glargine (BASAGLAR KWIKPEN) 100 UNIT/ML SOPN Inject 50 Units into the skin at bedtime. 09/20/19   [provider]  lidocaine (LIDODERM) 5 % Place 1 patch onto the skin daily. Remove & Discard patch within 12 hours or as directed by MD 08/18/20   Couture, Cortni S, PA-C  metFORMIN (GLUCOPHAGE) 1000 MG tablet Take 1,000 mg by mouth 2 (two) times daily. 10/13/19   [provider]  metoprolol tartrate (LOPRESSOR) 50 MG tablet Take 50 mg by mouth 2 (two) times  daily.    [provider]  montelukast (SINGULAIR) 10 MG tablet Take 10 mg by mouth at bedtime as needed (for allergies).     [provider]  nitroGLYCERIN (NITROSTAT) 0.4 MG SL tablet Place 0.4 mg under the tongue every 5 (five) minutes as needed for chest pain.     [provider]  oxyCODONE (ROXICODONE) 5 MG immediate release tablet Take 1 tablet (5 mg total) by mouth every 8 (eight) hours as needed for up to 12 doses for severe pain. 01/06/22   Wyvonnia Dusky, MD  psyllium (METAMUCIL SMOOTH TEXTURE) 58.6 % powder Take 1 packet by mouth 3 (three) times daily. 06/09/21   Pattricia Boss, MD  ticagrelor  (BRILINTA) 90 MG TABS tablet Take 90 mg by mouth 2 (two) times daily.    [provider]  colchicine 0.6 MG tablet Day 1: Oral: 1.2 mg at the first sign of flare, followed in 1 hour with a single dose of 0.6 mg Day 2 and thereafter: Oral: 0.6 mg once daily until 1-2 days after flare has resolved. Patient not taking: Reported on 11/03/2019 05/19/19 11/03/19  Petrucelli, Aldona Bar R, PA-C      Allergies    Other, Tramadol, Isosorbide nitrate, and Morphine and related    Review of Systems   Review of Systems  Constitutional:  Negative for chills and fever.  Musculoskeletal:  Positive for arthralgias and joint swelling.  All other systems reviewed and are negative.   Physical Exam Updated Vital Signs BP (!) 115/96 (BP Location: Left Arm)   Pulse 62   Temp (!) 97.5 F (36.4 C)   Resp 18   Ht '5\' 10"'$  (1.778 m)   Wt 122.5 kg   SpO2 98%   BMI 38.74 kg/m  Physical Exam Vitals and nursing note reviewed.  Constitutional:      General: He is not in acute distress.    Appearance: Normal appearance. He is not ill-appearing.  HENT:     Head: Normocephalic and atraumatic.     Nose: Nose normal.  Eyes:     Conjunctiva/sclera: Conjunctivae normal.  Cardiovascular:     Rate and Rhythm: Normal rate and regular rhythm.  Pulmonary:     Effort: Pulmonary effort is normal. No respiratory distress.  Musculoskeletal:        General: No deformity.     Comments: Right knee with full range of motion, without tenderness to palpation.  No swelling in the right knee noted.  Without erythema or warmth.  Right ankle and foot with mild erythema, significant warmth noted to right great toe.  Exquisite tenderness to palpation present over the right ankle, and right great toe.   Skin:    Findings: No rash.  Neurological:     Mental Status: He is alert.     ED Results / Procedures / Treatments   Labs (all labs ordered are listed, but only abnormal results are displayed) Labs Reviewed - No data  to display  EKG None  Radiology No results found.  Procedures Procedures    Medications Ordered in ED Medications  HYDROcodone-acetaminophen (NORCO/VICODIN) 5-325 MG per tablet 1 tablet (has no administration in time range)  ketorolac (TORADOL) 15 MG/ML injection 15 mg (has no administration in time range)  colchicine tablet 1.2 mg (has no administration in time range)    ED Course/ Medical Decision Making/ A&P  Medical Decision Making Risk Prescription drug management.   56 year old male with prior history of gout, diabetes presents today for evaluation of right ankle pain, right great toe pain.  This has been ongoing for the past 3 days.  He states this is consistent with his prior gout flare.  He is afebrile.  He is diabetic and refuses steroids.  Will provide dose of colchicine of 1.2 mg in the emergency room.  Will provide dose of Toradol, and Norco for pain control. Reports improvement following the above regimen.  Will discharge with colchicine, and naproxen.  Return precautions discussed.  Patient voices understanding and is in agreement with plan.  Refusing steroids due to it causing his blood sugars to be uncontrolled and elevated.   Final Clinical Impression(s) / ED Diagnoses Final diagnoses:  Acute gout of multiple sites, unspecified cause    Rx / DC Orders ED Discharge Orders          Ordered    colchicine 0.6 MG tablet  2 times daily        06/19/22 1420    naproxen (NAPROSYN) 375 MG tablet  2 times daily        06/19/22 1420    HYDROcodone-acetaminophen (NORCO/VICODIN) 5-325 MG tablet  Every 6 hours PRN        06/19/22 1423              Evlyn Courier, PA-C 06/19/22 1423    Tegeler, Gwenyth Allegra, MD 06/19/22 1555

## 2022-06-19 NOTE — ED Triage Notes (Signed)
Pt arrives to ED via [facility] with c/o right foot, ankle, and knee swelling that started x3 days ago. The swelling started in his foot and has moved up his leg. Hx Gout.

## 2022-06-19 NOTE — ED Notes (Signed)
Discharge paperwork given and verbally understood. 

## 2022-06-19 NOTE — Discharge Instructions (Addendum)
Your exam today was consistent with gout.  You state this is similar to your prior episodes.  You received some medications in the emergency room with improvement.  I have sent colchicine, naproxen into the pharmacy for you.  If you have any worsening symptoms please return for evaluation otherwise follow-up with your primary care provider.

## 2022-08-04 ENCOUNTER — Observation Stay (HOSPITAL_COMMUNITY)
Admission: EM | Admit: 2022-08-04 | Discharge: 2022-08-06 | Disposition: A | Discharge status: Home / Self Care | Payer: Medicaid Other | Attending: Internal Medicine | Admitting: Internal Medicine

## 2022-08-04 ENCOUNTER — Emergency Department (HOSPITAL_COMMUNITY): Payer: Medicaid Other

## 2022-08-04 DIAGNOSIS — Z79899 Other long term (current) drug therapy: Secondary | ICD-10-CM | POA: Insufficient documentation

## 2022-08-04 DIAGNOSIS — I255 Ischemic cardiomyopathy: Secondary | ICD-10-CM | POA: Diagnosis present

## 2022-08-04 DIAGNOSIS — G8929 Other chronic pain: Secondary | ICD-10-CM | POA: Diagnosis present

## 2022-08-04 DIAGNOSIS — E785 Hyperlipidemia, unspecified: Secondary | ICD-10-CM | POA: Diagnosis present

## 2022-08-04 DIAGNOSIS — Z7902 Long term (current) use of antithrombotics/antiplatelets: Secondary | ICD-10-CM

## 2022-08-04 DIAGNOSIS — E872 Acidosis, unspecified: Secondary | ICD-10-CM | POA: Diagnosis present

## 2022-08-04 DIAGNOSIS — Z86711 Personal history of pulmonary embolism: Secondary | ICD-10-CM

## 2022-08-04 DIAGNOSIS — R109 Unspecified abdominal pain: Secondary | ICD-10-CM | POA: Diagnosis present

## 2022-08-04 DIAGNOSIS — Z7984 Long term (current) use of oral hypoglycemic drugs: Secondary | ICD-10-CM

## 2022-08-04 DIAGNOSIS — Z955 Presence of coronary angioplasty implant and graft: Secondary | ICD-10-CM

## 2022-08-04 DIAGNOSIS — E878 Other disorders of electrolyte and fluid balance, not elsewhere classified: Principal | ICD-10-CM | POA: Diagnosis present

## 2022-08-04 DIAGNOSIS — I110 Hypertensive heart disease with heart failure: Secondary | ICD-10-CM | POA: Diagnosis present

## 2022-08-04 DIAGNOSIS — I428 Other cardiomyopathies: Secondary | ICD-10-CM | POA: Insufficient documentation

## 2022-08-04 DIAGNOSIS — Z794 Long term (current) use of insulin: Secondary | ICD-10-CM | POA: Insufficient documentation

## 2022-08-04 DIAGNOSIS — Z7982 Long term (current) use of aspirin: Secondary | ICD-10-CM

## 2022-08-04 DIAGNOSIS — I5032 Chronic diastolic (congestive) heart failure: Secondary | ICD-10-CM | POA: Diagnosis not present

## 2022-08-04 DIAGNOSIS — I2510 Atherosclerotic heart disease of native coronary artery without angina pectoris: Secondary | ICD-10-CM | POA: Insufficient documentation

## 2022-08-04 DIAGNOSIS — A084 Viral intestinal infection, unspecified: Principal | ICD-10-CM | POA: Diagnosis present

## 2022-08-04 DIAGNOSIS — Z885 Allergy status to narcotic agent status: Secondary | ICD-10-CM

## 2022-08-04 DIAGNOSIS — M109 Gout, unspecified: Secondary | ICD-10-CM | POA: Insufficient documentation

## 2022-08-04 DIAGNOSIS — I252 Old myocardial infarction: Secondary | ICD-10-CM

## 2022-08-04 DIAGNOSIS — M545 Low back pain, unspecified: Secondary | ICD-10-CM | POA: Diagnosis present

## 2022-08-04 DIAGNOSIS — I5022 Chronic systolic (congestive) heart failure: Secondary | ICD-10-CM | POA: Diagnosis present

## 2022-08-04 DIAGNOSIS — E876 Hypokalemia: Secondary | ICD-10-CM | POA: Diagnosis present

## 2022-08-04 DIAGNOSIS — J302 Other seasonal allergic rhinitis: Secondary | ICD-10-CM | POA: Diagnosis present

## 2022-08-04 DIAGNOSIS — Z95810 Presence of automatic (implantable) cardiac defibrillator: Secondary | ICD-10-CM

## 2022-08-04 DIAGNOSIS — G4733 Obstructive sleep apnea (adult) (pediatric): Secondary | ICD-10-CM | POA: Diagnosis present

## 2022-08-04 DIAGNOSIS — E538 Deficiency of other specified B group vitamins: Secondary | ICD-10-CM | POA: Diagnosis present

## 2022-08-04 DIAGNOSIS — R7989 Other specified abnormal findings of blood chemistry: Secondary | ICD-10-CM

## 2022-08-04 DIAGNOSIS — E119 Type 2 diabetes mellitus without complications: Secondary | ICD-10-CM | POA: Diagnosis present

## 2022-08-04 DIAGNOSIS — R1013 Epigastric pain: Principal | ICD-10-CM

## 2022-08-04 DIAGNOSIS — Z720 Tobacco use: Secondary | ICD-10-CM

## 2022-08-04 DIAGNOSIS — Z888 Allergy status to other drugs, medicaments and biological substances status: Secondary | ICD-10-CM

## 2022-08-04 LAB — CBC WITH DIFFERENTIAL/PLATELET
Abs Immature Granulocytes: 0.05 10*3/uL (ref 0.00–0.07)
Basophils Absolute: 0 10*3/uL (ref 0.0–0.1)
Basophils Relative: 0 %
Eosinophils Absolute: 0.4 10*3/uL (ref 0.0–0.5)
Eosinophils Relative: 3 %
HCT: 45.5 % (ref 39.0–52.0)
Hemoglobin: 15.3 g/dL (ref 13.0–17.0)
Immature Granulocytes: 0 %
Lymphocytes Relative: 12 %
Lymphs Abs: 1.7 10*3/uL (ref 0.7–4.0)
MCH: 31.9 pg (ref 26.0–34.0)
MCHC: 33.6 g/dL (ref 30.0–36.0)
MCV: 95 fL (ref 80.0–100.0)
Monocytes Absolute: 0.7 10*3/uL (ref 0.1–1.0)
Monocytes Relative: 5 %
Neutro Abs: 10.9 10*3/uL — ABNORMAL HIGH (ref 1.7–7.7)
Neutrophils Relative %: 80 %
Platelets: 275 10*3/uL (ref 150–400)
RBC: 4.79 MIL/uL (ref 4.22–5.81)
RDW: 13.2 % (ref 11.5–15.5)
WBC: 13.9 10*3/uL — ABNORMAL HIGH (ref 4.0–10.5)
nRBC: 0 % (ref 0.0–0.2)

## 2022-08-04 LAB — I-STAT CHEM 8, ED
BUN: 11 mg/dL (ref 6–20)
Calcium, Ion: 0.9 mmol/L — ABNORMAL LOW (ref 1.15–1.40)
Chloride: 101 mmol/L (ref 98–111)
Creatinine, Ser: 1.2 mg/dL (ref 0.61–1.24)
Glucose, Bld: 121 mg/dL — ABNORMAL HIGH (ref 70–99)
HCT: 45 % (ref 39.0–52.0)
Hemoglobin: 15.3 g/dL (ref 13.0–17.0)
Potassium: 3 mmol/L — ABNORMAL LOW (ref 3.5–5.1)
Sodium: 137 mmol/L (ref 135–145)
TCO2: 22 mmol/L (ref 22–32)

## 2022-08-04 MED ORDER — ONDANSETRON HCL 4 MG/2ML IJ SOLN
4.0000 mg | Freq: Once | INTRAMUSCULAR | Status: AC
Start: 2022-08-04 — End: 2022-08-04
  Administered 2022-08-04: 4 mg via INTRAVENOUS
  Filled 2022-08-04: qty 2

## 2022-08-04 MED ORDER — FENTANYL CITRATE PF 50 MCG/ML IJ SOSY
50.0000 ug | PREFILLED_SYRINGE | Freq: Once | INTRAMUSCULAR | Status: AC
  Administered 2022-08-04: 50 ug via INTRAVENOUS
  Filled 2022-08-04: qty 1

## 2022-08-04 NOTE — ED Triage Notes (Signed)
BIB [facility] from home. Called EMS due to +N+V. EMS described pt as diaphoretic and passing out. No fall reported. Multiple other complaints. Hx multiple MI's.

## 2022-08-04 NOTE — ED Provider Notes (Signed)
[facility] EMERGENCY DEPARTMENT Provider Note   CSN: [id number] Arrival date & time: 08/04/22  2321     History {Add pertinent medical, surgical, social history, OB history to HPI:1} Chief Complaint  Patient presents with   Abdominal Pain    Gerald Simpson is a 56 y.o. male.  The history is provided by the patient, the EMS personnel and medical records.  Abdominal Pain Gerald Simpson is a 56 y.o. male who presents to the Emergency Department complaining of abdominal pain.  He presents to the emergency department by EMS for evaluation of severe left upper quadrant abdominal pain that started about 30 minutes to 1 hour prior to ED presentation.  Pain is described as stabbing and radiates to his back.  He has associated numerous episodes of emesis.  No diarrhea but he states he feels it coming.  No fevers.  He does have pain on breathing.  He has had similar pain in the past that has been secondary to both MI and pancreatitis.  He states this pain feels more severe than those episodes.  He takes Xarelto.  He has associated severe diaphoresis.     Home Medications Prior to Admission medications   Medication Sig Start Date End Date Taking? Authorizing Provider  ADMELOG SOLOSTAR 100 UNIT/ML KwikPen Inject 7 Units into the skin 3 (three) times daily between meals. 10/13/19   [provider]  albuterol (PROVENTIL) (2.5 MG/3ML) 0.083% nebulizer solution Take 2.5 mg by nebulization daily as needed for wheezing or shortness of breath.    [provider]  allopurinol (ZYLOPRIM) 100 MG tablet Take 100 mg by mouth daily. 10/16/19   [provider]  aspirin 81 MG chewable tablet Chew 81 mg by mouth daily. 08/07/19   [provider]  colchicine 0.6 MG tablet Take 1 tablet (0.6 mg total) by mouth 2 (two) times daily. 06/19/22   Evlyn Courier, PA-C  cyclobenzaprine (FLEXERIL) 10 MG tablet Take 1 tablet (10 mg total) by mouth 3 (three) times daily as needed  for muscle spasms. 92/42/68   Delora Fuel, MD  ENTRESTO 24-26 MG Take 1 tablet by mouth 2 (two) times daily. 10/13/19   [provider]  furosemide (LASIX) 20 MG tablet Take 20 mg by mouth 2 (two) times daily.  10/13/19   [provider]  gabapentin (NEURONTIN) 800 MG tablet Take 800 mg by mouth 3 (three) times daily as needed (for pain).  10/17/19   [provider]  gabapentin (NEURONTIN) 800 MG tablet Take 1 tablet (800 mg total) by mouth 3 (three) times daily as needed for up to 21 doses. 01/06/22   Wyvonnia Dusky, MD  HYDROcodone-acetaminophen (NORCO/VICODIN) 5-325 MG tablet Take 2 tablets by mouth every 6 (six) hours as needed for up to 8 doses. 06/19/22   Evlyn Courier, PA-C  Insulin Glargine (BASAGLAR KWIKPEN) 100 UNIT/ML SOPN Inject 50 Units into the skin at bedtime. 09/20/19   [provider]  lidocaine (LIDODERM) 5 % Place 1 patch onto the skin daily. Remove & Discard patch within 12 hours or as directed by MD 08/18/20   Couture, Cortni S, PA-C  metFORMIN (GLUCOPHAGE) 1000 MG tablet Take 1,000 mg by mouth 2 (two) times daily. 10/13/19   [provider]  metoprolol tartrate (LOPRESSOR) 50 MG tablet Take 50 mg by mouth 2 (two) times daily.    [provider]  montelukast (SINGULAIR) 10 MG tablet Take 10 mg by mouth at bedtime as needed (for allergies).  [provider]  naproxen (NAPROSYN) 375 MG tablet Take 1 tablet (375 mg total) by mouth 2 (two) times daily. 06/19/22   Evlyn Courier, PA-C  nitroGLYCERIN (NITROSTAT) 0.4 MG SL tablet Place 0.4 mg under the tongue every 5 (five) minutes as needed for chest pain.     [provider]  psyllium (METAMUCIL SMOOTH TEXTURE) 58.6 % powder Take 1 packet by mouth 3 (three) times daily. 06/09/21   Pattricia Boss, MD  ticagrelor (BRILINTA) 90 MG TABS tablet Take 90 mg by mouth 2 (two) times daily.    [provider]      Allergies    Other, Tramadol, Isosorbide nitrate, and  Morphine and related    Review of Systems   Review of Systems  Gastrointestinal:  Positive for abdominal pain.  All other systems reviewed and are negative.   Physical Exam Updated Vital Signs There were no vitals taken for this visit. Physical Exam Vitals and nursing note reviewed.  Constitutional:      Appearance: He is well-developed.  HENT:     Head: Normocephalic and atraumatic.  Cardiovascular:     Rate and Rhythm: Normal rate and regular rhythm.     Heart sounds: No murmur heard. Pulmonary:     Effort: Pulmonary effort is normal. No respiratory distress.     Comments: Crackles in the right lung base.  Decreased air movement in the left lung base Abdominal:     Palpations: Abdomen is soft.     Tenderness: There is abdominal tenderness. There is no guarding or rebound.     Comments: Significant left upper quadrant tenderness to palpation with voluntary guarding.  Mild lower abdominal and right upper quadrant tenderness  Musculoskeletal:     Comments: 2+ DP pulses bilaterally.  There is tenderness to palpation over the right great toe.  Skin:    General: Skin is warm and dry.  Neurological:     Mental Status: He is alert and oriented to person, place, and time.  Psychiatric:        Behavior: Behavior normal.     ED Results / Procedures / Treatments   Labs (all labs ordered are listed, but only abnormal results are displayed) Labs Reviewed  COMPREHENSIVE METABOLIC PANEL  LIPASE, BLOOD  LACTIC ACID, PLASMA  LACTIC ACID, PLASMA  CBC WITH DIFFERENTIAL/PLATELET  I-STAT CHEM 8, ED  TROPONIN I (HIGH SENSITIVITY)    EKG None  Radiology No results found.  Procedures Procedures  {Document cardiac monitor, telemetry assessment procedure when appropriate:1}  Medications Ordered in ED Medications  fentaNYL (SUBLIMAZE) injection 50 mcg (has no administration in time range)  ondansetron (ZOFRAN) injection 4 mg (has no administration in time range)    ED Course/  Medical Decision Making/ A&P                           Medical Decision Making Amount and/or Complexity of Data Reviewed Labs: ordered. Radiology: ordered.  Risk Prescription drug management.   ***  {Document critical care time when appropriate:1} {Document review of labs and clinical decision tools ie heart score, Chads2Vasc2 etc:1}  {Document your independent review of radiology images, and any outside records:1} {Document your discussion with family members, caretakers, and with consultants:1} {Document social determinants of health affecting pt's care:1} {Document your decision making why or why not admission, treatments were needed:1} Final Clinical Impression(s) / ED Diagnoses Final diagnoses:  None    Rx / DC Orders ED Discharge  Orders     None

## 2022-08-05 ENCOUNTER — Emergency Department (HOSPITAL_COMMUNITY): Payer: Medicaid Other

## 2022-08-05 DIAGNOSIS — G8929 Other chronic pain: Secondary | ICD-10-CM | POA: Diagnosis present

## 2022-08-05 DIAGNOSIS — A084 Viral intestinal infection, unspecified: Secondary | ICD-10-CM | POA: Diagnosis present

## 2022-08-05 DIAGNOSIS — Z794 Long term (current) use of insulin: Secondary | ICD-10-CM | POA: Diagnosis not present

## 2022-08-05 DIAGNOSIS — Z888 Allergy status to other drugs, medicaments and biological substances status: Secondary | ICD-10-CM | POA: Diagnosis not present

## 2022-08-05 DIAGNOSIS — J302 Other seasonal allergic rhinitis: Secondary | ICD-10-CM | POA: Diagnosis present

## 2022-08-05 DIAGNOSIS — E872 Acidosis, unspecified: Secondary | ICD-10-CM | POA: Diagnosis present

## 2022-08-05 DIAGNOSIS — I110 Hypertensive heart disease with heart failure: Secondary | ICD-10-CM | POA: Diagnosis present

## 2022-08-05 DIAGNOSIS — I2510 Atherosclerotic heart disease of native coronary artery without angina pectoris: Secondary | ICD-10-CM | POA: Diagnosis present

## 2022-08-05 DIAGNOSIS — M109 Gout, unspecified: Secondary | ICD-10-CM | POA: Diagnosis present

## 2022-08-05 DIAGNOSIS — Z7984 Long term (current) use of oral hypoglycemic drugs: Secondary | ICD-10-CM | POA: Diagnosis not present

## 2022-08-05 DIAGNOSIS — E785 Hyperlipidemia, unspecified: Secondary | ICD-10-CM | POA: Diagnosis present

## 2022-08-05 DIAGNOSIS — I5022 Chronic systolic (congestive) heart failure: Secondary | ICD-10-CM | POA: Diagnosis present

## 2022-08-05 DIAGNOSIS — E878 Other disorders of electrolyte and fluid balance, not elsewhere classified: Principal | ICD-10-CM | POA: Diagnosis present

## 2022-08-05 DIAGNOSIS — Z720 Tobacco use: Secondary | ICD-10-CM | POA: Diagnosis not present

## 2022-08-05 DIAGNOSIS — Z7982 Long term (current) use of aspirin: Secondary | ICD-10-CM | POA: Diagnosis not present

## 2022-08-05 DIAGNOSIS — M545 Low back pain, unspecified: Secondary | ICD-10-CM | POA: Diagnosis present

## 2022-08-05 DIAGNOSIS — Z79899 Other long term (current) drug therapy: Secondary | ICD-10-CM | POA: Diagnosis not present

## 2022-08-05 DIAGNOSIS — Z885 Allergy status to narcotic agent status: Secondary | ICD-10-CM | POA: Diagnosis not present

## 2022-08-05 DIAGNOSIS — K529 Noninfective gastroenteritis and colitis, unspecified: Secondary | ICD-10-CM | POA: Diagnosis present

## 2022-08-05 DIAGNOSIS — Z7902 Long term (current) use of antithrombotics/antiplatelets: Secondary | ICD-10-CM | POA: Diagnosis not present

## 2022-08-05 DIAGNOSIS — G4733 Obstructive sleep apnea (adult) (pediatric): Secondary | ICD-10-CM | POA: Diagnosis present

## 2022-08-05 DIAGNOSIS — Z955 Presence of coronary angioplasty implant and graft: Secondary | ICD-10-CM | POA: Diagnosis not present

## 2022-08-05 DIAGNOSIS — E119 Type 2 diabetes mellitus without complications: Secondary | ICD-10-CM | POA: Diagnosis present

## 2022-08-05 DIAGNOSIS — I255 Ischemic cardiomyopathy: Secondary | ICD-10-CM | POA: Diagnosis present

## 2022-08-05 DIAGNOSIS — E538 Deficiency of other specified B group vitamins: Secondary | ICD-10-CM | POA: Diagnosis present

## 2022-08-05 DIAGNOSIS — I252 Old myocardial infarction: Secondary | ICD-10-CM | POA: Diagnosis not present

## 2022-08-05 LAB — TROPONIN I (HIGH SENSITIVITY)
Troponin I (High Sensitivity): 11 ng/L (ref <18)
Troponin I (High Sensitivity): 11 ng/L (ref <18)

## 2022-08-05 LAB — COMPREHENSIVE METABOLIC PANEL
ALT: 20 U/L (ref 0–44)
AST: 22 U/L (ref 15–41)
Albumin: 3.8 g/dL (ref 3.5–5.0)
Alkaline Phosphatase: 44 U/L (ref 38–126)
Anion gap: 16 — ABNORMAL HIGH (ref 5–15)
BUN: 10 mg/dL (ref 6–20)
CO2: 20 mmol/L — ABNORMAL LOW (ref 22–32)
Calcium: 8.1 mg/dL — ABNORMAL LOW (ref 8.9–10.3)
Chloride: 101 mmol/L (ref 98–111)
Creatinine, Ser: 1.21 mg/dL (ref 0.61–1.24)
GFR, Estimated: >60 mL/min (ref >60)
Glucose, Bld: 122 mg/dL — ABNORMAL HIGH (ref 70–99)
Potassium: 3 mmol/L — ABNORMAL LOW (ref 3.5–5.1)
Sodium: 137 mmol/L (ref 135–145)
Total Bilirubin: 0.5 mg/dL (ref 0.3–1.2)
Total Protein: 7 g/dL (ref 6.5–8.1)

## 2022-08-05 LAB — HEMOGLOBIN A1C
Hgb A1c MFr Bld: 5.9 % — ABNORMAL HIGH (ref 4.8–5.6)
Mean Plasma Glucose: 122.63 mg/dL

## 2022-08-05 LAB — URINALYSIS, COMPLETE (UACMP) WITH MICROSCOPIC
Bacteria, UA: NONE SEEN
Bilirubin Urine: NEGATIVE
Glucose, UA: 150 mg/dL — AB
Hgb urine dipstick: NEGATIVE
Ketones, ur: NEGATIVE mg/dL
Leukocytes,Ua: NEGATIVE
Nitrite: NEGATIVE
Protein, ur: NEGATIVE mg/dL
Specific Gravity, Urine: 1.032 — ABNORMAL HIGH (ref 1.005–1.030)
pH: 5 (ref 5.0–8.0)

## 2022-08-05 LAB — LIPASE, BLOOD: Lipase: 31 U/L (ref 11–51)

## 2022-08-05 LAB — LACTIC ACID, PLASMA
Lactic Acid, Venous: 2.2 mmol/L (ref 0.5–1.9)
Lactic Acid, Venous: 2.5 mmol/L (ref 0.5–1.9)
Lactic Acid, Venous: 2.8 mmol/L (ref 0.5–1.9)
Lactic Acid, Venous: 3.3 mmol/L (ref 0.5–1.9)
Lactic Acid, Venous: 3.6 mmol/L (ref 0.5–1.9)

## 2022-08-05 LAB — CBG MONITORING, ED
Glucose-Capillary: 253 mg/dL — ABNORMAL HIGH (ref 70–99)
Glucose-Capillary: 193 mg/dL — ABNORMAL HIGH (ref 70–99)

## 2022-08-05 LAB — MAGNESIUM: Magnesium: 1.1 mg/dL — ABNORMAL LOW (ref 1.7–2.4)

## 2022-08-05 LAB — HIV ANTIBODY (ROUTINE TESTING W REFLEX): HIV Screen 4th Generation wRfx: NONREACTIVE

## 2022-08-05 LAB — BRAIN NATRIURETIC PEPTIDE: B Natriuretic Peptide: 136.9 pg/mL — ABNORMAL HIGH (ref 0.0–100.0)

## 2022-08-05 LAB — CK: Total CK: 113 U/L (ref 49–397)

## 2022-08-05 MED ORDER — ATORVASTATIN CALCIUM 40 MG PO TABS
40.0000 mg | ORAL_TABLET | Freq: Every day | ORAL | Status: DC
  Administered 2022-08-05 – 2022-08-06 (×2): 40 mg via ORAL
  Filled 2022-08-05 (×2): qty 1

## 2022-08-05 MED ORDER — ENOXAPARIN SODIUM 40 MG/0.4ML IJ SOSY
40.0000 mg | PREFILLED_SYRINGE | INTRAMUSCULAR | Status: DC
  Administered 2022-08-05 – 2022-08-06 (×2): 40 mg via SUBCUTANEOUS
  Filled 2022-08-05 (×2): qty 0.4

## 2022-08-05 MED ORDER — MAGNESIUM SULFATE 2 GM/50ML IV SOLN
2.0000 g | Freq: Once | INTRAVENOUS | Status: AC
  Administered 2022-08-05: 2 g via INTRAVENOUS
  Filled 2022-08-05: qty 50

## 2022-08-05 MED ORDER — LACTATED RINGERS IV SOLN: INTRAVENOUS | Status: AC

## 2022-08-05 MED ORDER — SODIUM CHLORIDE 0.9 % IV BOLUS
500.0000 mL | Freq: Once | INTRAVENOUS | Status: AC
  Administered 2022-08-05: 500 mL via INTRAVENOUS

## 2022-08-05 MED ORDER — IPRATROPIUM-ALBUTEROL 0.5-2.5 (3) MG/3ML IN SOLN: 3.0000 mL | Freq: Four times a day (QID) | RESPIRATORY_TRACT | Status: DC | PRN

## 2022-08-05 MED ORDER — OXYCODONE-ACETAMINOPHEN 5-325 MG PO TABS
1.0000 | ORAL_TABLET | Freq: Once | ORAL | Status: AC
  Administered 2022-08-05: 1 via ORAL
  Filled 2022-08-05: qty 1

## 2022-08-05 MED ORDER — METOPROLOL TARTRATE 50 MG PO TABS
50.0000 mg | ORAL_TABLET | Freq: Two times a day (BID) | ORAL | Status: DC
  Administered 2022-08-05 – 2022-08-06 (×2): 50 mg via ORAL
  Filled 2022-08-05 (×2): qty 1

## 2022-08-05 MED ORDER — LIDOCAINE 5 % EX PTCH
1.0000 | MEDICATED_PATCH | CUTANEOUS | Status: DC
  Administered 2022-08-05: 1 via TRANSDERMAL
  Filled 2022-08-05: qty 1

## 2022-08-05 MED ORDER — SPIRONOLACTONE 25 MG PO TABS
25.0000 mg | ORAL_TABLET | Freq: Every day | ORAL | Status: DC
  Administered 2022-08-06: 25 mg via ORAL
  Filled 2022-08-05: qty 1

## 2022-08-05 MED ORDER — POTASSIUM CHLORIDE CRYS ER 20 MEQ PO TBCR
40.0000 meq | EXTENDED_RELEASE_TABLET | Freq: Once | ORAL | Status: AC
Start: 2022-08-05 — End: 2022-08-05
  Administered 2022-08-05: 40 meq via ORAL
  Filled 2022-08-05: qty 2

## 2022-08-05 MED ORDER — POTASSIUM CHLORIDE CRYS ER 20 MEQ PO TBCR
40.0000 meq | EXTENDED_RELEASE_TABLET | Freq: Once | ORAL | Status: AC
  Administered 2022-08-05: 40 meq via ORAL
  Filled 2022-08-05: qty 2

## 2022-08-05 MED ORDER — IOHEXOL 350 MG/ML SOLN
100.0000 mL | Freq: Once | INTRAVENOUS | Status: AC | PRN
  Administered 2022-08-05: 100 mL via INTRAVENOUS

## 2022-08-05 MED ORDER — LACTATED RINGERS IV SOLN: INTRAVENOUS | Status: DC

## 2022-08-05 MED ORDER — OXYCODONE-ACETAMINOPHEN 5-325 MG PO TABS: 1.0000 | ORAL_TABLET | Freq: Four times a day (QID) | ORAL | Status: DC | PRN

## 2022-08-05 MED ORDER — OXYCODONE-ACETAMINOPHEN 5-325 MG PO TABS: 1.0000 | ORAL_TABLET | Freq: Once | ORAL | Status: DC

## 2022-08-05 MED ORDER — COLCHICINE 0.6 MG PO TABS
1.2000 mg | ORAL_TABLET | Freq: Once | ORAL | Status: AC
  Administered 2022-08-05: 1.2 mg via ORAL
  Filled 2022-08-05: qty 2

## 2022-08-05 MED ORDER — TICAGRELOR 60 MG PO TABS
60.0000 mg | ORAL_TABLET | Freq: Two times a day (BID) | ORAL | Status: DC
  Administered 2022-08-05 – 2022-08-06 (×3): 60 mg via ORAL
  Filled 2022-08-05 (×4): qty 1

## 2022-08-05 MED ORDER — ACETAMINOPHEN 325 MG PO TABS: 650.0000 mg | ORAL_TABLET | Freq: Four times a day (QID) | ORAL | Status: DC | PRN

## 2022-08-05 MED ORDER — ONDANSETRON HCL 4 MG/2ML IJ SOLN: 4.0000 mg | Freq: Three times a day (TID) | INTRAMUSCULAR | Status: DC | PRN

## 2022-08-05 MED ORDER — SACUBITRIL-VALSARTAN 24-26 MG PO TABS
1.0000 | ORAL_TABLET | Freq: Two times a day (BID) | ORAL | Status: DC
  Administered 2022-08-06: 1 via ORAL
  Filled 2022-08-05: qty 1

## 2022-08-05 MED ORDER — NICOTINE 21 MG/24HR TD PT24
21.0000 mg | MEDICATED_PATCH | Freq: Every day | TRANSDERMAL | Status: DC
  Administered 2022-08-05 – 2022-08-06 (×2): 21 mg via TRANSDERMAL
  Filled 2022-08-05 (×2): qty 1

## 2022-08-05 MED ORDER — METHYLPREDNISOLONE SODIUM SUCC 125 MG IJ SOLR
125.0000 mg | Freq: Once | INTRAMUSCULAR | Status: AC
  Administered 2022-08-05: 125 mg via INTRAVENOUS
  Filled 2022-08-05: qty 2

## 2022-08-05 MED ORDER — OXYCODONE-ACETAMINOPHEN 5-325 MG PO TABS
1.0000 | ORAL_TABLET | ORAL | Status: DC | PRN
  Administered 2022-08-05 – 2022-08-06 (×3): 1 via ORAL
  Filled 2022-08-05 (×3): qty 1

## 2022-08-05 MED ORDER — INSULIN ASPART 100 UNIT/ML IJ SOLN
0.0000 [IU] | Freq: Three times a day (TID) | INTRAMUSCULAR | Status: DC
  Administered 2022-08-05: 2 [IU] via SUBCUTANEOUS
  Administered 2022-08-05: 5 [IU] via SUBCUTANEOUS
  Administered 2022-08-06: 1 [IU] via SUBCUTANEOUS

## 2022-08-05 MED ORDER — METHOCARBAMOL 500 MG PO TABS
1000.0000 mg | ORAL_TABLET | Freq: Three times a day (TID) | ORAL | Status: DC | PRN
  Administered 2022-08-05 – 2022-08-06 (×3): 1000 mg via ORAL
  Filled 2022-08-05 (×3): qty 2

## 2022-08-05 MED ORDER — COLCHICINE 0.6 MG PO TABS
0.6000 mg | ORAL_TABLET | Freq: Every day | ORAL | Status: DC
  Administered 2022-08-05 – 2022-08-06 (×2): 0.6 mg via ORAL
  Filled 2022-08-05 (×2): qty 1

## 2022-08-05 MED ORDER — LACTATED RINGERS IV BOLUS
500.0000 mL | Freq: Once | INTRAVENOUS | Status: AC
  Administered 2022-08-05: 500 mL via INTRAVENOUS

## 2022-08-05 MED ORDER — KETOROLAC TROMETHAMINE 60 MG/2ML IM SOLN
30.0000 mg | Freq: Once | INTRAMUSCULAR | Status: AC
  Administered 2022-08-05: 30 mg via INTRAMUSCULAR
  Filled 2022-08-05: qty 2

## 2022-08-05 MED ORDER — FUROSEMIDE 20 MG PO TABS
20.0000 mg | ORAL_TABLET | Freq: Every day | ORAL | Status: DC
  Administered 2022-08-06: 20 mg via ORAL
  Filled 2022-08-05: qty 1

## 2022-08-05 NOTE — ED Notes (Signed)
Pt refusing tele monitoring, states "I'm allergic to the adhesive"; risks of not being tele monitored explained to pt, pt a and o x 4, verbalized understanding, still refuses tele monitoring; will continue to monitor as able

## 2022-08-05 NOTE — ED Notes (Signed)
Pt refusing all monitoring (BP, pulse ox, cardiac); pt again reminded of the risks of not being on monitor; pt a and o x 4, verbalized understanding, still refusing monitoring devices

## 2022-08-05 NOTE — ED Notes (Signed)
Pt ambulatory to and from restroom with steady gait 

## 2022-08-05 NOTE — ED Notes (Signed)
Given crackers and juice with PO meds. Tolerates well.

## 2022-08-05 NOTE — ED Notes (Signed)
Ambulated pt to the restroom and back, The pt was experiencing pain on his left foot related to gout. However pt was still successful in ambulation.

## 2022-08-05 NOTE — Progress Notes (Signed)
Subjective: Patient complains of severe lower back pain.  He says it's midline.  It occasionally radiates down both legs.  It is painful to touch.  It is exacerbated by lying flat on his back, especially on the emergency department stretcher.  History notable for chronic low back pain for which he has received treatment at a pain clinic in the past.  Back pain today feels like an acute exacerbation of this chronic problem.  Objective: Exam is notable for pain out of proportion to examination findings on palpation of lower back, estimated around L4-L5 area.  Severe paraspinal muscle tenderness.  Some paraspinal muscle tightness, worse on the right.  Tapping of the spine provokes a "shooting" pain.  No overlying erythema or swelling.  No gross deformities palpable.  Assessment and plan: 56 year old male with history of chronic back pain complains of an acute exacerbation of his chronic back pain due to prolonged time spent laying on the ED stretcher.  No red flag findings on exam.  Will treat his pain with combination of topical and systemic therapies. - Oxycodone-acetaminophen 5-325 mg p.o. for 1 dose - Start methocarbamol 1000 mg p.o. every 8 hours as needed for back muscle spasms - Lidocaine patch

## 2022-08-05 NOTE — H&P (Addendum)
Date: 08/05/2022               Patient Name:  Gerald Simpson MRN: [id number]  DOB: 10/13/1966 Age / Sex: 56 y.o., male   PCP: Patient, No Pcp Per         Medical Service: Internal Medicine Teaching Service         Attending Physician: Dr. Charise Killian, MD    First Contact: Nani Gasser, MD  Pager: MM [phone]   Second Contact: Lorine Bears  Pager: Liliane Shi [phone]       After Hours (After 5p/  First Contact Pager: [phone]  weekends / holidays): Second Contact Pager: [phone]   SUBJECTIVE   Chief Complaint: Right lower extremity pain & abdominal pain with n/v/d  History of Present Illness:   Gerald Simpson is a 56 y/o person livign with a history of CAD s/p PCI x4 in [facility], HFrEF, bradycardia s/p pacemaker placement, HTN, HLD, T2DM, gout, seasonal allergies, and OSA brought in by EMS after having an episode of diaphoresis, nausea, vomiting with worsening right lower extremity pain.   The patient has been in his usual state of health until 5 days ago when he started having pain in his right heel. He noticed the area was red and painful to walk on, so he put his walking boot on that he has for hx of ankle fracture. This continued to worsen and the pain moved into his left big toe and then the subsequent toes over the following days. The pain is similar to gout flares he has had in the past.  He states that he was told not to take allopurinol as it can cause gout flares.  Believe that this was a miscommunication and that he was instructed to not take allopurinol during flares but to continue after it has resolved.  Yesterday evening after having a meal at a fast food restaurant he began having nausea, multiple episodes of nonbloody emesis and left upper abdominal pain.  He had associated symptoms of diaphoresis.  He has a history of upper quadrant pain that resolves with emesis, this has been going on for the past year or so.  The pain is located in his left upper quadrant and radiates into  his back.  He has been evaluated by his doctors in [facility] for this, he states he has had multiple ultrasounds and CT imaging done without any evidence as to the cause.  This episode did not resolve on its own the patient called 911 as during his prior MIs he had no chest pain but only nausea vomiting and diaphoresis.  Once he arrived to the ED his vomiting subsided but he did have multiple episodes of nonbloody diarrhea that eventually improved and has had formed stools this morning.  He denies any other family members feeling ill that ate at the fast food restaurant.  He denies any fevers, chest pain, shortness of breath, difficulty urinating.  His abdominal pain has resolved at this time.  Meds:  Aspirin 81 mg daily Brilinta 60 mg BID Entresto 50 daily Spironolactone 25 mg daily Lasix 20 daily Lipitor 40 mg daily Lopressor 50 mg BID Metformin Claritin Singulair  Past Medical History CAD s/p PCI with 4 stents placed when he was 74-41 y/o. HFrEF Gout HTN HLD T2DM OSA Nocturnal bradycardia status post pacemaker Seasonal allergies Vit B12 deficiency   Past Surgical History:  Procedure Laterality Date   CARDIAC CATHETERIZATION  06/27/2012   Moderate luminal irregularities in the LAD, 80-90%  stenosis in distal portion of LAD, moderate disease in second diagonal artery, medical therapy   CARDIAC CATHETERIZATION  12/30/2011   3.75x24 Promus Element DES stent, postdilation with 3.75x20 noncompliant Quantum, occlusion of apical portion of LAD, PTCA done 2.5 balloon, vessel too small to stent   [place]  2005   EYE SURGERY     HERNIA REPAIR     LEFT HEART CATHETERIZATION WITH CORONARY ANGIOGRAM N/A 12/30/2011   Procedure: LEFT HEART CATHETERIZATION WITH CORONARY ANGIOGRAM;  Surgeon: Troy Sine, MD;  Location: [facility] CATH LAB;  Service: Cardiovascular;  Laterality: N/A;   LEFT HEART CATHETERIZATION WITH CORONARY  ANGIOGRAM N/A 06/27/2012   Procedure: LEFT HEART CATHETERIZATION WITH CORONARY ANGIOGRAM;  Surgeon: Sanda Klein, MD;  Location: [facility] CATH LAB;  Service: Cardiovascular;  Laterality: N/A;   PACEMAKER INSERTION  2005   Dr Claiborne Billings  is present Cardiologist    TONSILLECTOMY      Social:  From [facility] but visiting for the area Occupation: On disability but used to work in [place]: Family in the area Level of Function: Able to perform IADLs/ADLs PCP: Located in [place]: smokeless tobacco, 2 cans/day for the past 40 years and uses occasional marijuana.  Denies alcohol or other substance use  Family History:  Father with history of colon cancer diagnosed in his 60s.  Allergies: Allergies as of 08/04/2022 - Review Complete 08/04/2022  Allergen Reaction Noted   Other Other (See Comments) 04/06/2012   Tramadol Other (See Comments) 03/18/2013   Isosorbide nitrate Other (See Comments) 03/18/2013   Morphine and related Other (See Comments) 01/09/2012    Review of Systems: A complete ROS was negative except as per HPI.   OBJECTIVE:   Physical Exam: Blood pressure 108/69, pulse 63, temperature (!) 97.5 F (36.4 C), temperature source Oral, resp. rate 16, height _0  (1.778 m), weight 122.5 kg, SpO2 99 %.  Constitutional: No acute distress, well-appearing HENT: normocephalic atraumatic, mucous membranes dry Eyes: conjunctiva non-erythematous. PERRL Neck: supple Cardiovascular: Distant heart sounds but regular rate and rhythm. No murmurs, gallops, rubs Pulmonary/Chest: normal work of breathing on room air, expiratory wheezing. No rales or rhonchi Abdominal: soft, non-distended. Diffusely tender with deep palpation MSK: normal bulk and tone. No boney abnormalities or deformity of lower extremities. Right foot is exquisitely tender to palpation of the toes, arch and heel. Neurological: alert & oriented x 3.  Skin: warm and dry. Erythema over medial aspect of  right big toe, medial arch and heel of right foot.  Ecchymosis on medial aspect of distal portion of lower extremity. Psych: Normal mood  Labs: CBC    Component Value Date/Time   WBC 13.9 (H) 08/04/2022 2340   RBC 4.79 08/04/2022 2340   HGB 15.3 08/04/2022 2353   HCT 45.0 08/04/2022 2353   PLT 275 08/04/2022 2340   MCV 95.0 08/04/2022 2340   MCH 31.9 08/04/2022 2340   MCHC 33.6 08/04/2022 2340   RDW 13.2 08/04/2022 2340   LYMPHSABS 1.7 08/04/2022 2340   MONOABS 0.7 08/04/2022 2340   EOSABS 0.4 08/04/2022 2340   BASOSABS 0.0 08/04/2022 2340     CMP     Component Value Date/Time   NA 137 08/04/2022 2353   K 3.0 (L) 08/04/2022 2353   CL 101 08/04/2022 2353   CO2 20 (L) 08/04/2022 2340   GLUCOSE 121 (H) 08/04/2022 2353   BUN 11 08/04/2022 2353   CREATININE 1.20 08/04/2022 2353  CALCIUM 8.1 (L) 08/04/2022 2340   PROT 7.0 08/04/2022 2340   ALBUMIN 3.8 08/04/2022 2340   AST 22 08/04/2022 2340   ALT 20 08/04/2022 2340   ALKPHOS 44 08/04/2022 2340   BILITOT 0.5 08/04/2022 2340   GFRNONAA >60 08/04/2022 2340   GFRAA >60 11/02/2019 1759    Imaging:  Chest x-ray-left chest cardiac device in place.  No focal opacities.  Does have some vascular congestion but no evident pleural effusion or pneumothorax  CTA Chest and abdomen -no acute findings pulmonary process, no abdominal abnormality  RUQ US - echogenic liver consistent with fatty infiltration. Gallbladder wall thickness but not stones, pericholecystic fluid or murphy's sign  Right foot/ankle xray - No evidence of bony abnormality or fracture. Heel spurs present.   EKG: personally reviewed my interpretation is regular rhythm, rate of 75 BPM. LAD. No acute st wave changes. Similar EKG when compared to prior from 01/2022   ASSESSMENT & PLAN:   Assessment & Plan by Problem: Principal Problem:   Electrolyte abnormality  Gerald Simpson is a 56 y/o person livign with a history of CAD s/p PCI x4 in [facility], HFrEF,  bradycardia s/p pacemaker placement, HTN, HLD, T2DM, gout, seasonal allergies, and OSA brought in by EMS after having an episode of diaphoresis, nausea, vomiting with worsening right lower extremity pain. He will be admitted for gout flair, and electrolyte abnormalities secondary to gastroenteritis  Electrolyte abnormalities 2/2 gastroenteritis Hx of LUQ abdominal pain Patient with episode of nausea vomiting and diarrhea as well as left upper quadrant abdominal pain that is now resolved.  He states that this occurred after eating fast food.  Suspicious for gastroenteritis that has now resolved. CTA abdomen negative for any evidence of ischemic bowel with elevated lactic acid and pain. Stools have been non-bloody. Liver enzyme, bili, and alk phos normal. Lipase and RUQ [place] negative. Work up in the past per the patient has been negative. He has not had a colonoscopy despite his father passing of colon cancer at a young age, but notes he would rather not know if he has it or not. He has been given at home stool kit sampling which he has not done. Encouraged him to follow up with his PCP -Given 40 meq of K in ED, will give additional 40 meq -Mag corrected with 2gram IV, recheck tomorrow -Zofran PRN for nausea -If recurrent diarrhea, send GI panel. Low suspicion for Cdiff -Do not suspect acute abdomen or need for further imaging -Do not believe this is ACS, EKG and troponin normal  Right lower extremity pain suspicious for gout flair Hx of gout Patient with long standing history of gout not on allopurinol as he states he was told not to take this anymore as it can worsen gout. The foot is exquisitely tender to touch with erythema over multiple joints. X ray is negative for any acute abnormality aside from tissue edema. With him not being on urate lowering therapy, suspicious for gout flair. He denies trauma to the area and I do not suspect septic joint. His elevated WBC and lactate are likely secondary to  his gastroenteritis. Unable to obtain MRI with pacemaker per patient. If no improvement with gout treatment can get CT. Patient would like to avoid steroids as they make him feel funny and make his heart race. Hopeful does not have recurrent n/v/d with colchicine, but patient states it has helped in the past.  -colchine 1.2 mg now, then .6 mg one hour later and daily. -  one dose of Toradol, avoid recurrent dosing with gastritis and CAD  -monitor erythema, pain, and warmth, if no improvement and systemic symptoms can order CT scan -restart allopurinol once flair resolved  Leukocytosis Anion gap metabolic acidosis 2/2 lactic acidemia  On presentation patient with evidence of acute inflammation likely secondary to his gastroenteritis and gout flair. AGMA likely secondary to lactic acid in setting of n/v/d. Do not suspect systemic infectious process. He has remained afebrile and hemodynamically stable. ED physician did obtain blood cultures. Imaging without bony abnormalities but as per above, if no improvement in foot, can obtain CT -trend lactic acid to normalization -follow blood cultures -if febrile, start broad abx and consider CT RLE -repeat bmp tomorrow -received 1L of fluids in ED, will not give additional with HFrEF history  CAD s/p PCI x 4 Bradycardia s/p pacemaker placement Extensive cardiac history with multiple stents placed 20 years ago, has not had any recently. He has been adherent to his brilinta and atorvastatin. Prior presentations for his MI were not chest pain but n/v/d and diaphoresis. Troponin's normal and EKG without acute changes, do not suspect ACS. Of note, he only takes aspirin if he has it available and is not sure if he is supposed to be on it as well as brilinta.  -telemetry -monitor for signs of ACS, repeat troponin and EKG as needed -continue brilinta, atorvastatin, and metoprolol  Ischemic cardiomyopathy HFrEF Stg C, NYHA III Patient without evidence of  hypervolemia on examination. GDMT of spironolactone, entresto, and metoprolol. Denies being on SGLT-2. Last echo per chart review 08/22 with EF of 45%.  -restart spironolactone, metoprolol, entresto, and lasix  Y8AX Patient uncertain of last K5V, will repeat today. Home regimen of metformin 1000 mg BID. At one point required insulin but none recently. States he does have some numbness in his feet, possible diabetic neuropathy.   HLD Continue home atorvastatin 40 mg daily  OSA Patient denies wearing CPAP at home but diagnosed with OSA in the past.   Tobacco use disorder Nicotine patch, asked patient to not use smokeless tobacco while admitted  Wheezing Patient with wheezing on exam. States he has been prescribed inhalers in the past and never had lung function testing per chart review. He notes he has been prescribed a yellow inhaler in the past which makes him funny so he doesn't use it. 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  Fredirick Lathe PRN for wheezing -Will need outpatient follow up  Diet: Heart Healthy VTE: Enoxaparin IVF: None,None Code: Full  Prior to Admission Living Arrangement: Home Anticipated Discharge Location: Home Barriers to Discharge: Improvement in foot pain   Dispo: Admit patient to Inpatient with expected length of stay greater than 2 midnights.  Signed: Riesa Pope, MD Internal Medicine Resident PGY-3  08/05/2022, 9:05 AM

## 2022-08-06 DIAGNOSIS — E878 Other disorders of electrolyte and fluid balance, not elsewhere classified: Secondary | ICD-10-CM

## 2022-08-06 LAB — COMPREHENSIVE METABOLIC PANEL
ALT: 17 U/L (ref 0–44)
AST: 18 U/L (ref 15–41)
Albumin: 3.5 g/dL (ref 3.5–5.0)
Alkaline Phosphatase: 38 U/L (ref 38–126)
Anion gap: 12 (ref 5–15)
BUN: 10 mg/dL (ref 6–20)
CO2: 22 mmol/L (ref 22–32)
Calcium: 8.1 mg/dL — ABNORMAL LOW (ref 8.9–10.3)
Chloride: 102 mmol/L (ref 98–111)
Creatinine, Ser: 0.99 mg/dL (ref 0.61–1.24)
GFR, Estimated: >60 mL/min (ref >60)
Glucose, Bld: 209 mg/dL — ABNORMAL HIGH (ref 70–99)
Potassium: 4 mmol/L (ref 3.5–5.1)
Sodium: 136 mmol/L (ref 135–145)
Total Bilirubin: 0.5 mg/dL (ref 0.3–1.2)
Total Protein: 6.3 g/dL — ABNORMAL LOW (ref 6.5–8.1)

## 2022-08-06 LAB — BLOOD GAS, VENOUS
Acid-Base Excess: 1.7 mmol/L (ref 0.0–2.0)
Bicarbonate: 26.6 mmol/L (ref 20.0–28.0)
O2 Saturation: 77.6 %
Patient temperature: 37
pCO2, Ven: 42 mmHg — ABNORMAL LOW (ref 44–60)
pH, Ven: 7.41 (ref 7.25–7.43)
pO2, Ven: 44 mmHg (ref 32–45)

## 2022-08-06 LAB — CBC WITH DIFFERENTIAL/PLATELET
Abs Immature Granulocytes: 0.07 10*3/uL (ref 0.00–0.07)
Basophils Absolute: 0 10*3/uL (ref 0.0–0.1)
Basophils Relative: 0 %
Eosinophils Absolute: 0 10*3/uL (ref 0.0–0.5)
Eosinophils Relative: 0 %
HCT: 37.6 % — ABNORMAL LOW (ref 39.0–52.0)
Hemoglobin: 13.1 g/dL (ref 13.0–17.0)
Immature Granulocytes: 1 %
Lymphocytes Relative: 7 %
Lymphs Abs: 0.9 10*3/uL (ref 0.7–4.0)
MCH: 31.9 pg (ref 26.0–34.0)
MCHC: 34.8 g/dL (ref 30.0–36.0)
MCV: 91.5 fL (ref 80.0–100.0)
Monocytes Absolute: 0.3 10*3/uL (ref 0.1–1.0)
Monocytes Relative: 2 %
Neutro Abs: 11.3 10*3/uL — ABNORMAL HIGH (ref 1.7–7.7)
Neutrophils Relative %: 90 %
Platelets: 221 10*3/uL (ref 150–400)
RBC: 4.11 MIL/uL — ABNORMAL LOW (ref 4.22–5.81)
RDW: 12.9 % (ref 11.5–15.5)
WBC: 12.5 10*3/uL — ABNORMAL HIGH (ref 4.0–10.5)
nRBC: 0 % (ref 0.0–0.2)

## 2022-08-06 LAB — TECHNOLOGIST SMEAR REVIEW: Clinical Information: ELEVATED

## 2022-08-06 LAB — VITAMIN B12: Vitamin B-12: 188 pg/mL (ref 180–914)

## 2022-08-06 LAB — GLUCOSE, CAPILLARY: Glucose-Capillary: 144 mg/dL — ABNORMAL HIGH (ref 70–99)

## 2022-08-06 LAB — MAGNESIUM: Magnesium: 1.6 mg/dL — ABNORMAL LOW (ref 1.7–2.4)

## 2022-08-06 LAB — LACTIC ACID, PLASMA
Lactic Acid, Venous: 2.5 mmol/L (ref 0.5–1.9)
Lactic Acid, Venous: 1.7 mmol/L (ref 0.5–1.9)

## 2022-08-06 LAB — MRSA NEXT GEN BY PCR, NASAL: MRSA by PCR Next Gen: NOT DETECTED

## 2022-08-06 MED ORDER — COLCHICINE 0.6 MG PO TABS: 0.6000 mg | ORAL_TABLET | Freq: Every day | ORAL | 1 refills | Status: DC

## 2022-08-06 MED ORDER — ALLOPURINOL 100 MG PO TABS: 100.0000 mg | ORAL_TABLET | Freq: Every day | ORAL | 1 refills | Status: AC

## 2022-08-06 MED ORDER — TICAGRELOR 60 MG PO TABS: 60.0000 mg | ORAL_TABLET | Freq: Two times a day (BID) | ORAL | 1 refills | Status: DC

## 2022-08-06 MED ORDER — MAGNESIUM SULFATE 2 GM/50ML IV SOLN
2.0000 g | Freq: Once | INTRAVENOUS | Status: AC
  Administered 2022-08-06: 2 g via INTRAVENOUS
  Filled 2022-08-06: qty 50

## 2022-08-06 MED ORDER — OXYCODONE-ACETAMINOPHEN 5-325 MG PO TABS: 1.0000 | ORAL_TABLET | Freq: Three times a day (TID) | ORAL | 0 refills | Status: AC | PRN

## 2022-08-06 NOTE — Hospital Course (Signed)
Doing well except for foot. Swelling seems to have improved, but continued pain. No feeling in toe. Continued back pain, stable from prior. Stomach is doing well, but unsure of how he will do with food. Denies heartburn. Felt sick 30 min after second meal with emesis prior to admission. R ankle painful. Reports "I'm a poor man, I got to eat poor food, baloney sandwiches." Reports last doctor told him not to take allopurinol during gout flare because it would lengthen his gout flare. Discussed that gout flare would improve long-term with allopurinol. Denies diarrhea. Denies BM since admission.   Abd: soft, non-tender to palpation R ankle and toes very tender to palpation. Not at calf.  Back: tender to palpation in lower lumbar region.

## 2022-08-06 NOTE — Discharge Instructions (Addendum)
[name] were admitted for nausea, vomiting, abdominal pain, and right foot pain and treated for acute gastritis and gout flare.  Treated with supportive therapy including IV fluids and pain medicine.  We are discharging you home now that you are doing better. To help assist you on your road to recovery, I have written the following recommendations:  Your acute gastritis was likely caused by food intolerance or a viral illness.  These are usually self limited problems that resolve on their own.  You may experience residual nausea and vomiting for several days as you recover.  For your gout, begin taking allopurinol and colchicine daily.  Over the long-term, this medicine is the best for reducing the frequency of gout flares.  For your acute pain, I have provided 9 doses of oxycodone-acetaminophen, enough for 3 days.  Please continue taking the rest of your medications as indicated below.  Finally, follow-up with your primary care doctor as soon as possible after you leave the hospital.  Routine follow-up important for the management of gout because you will need regular monitoring of blood uric acid levels while you are on allopurinol.  It was a privilege to be a part of your hospital care team, and I hope you feel better as a result of your stay.  All the best, Nani Gasser, MD

## 2022-08-06 NOTE — Discharge Summary (Addendum)
Name: Gerald Simpson MRN: [id number] DOB: 1966/10/30 56 y.o. PCP: Patient, No Pcp Per  Date of Admission: 08/04/2022 11:21 PM Date of Discharge: 08/06/2022 1:39 PM Attending Physician: Dr. Charise Killian  Discharge Diagnosis: Principal Problem:   Electrolyte abnormality   Discharge Medications: Allergies as of 08/06/2022       Reactions   Other Other (See Comments)   Cannot take decongestants due to Cardiac history   Tramadol Other (See Comments)   Heart rate drops very low, very shallow breathing   Isosorbide Nitrate Rash, Other (See Comments)   Causes skin rashes, severe headaches   Morphine And Related Other (See Comments)   Patient does not want to take this medication and it causes severe constipation.        Medication List     STOP taking these medications    Admelog SoloStar 100 UNIT/ML KwikPen Generic drug: insulin lispro   aspirin 81 MG chewable tablet   gabapentin 800 MG tablet Commonly known as: Neurontin   HYDROcodone-acetaminophen 5-325 MG tablet Commonly known as: NORCO/VICODIN   Metamucil Smooth Texture 58.6 % powder Generic drug: psyllium   naproxen 375 MG tablet Commonly known as: NAPROSYN       TAKE these medications    albuterol (2.5 MG/3ML) 0.083% nebulizer solution Commonly known as: PROVENTIL Take 2.5 mg by nebulization daily as needed for wheezing or shortness of breath.   allopurinol 100 MG tablet Commonly known as: ZYLOPRIM Take 1 tablet (100 mg total) by mouth daily. What changed:  when to take this reasons to take this   atorvastatin 40 MG tablet Commonly known as: LIPITOR Take 40 mg by mouth every morning.   colchicine 0.6 MG tablet Take 1 tablet (0.6 mg total) by mouth daily. Start taking on: August 07, 2022 What changed: when to take this   Entresto 24-26 MG Generic drug: sacubitril-valsartan Take 1 tablet by mouth 2 (two) times daily.   furosemide 20 MG tablet Commonly known as: LASIX Take 20 mg by mouth  daily.   lidocaine 5 % Commonly known as: Lidoderm Place 1 patch onto the skin daily. Remove & Discard patch within 12 hours or as directed by MD   loratadine 10 MG tablet Commonly known as: CLARITIN Take 10 mg by mouth daily.   metFORMIN 1000 MG tablet Commonly known as: GLUCOPHAGE Take 1,000 mg by mouth 2 (two) times daily.   metoprolol tartrate 50 MG tablet Commonly known as: LOPRESSOR Take 50 mg by mouth 2 (two) times daily.   montelukast 10 MG tablet Commonly known as: SINGULAIR Take 10 mg by mouth every evening.   nitroGLYCERIN 0.4 MG SL tablet Commonly known as: NITROSTAT Place 0.4 mg under the tongue every 5 (five) minutes as needed for chest pain.   oxyCODONE-acetaminophen 5-325 MG tablet Commonly known as: PERCOCET/ROXICET Take 1 tablet by mouth every 8 (eight) hours as needed for up to 3 days for severe pain.   spironolactone 25 MG tablet Commonly known as: ALDACTONE Take 25 mg by mouth every morning.   ticagrelor 60 MG Tabs tablet Commonly known as: BRILINTA Take 1 tablet (60 mg total) by mouth 2 (two) times daily. What changed:  medication strength how much to take   Trelegy Ellipta 100-62.5-25 MCG/ACT Aepb Generic drug: Fluticasone-Umeclidin-Vilant Inhale 1 puff into the lungs daily.   Vitamin D (Ergocalciferol) 1.25 MG (50000 UNIT) Caps capsule Commonly known as: DRISDOL Take 50,000 Units by mouth every 7 (seven) days.        Disposition  and follow-up:   [name] is a 56 y.o. year old male admitted to [facility] for electrolyte abnormalities due to gastroenteritis, and gout flare. They were discharged from [facility] on hospital day 1 in Good condition.  At the hospital follow up visit please address:  Gout Patient was started on allopurinol 100 mg daily and colchicine 0.6 mg daily.  Obtain uric acid level at follow-up and titrate allopurinol accordingly. - PDMP reviewed for a 3-day course of  oxycodone-acetaminophen 5-325 to be taken every 8 hours as needed for severe pain.  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 - Please ensure close follow-up with cardiology on discharge.  Labs / imaging needed at time of follow-up: - Serum uric acid level  Pending labs: - Blood cultures  Hospital Course by problem list: Electrolyte disturbance secondary to gastroenteritis Patient presented with acute nausea, vomiting, diarrhea, abdominal pain with elevated lactate.  Initial concern was for ACS because this presentation was similar to his previous MI.  MI was ruled out.  Patient was treated with supportive therapy and electrolyte replacement.  His symptoms improved rapidly.  Lactate resolved to normal by day of discharge.  He was to eat on day of discharge.  Suspect a self-limited form of viral gastroenteritis or food intolerance.  Acute gout flare Patient presented with several days of right foot pain localized to ankle and first MTP joint.  Is not on urate lowering therapy outside of the hospital.  He was started on allopurinol 100 mg daily and colchicine 0.6 mg daily.  Pain was severe on discharge so he was prescribed a 3-day supply of oxycodone-acetaminophen 5/325.  Discharge Exam:  Subjective: Showering at time of discharge exam.  Abdominal symptoms including nausea, vomiting, diarrhea, have improved.  Right foot is still painful.  Chronic back pain still present.  Requests a short course of Percocet for pain control.   Blood pressure 123/75, pulse 60, temperature (!) 97.4 F (36.3 C), temperature source Oral, resp. rate 17, height '5\' 10"'$  (1.778 m), weight 122.5 kg, SpO2 95 %. General: Well-appearing. Cardiovascular: Regular rate and rhythm.  Left radial pulse  2+. Respiratory: Normal work of breathing.  Lungs clear to auscultation. Musculoskeletal: Lumbar back tenderness without warmth, erythema, or deformity. Extremities: Exquisite tenderness to right ankle.  Minimal swelling and erythema over right ankle and right first MTP joint. Skin: Warm and dry. Neuro: Alert.  No gross focal deficits. Psych: Pleasant.  Appropriate mood and affect.  Pertinent studies and procedures:   Latest Reference Range & Units 08/06/22 00:51  Sodium 135 - 145 mmol/L 136  Potassium 3.5 - 5.1 mmol/L 4.0  Chloride 98 - 111 mmol/L 102  CO2 22 - 32 mmol/L 22  Glucose 70 - 99 mg/dL 209 (H)  BUN 6 - 20 mg/dL 10  Creatinine 0.61 - 1.24 mg/dL 0.99  Calcium 8.9 - 10.3 mg/dL 8.1 (L)  Anion gap 5 - 15  12  Magnesium 1.7 - 2.4 mg/dL 1.6 (L)  Alkaline Phosphatase 38 - 126 U/L 38  Albumin 3.5 - 5.0 g/dL 3.5  AST 15 - 41 U/L 18  ALT 0 - 44 U/L 17  Total Protein 6.5 - 8.1 g/dL 6.3 (L)  Total Bilirubin 0.3 - 1.2 mg/dL 0.5  GFR, Estimated >60 mL/min >60  (  H): Data is abnormally high (L): Data is abnormally low   Latest Reference Range & Units 08/06/22 00:51  WBC 4.0 - 10.5 K/uL 12.5 (H)  RBC 4.22 - 5.81 MIL/uL 4.11 (L)  Hemoglobin 13.0 - 17.0 g/dL 13.1  HCT 39.0 - 52.0 % 37.6 (L)  MCV 80.0 - 100.0 fL 91.5  MCH 26.0 - 34.0 pg 31.9  MCHC 30.0 - 36.0 g/dL 34.8  RDW 11.5 - 15.5 % 12.9  Platelets 150 - 400 K/uL 221  nRBC 0.0 - 0.2 % 0.0  (H): Data is abnormally high (L): Data is abnormally low   Latest Reference Range & Units 08/05/22 19:22 08/06/22 00:51 08/06/22 05:57  Lactic Acid, Venous 0.5 - 1.9 mmol/L 3.6 (HH) 2.5 (HH) 1.7  (HH): Data is critically high  Foot and ankle x-rays notable for bone spurs but no other abnormalities. CTA chest/abdomen/pelvis notable for diverticulosis otherwise no abnormalities Right upper quadrant ultrasound notable for echogenic liver compatible with fatty infiltration   Discharge Instructions      [name]  You were  admitted for nausea, vomiting, abdominal pain, and right foot pain and treated for acute gastritis and gout flare.  Treated with supportive therapy including IV fluids and pain medicine.  We are discharging you home now that you are doing better. To help assist you on your road to recovery, I have written the following recommendations:  Your acute gastritis was likely caused by food intolerance or a viral illness.  These are usually self limited problems that resolve on their own.  You may experience residual nausea and vomiting for several days as you recover.  For your gout, begin taking allopurinol and colchicine daily.  Over the long-term, this medicine is the best for reducing the frequency of gout flares.  For your acute pain, I have provided 9 doses of oxycodone-acetaminophen, enough for 3 days.  Please continue taking the rest of your medications as indicated below.  Finally, follow-up with your primary care doctor as soon as possible after you leave the hospital.  Routine follow-up important for the management of gout because you will need regular monitoring of blood uric acid levels while you are on allopurinol.  It was a privilege to be a part of your hospital care team, and I hope you feel better as a result of your stay.  All the best, Nani Gasser, MD     Signed: Nani Gasser MD 08/06/2022, 1:39 PM   Pager: [phone]

## 2022-08-10 LAB — CULTURE, BLOOD (ROUTINE X 2)
Culture: NO GROWTH
Culture: NO GROWTH
Special Requests: ADEQUATE

## 2023-02-20 ENCOUNTER — Other Ambulatory Visit: Payer: Self-pay

## 2023-02-20 ENCOUNTER — Emergency Department (HOSPITAL_COMMUNITY)
Admission: EM | Admit: 2023-02-20 | Discharge: 2023-02-20 | Disposition: A | Discharge status: Home / Self Care | Payer: Medicaid Other | Attending: Emergency Medicine | Admitting: Emergency Medicine

## 2023-02-20 ENCOUNTER — Emergency Department (HOSPITAL_COMMUNITY): Payer: Medicaid Other

## 2023-02-20 ENCOUNTER — Encounter (HOSPITAL_COMMUNITY): Payer: Self-pay | Admitting: Emergency Medicine

## 2023-02-20 ENCOUNTER — Emergency Department (HOSPITAL_BASED_OUTPATIENT_CLINIC_OR_DEPARTMENT_OTHER): Payer: Medicaid Other

## 2023-02-20 DIAGNOSIS — M109 Gout, unspecified: Secondary | ICD-10-CM

## 2023-02-20 DIAGNOSIS — I2510 Atherosclerotic heart disease of native coronary artery without angina pectoris: Secondary | ICD-10-CM | POA: Diagnosis not present

## 2023-02-20 DIAGNOSIS — M7989 Other specified soft tissue disorders: Secondary | ICD-10-CM | POA: Diagnosis not present

## 2023-02-20 DIAGNOSIS — I10 Essential (primary) hypertension: Secondary | ICD-10-CM | POA: Diagnosis not present

## 2023-02-20 DIAGNOSIS — Z7984 Long term (current) use of oral hypoglycemic drugs: Secondary | ICD-10-CM | POA: Diagnosis not present

## 2023-02-20 DIAGNOSIS — M79671 Pain in right foot: Secondary | ICD-10-CM | POA: Diagnosis present

## 2023-02-20 DIAGNOSIS — M25562 Pain in left knee: Secondary | ICD-10-CM | POA: Diagnosis not present

## 2023-02-20 DIAGNOSIS — Z79899 Other long term (current) drug therapy: Secondary | ICD-10-CM | POA: Insufficient documentation

## 2023-02-20 DIAGNOSIS — Z7951 Long term (current) use of inhaled steroids: Secondary | ICD-10-CM | POA: Diagnosis not present

## 2023-02-20 DIAGNOSIS — E1140 Type 2 diabetes mellitus with diabetic neuropathy, unspecified: Secondary | ICD-10-CM | POA: Diagnosis not present

## 2023-02-20 DIAGNOSIS — M10071 Idiopathic gout, right ankle and foot: Secondary | ICD-10-CM | POA: Insufficient documentation

## 2023-02-20 LAB — CBC WITH DIFFERENTIAL/PLATELET
Abs Immature Granulocytes: 0.02 10*3/uL (ref 0.00–0.07)
Basophils Absolute: 0.1 10*3/uL (ref 0.0–0.1)
Basophils Relative: 1 %
Eosinophils Absolute: 0.2 10*3/uL (ref 0.0–0.5)
Eosinophils Relative: 2 %
HCT: 42.1 % (ref 39.0–52.0)
Hemoglobin: 14.9 g/dL (ref 13.0–17.0)
Immature Granulocytes: 0 %
Lymphocytes Relative: 19 %
Lymphs Abs: 1.8 10*3/uL (ref 0.7–4.0)
MCH: 31.9 pg (ref 26.0–34.0)
MCHC: 35.4 g/dL (ref 30.0–36.0)
MCV: 90.1 fL (ref 80.0–100.0)
Monocytes Absolute: 0.8 10*3/uL (ref 0.1–1.0)
Monocytes Relative: 8 %
Neutro Abs: 6.6 10*3/uL (ref 1.7–7.7)
Neutrophils Relative %: 70 %
Platelets: 266 10*3/uL (ref 150–400)
RBC: 4.67 MIL/uL (ref 4.22–5.81)
RDW: 13.3 % (ref 11.5–15.5)
WBC: 9.5 10*3/uL (ref 4.0–10.5)
nRBC: 0 % (ref 0.0–0.2)

## 2023-02-20 LAB — COMPREHENSIVE METABOLIC PANEL
ALT: 32 U/L (ref 0–44)
AST: 27 U/L (ref 15–41)
Albumin: 3.8 g/dL (ref 3.5–5.0)
Alkaline Phosphatase: 39 U/L (ref 38–126)
Anion gap: 10 (ref 5–15)
BUN: <5 mg/dL — ABNORMAL LOW (ref 6–20)
CO2: 26 mmol/L (ref 22–32)
Calcium: 8.6 mg/dL — ABNORMAL LOW (ref 8.9–10.3)
Chloride: 100 mmol/L (ref 98–111)
Creatinine, Ser: 0.92 mg/dL (ref 0.61–1.24)
GFR, Estimated: >60 mL/min (ref >60)
Glucose, Bld: 96 mg/dL (ref 70–99)
Potassium: 3.1 mmol/L — ABNORMAL LOW (ref 3.5–5.1)
Sodium: 136 mmol/L (ref 135–145)
Total Bilirubin: 0.6 mg/dL (ref 0.3–1.2)
Total Protein: 7.2 g/dL (ref 6.5–8.1)

## 2023-02-20 MED ORDER — KETOROLAC TROMETHAMINE 30 MG/ML IJ SOLN
30.0000 mg | Freq: Once | INTRAMUSCULAR | Status: AC
  Administered 2023-02-20: 30 mg via INTRAVENOUS
  Filled 2023-02-20: qty 1

## 2023-02-20 MED ORDER — HYDROCODONE-ACETAMINOPHEN 5-325 MG PO TABS
1.0000 | ORAL_TABLET | Freq: Once | ORAL | Status: AC
  Administered 2023-02-20: 1 via ORAL
  Filled 2023-02-20: qty 1

## 2023-02-20 MED ORDER — COLCHICINE 0.6 MG PO TABS: 0.6000 mg | ORAL_TABLET | Freq: Every day | ORAL | 0 refills | Status: DC

## 2023-02-20 MED ORDER — FENTANYL CITRATE PF 50 MCG/ML IJ SOSY
50.0000 ug | PREFILLED_SYRINGE | Freq: Once | INTRAMUSCULAR | Status: AC
  Administered 2023-02-20: 50 ug via INTRAVENOUS
  Filled 2023-02-20: qty 1

## 2023-02-20 MED ORDER — POTASSIUM CHLORIDE CRYS ER 20 MEQ PO TBCR
40.0000 meq | EXTENDED_RELEASE_TABLET | Freq: Once | ORAL | Status: AC
  Administered 2023-02-20: 40 meq via ORAL
  Filled 2023-02-20: qty 2

## 2023-02-20 MED ORDER — HYDROCODONE-ACETAMINOPHEN 5-325 MG PO TABS: 1.0000 | ORAL_TABLET | Freq: Four times a day (QID) | ORAL | 0 refills | Status: DC | PRN

## 2023-02-20 NOTE — ED Notes (Signed)
Discharge instructions provided by edp were discussed with pt. Pt verbalized understanding with no additional questions at this time. Pt to go home with daughter.

## 2023-02-20 NOTE — Care Management (Signed)
ED RNCM  noted consult for medication assistance. Reviewed record patient has out of [place]  insurance.  Messaged Schutts PA-C, TOC is unable to assist with narcotic. Discussed sending prescriptions to [facility] pharmacy and having patient return in the am if transportation is not an issue.  Confirmed with EDP transportation is not an issue informed EDP that prescription location would need to be change to [facility] pharmacy.  Also sent referral to [facility].  Patient has been discharged sent patient message concerning follow up. No further ED TOC needs identified.

## 2023-02-20 NOTE — ED Provider Triage Note (Signed)
Emergency Medicine Provider Triage Evaluation Note  Gerald Simpson , a 57 y.o. male  was evaluated in triage.  Pt complains of right foot and ankle pain, believed to be gout, similar pain in past.  Pain started yesterday, found to colchicine, took this and elevated leg, pain worse this morning.  Denies recent falls or injury.  States that he recently moved here from [place] and has been out of all of his medications since April 5 and is requesting refills.  Is on Brilinta for prior MI.  Review of Systems  Positive: As above Negative: As above  Physical Exam  BP (!) 177/115   Pulse 63   Temp 97.9 F (36.6 C) (Oral)   Resp 17   Ht 5\' 10"  (1.778 m)   Wt 122.5 kg   SpO2 99%   BMI 38.74 kg/m  Gen:   Awake, no distress   Resp:  Normal effort  MSK:   Moves extremities without difficulty  Other:  Mild swelling, redness to dorsum right foot  Medical Decision Making  Medically screening exam initiated at 11:23 AM.  Appropriate orders placed.  Dorell Sonnier was informed that the remainder of the evaluation will be completed by another provider, this initial triage assessment does not replace that evaluation, and the importance of remaining in the ED until their evaluation is complete.     Jeannie Fend, PA-C 02/20/23 1124

## 2023-02-20 NOTE — Discharge Instructions (Signed)
You have been seen today for your complaint of joint pain and swelling. Your lab work was overall reassuring. Your imaging showed some soft tissue swelling but was otherwise reassuring. Your discharge medications include Norco. This is an opioid pain medication. You should only take this medication as needed for severe pain. You should not drive, operate heavy machinery or make important decisions while taking this medication. You should use alternative methods for pain relief while taking this medication including stretching, gentle range of motion, and alternating tylenol and colchicine. Colchicine. Take this daily until your symptoms improve. Follow up with: a primary care provider in one week for reevaluation of your symptoms Please seek immediate medical care if you develop any of the following symptoms: Have severe or uncontrolled pain. Cannot urinate. At this time there does not appear to be the presence of an emergent medical condition, however there is always the potential for conditions to change. Please read and follow the below instructions.  Do not take your medicine if  develop an itchy rash, swelling in your mouth or lips, or difficulty breathing; call 911 and seek immediate emergency medical attention if this occurs.  You may review your lab tests and imaging results in their entirety on your MyChart account.  Please discuss all results of fully with your primary care provider and other specialist at your follow-up visit.  Note: Portions of this text may have been transcribed using voice recognition software. Every effort was made to ensure accuracy; however, inadvertent computerized transcription errors may still be present.

## 2023-02-20 NOTE — Progress Notes (Signed)
LLE venous duplex has been completed.   Results can be found under chart review under CV PROC. 02/20/2023 5:42 PM Mossie Gilder RVT, RDMS

## 2023-02-20 NOTE — ED Provider Notes (Signed)
[place] EMERGENCY DEPARTMENT AT [facility] Provider Note   CSN: [id number] Arrival date & time: 02/20/23  1112     History  Chief Complaint  Patient presents with   Gout    Gerald Simpson is a 57 y.o. male.  With extensive history including but not limited to hypertension, CAD, gout, type 2 diabetes, anxiety who presents to the ED for evaluation of right foot and left knee pain.  States feels similar to flareups of gout that he has had in the past.  He has tried taking allopurinol but states that it made the flareups more common.  Last flareup was nearly 1 year ago.  His symptoms began approximately 1 week ago.  He denies injuries or falls.  He recently moved here from [place] and has been out of his home medications including Brilinta since 02/09/2023.  He is in the process of getting insurance but was told that it may take up to 3 months.  Pain is described as an intense throbbing sensation.  He reports diabetic neuropathy but denies worsening of any tingling.  He denies systemic fevers or chills.  Denies chest pain or shortness of breath.  HPI     Home Medications Prior to Admission medications   Medication Sig Start Date End Date Taking? Authorizing Provider  colchicine 0.6 MG tablet Take 1 tablet (0.6 mg total) by mouth daily. 02/20/23  Yes Lunabelle Oatley, Edsel Petrin, PA-C  HYDROcodone-acetaminophen (NORCO/VICODIN) 5-325 MG tablet Take 1 tablet by mouth every 6 (six) hours as needed. 02/20/23  Yes Shailynn Fong, Edsel Petrin, PA-C  albuterol (PROVENTIL) (2.5 MG/3ML) 0.083% nebulizer solution Take 2.5 mg by nebulization daily as needed for wheezing or shortness of breath. Patient not taking: Reported on 08/05/2022    [provider]  allopurinol (ZYLOPRIM) 100 MG tablet Take 1 tablet (100 mg total) by mouth daily. 08/06/22   Marrianne Mood, MD  atorvastatin (LIPITOR) 40 MG tablet Take 40 mg by mouth every morning. 07/04/22   [provider]  ENTRESTO 24-26 MG Take 1  tablet by mouth 2 (two) times daily. 10/13/19   [provider]  furosemide (LASIX) 20 MG tablet Take 20 mg by mouth daily. 10/13/19   [provider]  lidocaine (LIDODERM) 5 % Place 1 patch onto the skin daily. Remove & Discard patch within 12 hours or as directed by MD Patient not taking: Reported on 08/05/2022 08/18/20   Couture, Cortni S, PA-C  loratadine (CLARITIN) 10 MG tablet Take 10 mg by mouth daily. 07/04/22   [provider]  metFORMIN (GLUCOPHAGE) 1000 MG tablet Take 1,000 mg by mouth 2 (two) times daily. 10/13/19   [provider]  metoprolol tartrate (LOPRESSOR) 50 MG tablet Take 50 mg by mouth 2 (two) times daily.    [provider]  montelukast (SINGULAIR) 10 MG tablet Take 10 mg by mouth every evening.    [provider]  nitroGLYCERIN (NITROSTAT) 0.4 MG SL tablet Place 0.4 mg under the tongue every 5 (five) minutes as needed for chest pain.     [provider]  spironolactone (ALDACTONE) 25 MG tablet Take 25 mg by mouth every morning. 07/04/22   [provider]  ticagrelor (BRILINTA) 60 MG TABS tablet Take 1 tablet (60 mg total) by mouth 2 (two) times daily. 08/06/22   Marrianne Mood, MD  TRELEGY ELLIPTA 100-62.5-25 MCG/ACT AEPB Inhale 1 puff into the lungs daily. Patient not taking: Reported on 08/05/2022 07/04/22   [provider]  Vitamin D,  Ergocalciferol, (DRISDOL) 1.25 MG (50000 UNIT) CAPS capsule Take 50,000 Units by mouth every 7 (seven) days. 05/04/22   [provider]      Allergies    Other, Tramadol, Isosorbide nitrate, and Morphine and related    Review of Systems   Review of Systems  Musculoskeletal:  Positive for arthralgias and joint swelling.  All other systems reviewed and are negative.   Physical Exam Updated Vital Signs BP (!) 190/100 (BP Location: Right Arm)   Pulse 72   Temp 98 F (36.7 C)   Resp 15   Ht  (1.778 m)   Wt 122.5 kg   SpO2 100%   BMI 38.74  kg/m  Physical Exam Vitals and nursing note reviewed.  Constitutional:      General: He is not in acute distress.    Appearance: Normal appearance. He is obese. He is not ill-appearing, toxic-appearing or diaphoretic.     Comments: Disheveled, Resting comfortably in bed  HENT:     Head: Normocephalic and atraumatic.  Pulmonary:     Effort: Pulmonary effort is normal. No respiratory distress.  Abdominal:     General: Abdomen is flat.  Musculoskeletal:        General: Normal range of motion.     Cervical back: Neck supple.     Comments: Exquisite ttp to the left foot with swelling and erythema to the MTP of toes 3-5. Mild swelling to the left knee with ttp to the posterior aspect. No calf swelling or ttp bilaterally.  Skin:    General: Skin is warm and dry.  Neurological:     Mental Status: He is alert and oriented to person, place, and time.  Psychiatric:        Mood and Affect: Mood normal.        Behavior: Behavior normal.     ED Results / Procedures / Treatments   Labs (all labs ordered are listed, but only abnormal results are displayed) Labs Reviewed  COMPREHENSIVE METABOLIC PANEL - Abnormal; Notable for the following components:      Result Value   Potassium 3.1 (*)    BUN <5 (*)    Calcium 8.6 (*)    All other components within normal limits  CBC WITH DIFFERENTIAL/PLATELET    EKG None  Radiology VAS [place] LOWER EXTREMITY VENOUS (DVT) (7a-7p)  Result Date: 02/20/2023  Lower Venous DVT Study Patient Name:  Gerald Simpson  Date of Exam:   02/20/2023 Medical Rec #: [id number]     Accession #:    [id number] Date of Birth: Sep 03, 1966     Patient Gender: M Patient Age:   50 years Exam Location:  [facility] Procedure:      VAS [place] LOWER EXTREMITY VENOUS (DVT) Referring Phys: Lyn Hollingshead Mahari Strahm --------------------------------------------------------------------------------  Indications: Pain and swelling of left knee.  Limitations: Poor ultrasound/tissue interface and pain  intolerance. Comparison Study: No previous exams Performing Technologist: Jody Hill RVT, RDMS  Examination Guidelines: A complete evaluation includes B-mode imaging, spectral Doppler, color Doppler, and power Doppler as needed of all accessible portions of each vessel. Bilateral testing is considered an integral part of a complete examination. Limited examinations for reoccurring indications may be performed as noted. The reflux portion of the exam is performed with the patient in reverse Trendelenburg.  +-----+---------------+---------+-----------+----------+--------------+ RIGHTCompressibilityPhasicitySpontaneityPropertiesThrombus Aging +-----+---------------+---------+-----------+----------+--------------+ CFV  Full           Yes      Yes                                 +-----+---------------+---------+-----------+----------+--------------+   +---------+---------------+---------+-----------+----------+--------------+  LEFT     CompressibilityPhasicitySpontaneityPropertiesThrombus Aging +---------+---------------+---------+-----------+----------+--------------+ CFV      Full           Yes      Yes                                 +---------+---------------+---------+-----------+----------+--------------+ SFJ      Full                                                        +---------+---------------+---------+-----------+----------+--------------+ FV Prox  Full           Yes      Yes                                 +---------+---------------+---------+-----------+----------+--------------+ FV Mid   Full           Yes      Yes                                 +---------+---------------+---------+-----------+----------+--------------+ FV DistalFull           Yes      Yes                                 +---------+---------------+---------+-----------+----------+--------------+ PFV      Full                                                         +---------+---------------+---------+-----------+----------+--------------+ POP      Full           Yes      Yes                                 +---------+---------------+---------+-----------+----------+--------------+ PTV      Full                                                        +---------+---------------+---------+-----------+----------+--------------+ PERO     Full                                                        +---------+---------------+---------+-----------+----------+--------------+     Summary: RIGHT: - No evidence of common femoral vein obstruction.  LEFT: - There is no evidence of deep vein thrombosis in the lower extremity.  - No cystic structure found in the popliteal fossa.  *See table(s) above for measurements and observations.    Preliminary    DG Foot Complete Right  Result Date: 02/20/2023 CLINICAL DATA:  Pain. EXAM: RIGHT FOOT COMPLETE - 3+ VIEW COMPARISON:  Radiograph 08/05/2022 FINDINGS: There is no evidence of fracture or dislocation. No erosive or bony destructive change. Chronic Achilles tendon enthesophyte and plantar calcaneal spur. Slight dorsal soft tissue thickening over the midfoot. IMPRESSION: 1. Slight dorsal soft tissue thickening. 2. No acute osseous abnormality. 3. Chronic Achilles tendon enthesophyte and plantar calcaneal spur. Electronically Signed   By: Narda Rutherford M.D.   On: 02/20/2023 17:14    Procedures Procedures    Medications Ordered in ED Medications  fentaNYL (SUBLIMAZE) injection 50 mcg (50 mcg Intravenous Given 02/20/23 1735)  ketorolac (TORADOL) 30 MG/ML injection 30 mg (30 mg Intravenous Given 02/20/23 1814)  potassium chloride SA (KLOR-CON M) CR tablet 40 mEq (40 mEq Oral Given 02/20/23 1814)  HYDROcodone-acetaminophen (NORCO/VICODIN) 5-325 MG per tablet 1 tablet (1 tablet Oral Given 02/20/23 1920)    ED Course/ Medical Decision Making/ A&P                             Medical Decision Making Amount and/or  Complexity of Data Reviewed Radiology: ordered.  Risk Prescription drug management.  This patient presents to the ED for concern of right foot and left ankle pain, this involves an extensive number of treatment options, and is a complaint that carries with it a high risk of complications and morbidity.  The differential diagnosis includes joint swelling and pain  Co morbidities that complicate the patient evaluation  including but not limited to hypertension, CAD, gout, type 2 diabetes, anxiety  My initial workup includes labs, imaging, pain control  Additional history obtained from: Nursing notes from this visit.  I ordered, reviewed and interpreted labs which include: CBC, CMP. Hypokalemia of 3.1, labs otherwise reassuring  I ordered imaging studies including x ray right foot, DVT left lower extremity I independently visualized and interpreted imaging which showed soft tissue swelling of the right foot, otherwise unremarkable I agree with the radiologist interpretation  Afebrile, hypertensive but otherwise hemodynamically stable.  57 year old male presenting to the ED for evaluation of right foot and left knee pain.  Has an extensive history of gout.  States it feels similar to gout presentations in the past.  Unable to take his medications currently as he has recently moved and does not have insurance at the moment.  Physical exam is consistent with presentation of gout.  There are no overlying wounds or history of injuries to raise concern for septic arthritis.  No systemic symptoms of infection.  Lab workup otherwise reassuring.  He states that he has tried ibuprofen for the pain in the past with no improvement.  He is requesting a prescription for colchicine today.  This will be prescribed as well is a short course of opioids.  He was educated on appropriate use of opioids.  Will defer steroid treatment given his diabetic status and lack of home medications.  TOC consult order was placed  for assistance with insurance and medications.  He was given return precautions.  He was encouraged to find a primary care provider soon as possible and follow-up.  Stable at discharge.  At this time there does not appear to be any evidence of an acute emergency medical condition and the patient appears stable for discharge with appropriate outpatient follow up. Diagnosis was discussed with patient who verbalizes understanding of care plan and is agreeable to discharge. I have discussed return precautions with patient who verbalizes understanding. Patient encouraged to  follow-up with their PCP within 1 week. All questions answered.  Patient's case discussed with Dr. Durwin Nora who agrees with plan to discharge with follow-up.   Note: Portions of this report may have been transcribed using voice recognition software. Every effort was made to ensure accuracy; however, inadvertent computerized transcription errors may still be present.        Final Clinical Impression(s) / ED Diagnoses Final diagnoses:  Acute gout of right foot, unspecified cause  Acute pain of left knee    Rx / DC Orders ED Discharge Orders          Ordered    HYDROcodone-acetaminophen (NORCO/VICODIN) 5-325 MG tablet  Every 6 hours PRN        02/20/23 1909    colchicine 0.6 MG tablet  Daily        02/20/23 1909    Consult to Transition of Care Team       Comments: Insurance and medication assistance  Provider:  (Not yet assigned)   02/20/23 1912              Mora Bellman 02/20/23 1934    Gloris Manchester, MD 02/21/23 [phone]

## 2023-02-20 NOTE — ED Triage Notes (Addendum)
Per [facility] pt coming from home c/o gout pain in bilateral feet x several days. Given 150 mcg fentanyl given en route.   Patient adds that he recently moved from [place] and has been out of his medications since 4/5.

## 2023-02-20 NOTE — ED Notes (Signed)
Pt ambulatory to the bathroom independently.

## 2023-02-21 ENCOUNTER — Other Ambulatory Visit (HOSPITAL_COMMUNITY): Payer: Self-pay

## 2023-03-28 ENCOUNTER — Emergency Department (HOSPITAL_COMMUNITY): Payer: Medicaid Other

## 2023-03-28 ENCOUNTER — Observation Stay (HOSPITAL_COMMUNITY)
Admission: EM | Admit: 2023-03-28 | Discharge: 2023-03-29 | Disposition: A | Discharge status: Home / Self Care | Payer: Medicaid Other | Attending: Internal Medicine | Admitting: Internal Medicine

## 2023-03-28 ENCOUNTER — Other Ambulatory Visit: Payer: Self-pay

## 2023-03-28 ENCOUNTER — Encounter (HOSPITAL_COMMUNITY): Payer: Self-pay | Admitting: Pharmacy Technician

## 2023-03-28 DIAGNOSIS — R109 Unspecified abdominal pain: Secondary | ICD-10-CM

## 2023-03-28 DIAGNOSIS — I2510 Atherosclerotic heart disease of native coronary artery without angina pectoris: Secondary | ICD-10-CM | POA: Diagnosis not present

## 2023-03-28 DIAGNOSIS — D72829 Elevated white blood cell count, unspecified: Secondary | ICD-10-CM | POA: Insufficient documentation

## 2023-03-28 DIAGNOSIS — E876 Hypokalemia: Secondary | ICD-10-CM | POA: Insufficient documentation

## 2023-03-28 DIAGNOSIS — K529 Noninfective gastroenteritis and colitis, unspecified: Secondary | ICD-10-CM | POA: Diagnosis not present

## 2023-03-28 DIAGNOSIS — E1165 Type 2 diabetes mellitus with hyperglycemia: Secondary | ICD-10-CM | POA: Diagnosis not present

## 2023-03-28 DIAGNOSIS — R1012 Left upper quadrant pain: Principal | ICD-10-CM | POA: Insufficient documentation

## 2023-03-28 DIAGNOSIS — K921 Melena: Secondary | ICD-10-CM | POA: Diagnosis not present

## 2023-03-28 DIAGNOSIS — I110 Hypertensive heart disease with heart failure: Secondary | ICD-10-CM | POA: Diagnosis not present

## 2023-03-28 DIAGNOSIS — I5022 Chronic systolic (congestive) heart failure: Secondary | ICD-10-CM | POA: Insufficient documentation

## 2023-03-28 DIAGNOSIS — Z95810 Presence of automatic (implantable) cardiac defibrillator: Secondary | ICD-10-CM | POA: Diagnosis not present

## 2023-03-28 DIAGNOSIS — Z7984 Long term (current) use of oral hypoglycemic drugs: Secondary | ICD-10-CM | POA: Diagnosis not present

## 2023-03-28 DIAGNOSIS — N179 Acute kidney failure, unspecified: Secondary | ICD-10-CM | POA: Insufficient documentation

## 2023-03-28 DIAGNOSIS — F17290 Nicotine dependence, other tobacco product, uncomplicated: Secondary | ICD-10-CM | POA: Diagnosis not present

## 2023-03-28 DIAGNOSIS — D751 Secondary polycythemia: Secondary | ICD-10-CM | POA: Insufficient documentation

## 2023-03-28 DIAGNOSIS — E119 Type 2 diabetes mellitus without complications: Secondary | ICD-10-CM

## 2023-03-28 DIAGNOSIS — R197 Diarrhea, unspecified: Secondary | ICD-10-CM

## 2023-03-28 LAB — COMPREHENSIVE METABOLIC PANEL
ALT: 80 U/L — ABNORMAL HIGH (ref 0–44)
AST: 73 U/L — ABNORMAL HIGH (ref 15–41)
Albumin: 4.3 g/dL (ref 3.5–5.0)
Alkaline Phosphatase: 47 U/L (ref 38–126)
Anion gap: 18 — ABNORMAL HIGH (ref 5–15)
BUN: 13 mg/dL (ref 6–20)
CO2: 23 mmol/L (ref 22–32)
Calcium: 9.8 mg/dL (ref 8.9–10.3)
Chloride: 92 mmol/L — ABNORMAL LOW (ref 98–111)
Creatinine, Ser: 1.95 mg/dL — ABNORMAL HIGH (ref 0.61–1.24)
GFR, Estimated: 40 mL/min — ABNORMAL LOW (ref >60)
Glucose, Bld: 189 mg/dL — ABNORMAL HIGH (ref 70–99)
Potassium: 3.2 mmol/L — ABNORMAL LOW (ref 3.5–5.1)
Sodium: 133 mmol/L — ABNORMAL LOW (ref 135–145)
Total Bilirubin: 1 mg/dL (ref 0.3–1.2)
Total Protein: 7.9 g/dL (ref 6.5–8.1)

## 2023-03-28 LAB — CBC
HCT: 51.6 % (ref 39.0–52.0)
Hemoglobin: 17.8 g/dL — ABNORMAL HIGH (ref 13.0–17.0)
MCH: 31 pg (ref 26.0–34.0)
MCHC: 34.5 g/dL (ref 30.0–36.0)
MCV: 89.9 fL (ref 80.0–100.0)
Platelets: 309 10*3/uL (ref 150–400)
RBC: 5.74 MIL/uL (ref 4.22–5.81)
RDW: 13 % (ref 11.5–15.5)
WBC: 15 10*3/uL — ABNORMAL HIGH (ref 4.0–10.5)
nRBC: 0 % (ref 0.0–0.2)

## 2023-03-28 LAB — URINALYSIS, ROUTINE W REFLEX MICROSCOPIC
Cellular Cast, UA: 5
Glucose, UA: 50 mg/dL — AB
Ketones, ur: NEGATIVE mg/dL
Leukocytes,Ua: NEGATIVE
Nitrite: NEGATIVE
Protein, ur: >=300 mg/dL — AB
Specific Gravity, Urine: 1.027 (ref 1.005–1.030)
pH: 5 (ref 5.0–8.0)

## 2023-03-28 LAB — MAGNESIUM: Magnesium: 1.6 mg/dL — ABNORMAL LOW (ref 1.7–2.4)

## 2023-03-28 LAB — LACTIC ACID, PLASMA: Lactic Acid, Venous: 2.6 mmol/L (ref 0.5–1.9)

## 2023-03-28 LAB — LIPASE, BLOOD: Lipase: 36 U/L (ref 11–51)

## 2023-03-28 LAB — TROPONIN I (HIGH SENSITIVITY)
Troponin I (High Sensitivity): 17 ng/L (ref <18)
Troponin I (High Sensitivity): 17 ng/L (ref <18)

## 2023-03-28 LAB — GLUCOSE, CAPILLARY: Glucose-Capillary: 160 mg/dL — ABNORMAL HIGH (ref 70–99)

## 2023-03-28 MED ORDER — MELATONIN 3 MG PO TABS: 3.0000 mg | ORAL_TABLET | Freq: Every evening | ORAL | Status: DC | PRN

## 2023-03-28 MED ORDER — ACETAMINOPHEN 325 MG PO TABS: 650.0000 mg | ORAL_TABLET | Freq: Four times a day (QID) | ORAL | Status: DC | PRN

## 2023-03-28 MED ORDER — MAGNESIUM SULFATE 2 GM/50ML IV SOLN
2.0000 g | Freq: Once | INTRAVENOUS | Status: AC
  Administered 2023-03-28: 2 g via INTRAVENOUS
  Filled 2023-03-28: qty 50

## 2023-03-28 MED ORDER — HYDROMORPHONE HCL 1 MG/ML IJ SOLN
0.5000 mg | INTRAMUSCULAR | Status: DC | PRN
  Administered 2023-03-28 – 2023-03-29 (×2): 0.5 mg via INTRAVENOUS
  Filled 2023-03-28 (×2): qty 0.5

## 2023-03-28 MED ORDER — SODIUM CHLORIDE 0.9 % IV BOLUS
1000.0000 mL | Freq: Once | INTRAVENOUS | Status: AC
  Administered 2023-03-28: 1000 mL via INTRAVENOUS

## 2023-03-28 MED ORDER — IOHEXOL 350 MG/ML SOLN
60.0000 mL | Freq: Once | INTRAVENOUS | Status: AC | PRN
  Administered 2023-03-28: 60 mL via INTRAVENOUS

## 2023-03-28 MED ORDER — PROCHLORPERAZINE EDISYLATE 10 MG/2ML IJ SOLN
5.0000 mg | Freq: Four times a day (QID) | INTRAMUSCULAR | Status: DC | PRN
  Administered 2023-03-28: 5 mg via INTRAVENOUS
  Filled 2023-03-28: qty 2

## 2023-03-28 MED ORDER — INSULIN ASPART 100 UNIT/ML IJ SOLN: 0.0000 [IU] | Freq: Every day | INTRAMUSCULAR | Status: DC

## 2023-03-28 MED ORDER — FENTANYL CITRATE PF 50 MCG/ML IJ SOSY
50.0000 ug | PREFILLED_SYRINGE | Freq: Once | INTRAMUSCULAR | Status: AC
  Administered 2023-03-28: 50 ug via INTRAVENOUS
  Filled 2023-03-28: qty 1

## 2023-03-28 MED ORDER — POLYETHYLENE GLYCOL 3350 17 G PO PACK: 17.0000 g | PACK | Freq: Every day | ORAL | Status: DC | PRN

## 2023-03-28 MED ORDER — SODIUM CHLORIDE 0.9 % IV SOLN
1.0000 g | Freq: Once | INTRAVENOUS | Status: AC
  Administered 2023-03-28: 1 g via INTRAVENOUS
  Filled 2023-03-28: qty 10

## 2023-03-28 MED ORDER — IOHEXOL 350 MG/ML SOLN
75.0000 mL | Freq: Once | INTRAVENOUS | Status: AC | PRN
  Administered 2023-03-28: 75 mL via INTRAVENOUS

## 2023-03-28 MED ORDER — OXYCODONE HCL 5 MG PO TABS
5.0000 mg | ORAL_TABLET | ORAL | Status: DC | PRN
  Administered 2023-03-29: 5 mg via ORAL
  Filled 2023-03-28: qty 1

## 2023-03-28 MED ORDER — INSULIN ASPART 100 UNIT/ML IJ SOLN
0.0000 [IU] | Freq: Three times a day (TID) | INTRAMUSCULAR | Status: DC
  Administered 2023-03-29: 1 [IU] via SUBCUTANEOUS
  Administered 2023-03-29: 3 [IU] via SUBCUTANEOUS

## 2023-03-28 MED ORDER — POTASSIUM CHLORIDE CRYS ER 20 MEQ PO TBCR
40.0000 meq | EXTENDED_RELEASE_TABLET | Freq: Once | ORAL | Status: AC
  Administered 2023-03-28: 40 meq via ORAL
  Filled 2023-03-28: qty 2

## 2023-03-28 NOTE — ED Provider Notes (Signed)
[facility] AT [facility] Provider Note   CSN: [id number] Arrival date & time: 03/28/23  1455     History  Chief Complaint  Patient presents with   Abdominal Pain    Gerald Simpson is a 57 y.o. male.   Abdominal Pain    89 old male with medical history significant for MI/CAD, HTN, HLD, chronic back pain, DM 2 who presents to the emergency department with abdominal pain.  The patient states that he has had left upper quadrant abdominal pain and left-sided left lower quadrant abdominal pain. States that his abdominal pain is ripping and tearing and radiates to his back.  Pain started this morning at his own 0 500.  He has had 3 episodes of NBNB emesis.  He has also had roughly 24 hours of bloody diarrhea.  He endorses epigastric discomfort that radiates to his chest.  He has a history of multiple MIs.  He does state that it feels similar to previous episodes of pancreatitis.  He denies any shortness of breath, cough, fever or chills.  Home Medications Prior to Admission medications   Medication Sig Start Date End Date Taking? Authorizing Provider  albuterol (PROVENTIL) (2.5 MG/3ML) 0.083% nebulizer solution Take 2.5 mg by nebulization daily as needed for wheezing or shortness of breath. Patient not taking: Reported on 08/05/2022    [provider]  allopurinol (ZYLOPRIM) 100 MG tablet Take 1 tablet (100 mg total) by mouth daily. 08/06/22   Marrianne Mood, MD  atorvastatin (LIPITOR) 40 MG tablet Take 40 mg by mouth every morning. 07/04/22   [provider]  colchicine 0.6 MG tablet Take 1 tablet (0.6 mg total) by mouth daily. 02/20/23   Schutt, Edsel Petrin, PA-C  ENTRESTO 24-26 MG Take 1 tablet by mouth 2 (two) times daily. 10/13/19   [provider]  furosemide (LASIX) 20 MG tablet Take 20 mg by mouth daily. 10/13/19   [provider]  HYDROcodone-acetaminophen (NORCO/VICODIN) 5-325 MG tablet Take 1 tablet by mouth every 6  (six) hours as needed. 02/20/23   Schutt, Edsel Petrin, PA-C  lidocaine (LIDODERM) 5 % Place 1 patch onto the skin daily. Remove & Discard patch within 12 hours or as directed by MD Patient not taking: Reported on 08/05/2022 08/18/20   Couture, Cortni S, PA-C  loratadine (CLARITIN) 10 MG tablet Take 10 mg by mouth daily. 07/04/22   [provider]  metFORMIN (GLUCOPHAGE) 1000 MG tablet Take 1,000 mg by mouth 2 (two) times daily. 10/13/19   [provider]  metoprolol tartrate (LOPRESSOR) 50 MG tablet Take 50 mg by mouth 2 (two) times daily.    [provider]  montelukast (SINGULAIR) 10 MG tablet Take 10 mg by mouth every evening.    [provider]  nitroGLYCERIN (NITROSTAT) 0.4 MG SL tablet Place 0.4 mg under the tongue every 5 (five) minutes as needed for chest pain.     [provider]  spironolactone (ALDACTONE) 25 MG tablet Take 25 mg by mouth every morning. 07/04/22   [provider]  ticagrelor (BRILINTA) 60 MG TABS tablet Take 1 tablet (60 mg total) by mouth 2 (two) times daily. 08/06/22   Marrianne Mood, MD  TRELEGY ELLIPTA 100-62.5-25 MCG/ACT AEPB Inhale 1 puff into the lungs daily. Patient not taking: Reported on 08/05/2022 07/04/22   [provider]  Vitamin D, Ergocalciferol, (DRISDOL) 1.25 MG (50000 UNIT) CAPS capsule Take 50,000 Units by mouth every 7 (seven) days. 05/04/22   [provider]      Allergies    Other, Tramadol, Isosorbide nitrate, and Morphine and codeine    Review of Systems   Review of Systems  Gastrointestinal:  Positive for abdominal pain.  All other systems reviewed and are negative.   Physical Exam Updated Vital Signs BP (!) 147/86 (BP Location: Right Arm)   Pulse (!) 59   Temp 98.4 F (36.9 C) (Oral)   Resp 18   Ht 5\' 10"  (1.778 m)   Wt 122.9 kg   SpO2 99%   BMI 38.88 kg/m  Physical Exam Vitals and nursing note reviewed.  Constitutional:      General: He is not in acute  distress.    Appearance: He is well-developed.  HENT:     Head: Normocephalic and atraumatic.  Eyes:     Conjunctiva/sclera: Conjunctivae normal.  Cardiovascular:     Rate and Rhythm: Normal rate and regular rhythm.     Heart sounds: No murmur heard. Pulmonary:     Effort: Pulmonary effort is normal. No respiratory distress.     Breath sounds: Normal breath sounds.  Abdominal:     Palpations: Abdomen is soft.     Tenderness: There is abdominal tenderness in the left upper quadrant and left lower quadrant. There is guarding.  Musculoskeletal:        General: No swelling.     Cervical back: Neck supple.  Skin:    General: Skin is warm and dry.     Capillary Refill: Capillary refill takes less than 2 seconds.  Neurological:     Mental Status: He is alert.  Psychiatric:        Mood and Affect: Mood normal.     ED Results / Procedures / Treatments   Labs (all labs ordered are listed, but only abnormal results are displayed) Labs Reviewed  COMPREHENSIVE METABOLIC PANEL - Abnormal; Notable for the following components:      Result Value   Sodium 133 (*)    Potassium 3.2 (*)    Chloride 92 (*)    Glucose, Bld 189 (*)    Creatinine, Ser 1.95 (*)    AST 73 (*)    ALT 80 (*)    GFR, Estimated 40 (*)    Anion gap 18 (*)    All other components within normal limits  CBC - Abnormal; Notable for the following components:   WBC 15.0 (*)    Hemoglobin 17.8 (*)    All other components within normal limits  URINALYSIS, ROUTINE W REFLEX MICROSCOPIC - Abnormal; Notable for the following components:   Color, Urine AMBER (*)    APPearance CLOUDY (*)    Glucose, UA 50 (*)    Hgb urine dipstick SMALL (*)    Bilirubin Urine MODERATE (*)    Protein, ur >=300 (*)    Bacteria, UA RARE (*)    Non Squamous Epithelial 0-5 (*)    All other components within normal limits  GASTROINTESTINAL PANEL BY PCR, STOOL (REPLACES STOOL CULTURE)  C DIFFICILE QUICK SCREEN W PCR REFLEX    LIPASE, BLOOD   LACTIC ACID, PLASMA  LACTIC ACID, PLASMA  TROPONIN I (HIGH SENSITIVITY)  TROPONIN I (HIGH SENSITIVITY)    EKG EKG Interpretation  Date/Time:  Wednesday Mar 28 2023 15:03:28 EDT Ventricular Rate:  63 PR Interval:  164 QRS Duration: 80 QT Interval:  452 QTC Calculation: 462 R Axis:   -43 Text Interpretation: Normal sinus rhythm Left axis deviation Inferior infarct , age undetermined Anteroseptal  infarct , age undetermined Abnormal ECG When compared with ECG of 04-Aug-2022 23:31, PREVIOUS ECG IS PRESENT Confirmed by Ernie Avena ([id number]) on 03/28/2023 5:41:59 PM  Radiology CT ABDOMEN PELVIS W CONTRAST  Result Date: 03/28/2023 CLINICAL DATA:  Abdominal pain EXAM: CT ABDOMEN AND PELVIS WITH CONTRAST TECHNIQUE: Multidetector CT imaging of the abdomen and pelvis was performed using the standard protocol following bolus administration of intravenous contrast. RADIATION DOSE REDUCTION: This exam was performed according to the departmental dose-optimization program which includes automated exposure control, adjustment of the mA and/or kV according to patient size and/or use of iterative reconstruction technique. CONTRAST:  60mL OMNIPAQUE IOHEXOL 350 MG/ML SOLN COMPARISON:  Hepatobiliary ultrasound of 08/05/2022 and CT exams of 08/05/2022 and 06/09/2021 FINDINGS: Lower chest: Fat density along the left ventricular apex and septum with thinning of the associated left ventricular wall compatible with prior infarct. AICD leads noted. Circumflex and left anterior descending coronary artery atherosclerotic vascular disease. Airway thickening is noted at the lung bases, particularly in both lower lobes. Hepatobiliary: Fluid density 1.0 cm structure in the right hepatic lobe on image 27 series 3 adjacent to the gallbladder is unchanged from 08/05/2022 and compatible with a small cyst. No further imaging workup of this lesion is indicated. The liver and biliary tree appear otherwise unremarkable. Pancreas:  Unremarkable Spleen: Unremarkable Adrenals/Urinary Tract: 1.7 by 1.6 cm Bosniak category 1 cyst of the right kidney lower pole on image 43 series 3. No further imaging workup of this lesion is indicated. The adrenal glands in urinary bladder appear unremarkable. Stomach/Bowel: Mild sigmoid colon diverticulosis. No findings of active diverticulitis. No dilated bowel. Normal appendix. Vascular/Lymphatic: Mild atherosclerotic vascular disease including the abdominal aorta. Reproductive: Fluid density structure along the right spermatic cord on image 96 series 3, possibly a hydrocele of the spermatic cord. Other: Previous nodularity along the left omentum is substantially reduced in size and conspicuity today, currently measuring 5 by 3 mm on image 35 series 3, and accordingly this is likely postinflammatory related to previous fat necrosis. Musculoskeletal: Potential mild right foraminal stenosis at L4-5 due to facet arthropathy and degenerative disc disease. The upper portion of the left rectus abdominus muscle appears substantially contracted compared to the 08/05/2022, resulting in differential increased anterior-posterior thickness (2.3 cm today compared to 1.1 cm previously) but also reduced vertical excursion (4.3 cm today compared to 9.4 cm previously). Presumably this could be due to spasm of this portion of the rectus abdominus muscle; I do not see a discrete mass within the muscle or significant heterogeneous density within the muscle. IMPRESSION: 1. Apparent spasm or localized contraction of the left upper rectus abdominus muscle, cause uncertain. No intramuscular lesion is observed. 2. Airway thickening is noted at the lung bases, particularly in both lower lobes, suggesting bronchitis or reactive airways disease. 3. Coronary atherosclerosis with prior left ventricular infarct. 4. Mild sigmoid colon diverticulosis. 5. Fluid density structure along the right spermatic cord, possibly a hydrocele of the  spermatic cord. 6. Potential mild right foraminal stenosis at L4-5 due to facet arthropathy and degenerative disc disease. Aortic Atherosclerosis (ICD10-I70.0). Electronically Signed   By: Gaylyn Rong M.D.   On: 03/28/2023 17:36    Procedures Procedures    Medications Ordered in ED Medications  potassium chloride SA (KLOR-CON M) CR tablet 40 mEq (40 mEq Oral Given 03/28/23 1811)  sodium chloride 0.9 % bolus 1,000 mL (1,000 mLs Intravenous New Bag/Given 03/28/23 1811)  iohexol (OMNIPAQUE) 350 MG/ML injection 60 mL (60 mLs Intravenous Contrast Given 03/28/23  1703)  fentaNYL (SUBLIMAZE) injection 50 mcg (50 mcg Intravenous Given 03/28/23 1812)  cefTRIAXone (ROCEPHIN) 1 g in sodium chloride 0.9 % 100 mL IVPB (1 g Intravenous New Bag/Given 03/28/23 1812)  fentaNYL (SUBLIMAZE) injection 50 mcg (50 mcg Intravenous Given 03/28/23 1926)  iohexol (OMNIPAQUE) 350 MG/ML injection 75 mL (75 mLs Intravenous Contrast Given 03/28/23 2015)    ED Course/ Medical Decision Making/ A&P Clinical Course as of 03/28/23 2030  Wed Mar 28, 2023  1741 WBC(!): 15.0 [JL]  1741 Creatinine(!): 1.95 [JL]  1741 Bacteria, UA(!): RARE [JL]  1741 WBC, UA: 11-20 [JL]    Clinical Course User Index [JL] Ernie Avena, MD                             Medical Decision Making Amount and/or Complexity of Data Reviewed Labs: ordered. Decision-making details documented in ED Course. Radiology: ordered.  Risk Prescription drug management.    46 old male with medical history significant for MI/CAD, HTN, HLD, chronic back pain, DM 2 who presents to the emergency department with abdominal pain.  The patient states that he has had left upper quadrant abdominal pain and left-sided left lower quadrant abdominal pain.  States that his abdominal pain is ripping and tearing and radiates to his back. Pain started this morning at his own 0 500.  He has had 3 episodes of NBNB emesis.  He has also had roughly 24 hours of bloody  diarrhea.  He endorses epigastric discomfort that radiates to his chest.  He has a history of multiple MIs.  He does state that it feels similar to previous episodes of pancreatitis.  He denies any shortness of breath, cough, fever or chills.  On arrival, the patient was afebrile, bradycardic heart rate 59, BP 105/76, saturating 100% on room air.  Sinus rhythm noted on cardiac telemetry.  Physical exam significant for left upper quadrant left lower quadrant tenderness to palpation, guarding present.  Differential diagnosis includes colitis, infectious versus ischemic, small bowel obstruction, gastroenteritis, aortic dissection, ACS, pancreatitis, UTI/pyelonephritis/nephrolithiasis, diverticulitis.  Initial EKG revealed sinus rhythm, ventricular rate 63, left axis deviation with an inferior infarct age-indeterminate present, Q waves present inferiorly and anteroseptally, no STEMI.  CT abdomen pelvis ordered in triage was performed which revealed the following: IMPRESSION:  1. Apparent spasm or localized contraction of the left upper rectus  abdominus muscle, cause uncertain. No intramuscular lesion is  observed.  2. Airway thickening is noted at the lung bases, particularly in  both lower lobes, suggesting bronchitis or reactive airways disease.  3. Coronary atherosclerosis with prior left ventricular infarct.  4. Mild sigmoid colon diverticulosis.  5. Fluid density structure along the right spermatic cord, possibly  a hydrocele of the spermatic cord.  6. Potential mild right foraminal stenosis at L4-5 due to facet  arthropathy and degenerative disc disease.   Laboratory evaluation significant for lipase 36, troponin 17, CBC with a leukocytosis to 15, hemoconcentration with a hemoglobin of 17.8, CMP with hypokalemia to 3.2, evidence of an AKI with a serum creatinine of 1.95, slight of 0.92, evidence of elevated LFTs with an AST of 73, ALT of 80.  Went   {Document critical care time when  appropriate:1} {Document review of labs and clinical decision tools ie heart score, Chads2Vasc2 etc:1}  {Document your independent review of radiology images, and any outside records:1} {Document your discussion with family members, caretakers, and with consultants:1} {Document social determinants of health affecting pt's  care:1} {Document your decision making why or why not admission, treatments were needed:1} Final Clinical Impression(s) / ED Diagnoses Final diagnoses:  AKI (acute kidney injury) (HCC)    Rx / DC Orders ED Discharge Orders     None
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
## 2023-03-28 NOTE — ED Triage Notes (Addendum)
Pt bib ems from home with abdominal pain, mostly LUQ some in the RUQ. Pain onset this morning at 0500. Tenderness and guarding to LUQ. Endorses 3 episodes of emesis today along with bloody diarrhea X24 hours ago. Pt given 4mg  Zofran en route.
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
## 2023-03-28 NOTE — Progress Notes (Signed)
Pt. Admitted to floor by ED. Pt oriented to room and bed control. Pt sitting on side of bed talking. Pt req food. NT prepared TV dinner for pt.  Pt care ongoing.

## 2023-03-29 DIAGNOSIS — K529 Noninfective gastroenteritis and colitis, unspecified: Secondary | ICD-10-CM | POA: Diagnosis not present

## 2023-03-29 DIAGNOSIS — E119 Type 2 diabetes mellitus without complications: Secondary | ICD-10-CM | POA: Diagnosis not present

## 2023-03-29 DIAGNOSIS — R109 Unspecified abdominal pain: Secondary | ICD-10-CM | POA: Diagnosis not present

## 2023-03-29 LAB — LACTIC ACID, PLASMA: Lactic Acid, Venous: 2.8 mmol/L (ref 0.5–1.9)

## 2023-03-29 LAB — BASIC METABOLIC PANEL
Anion gap: 13 (ref 5–15)
BUN: 13 mg/dL (ref 6–20)
CO2: 22 mmol/L (ref 22–32)
Calcium: 9.3 mg/dL (ref 8.9–10.3)
Chloride: 98 mmol/L (ref 98–111)
Creatinine, Ser: 1.21 mg/dL (ref 0.61–1.24)
GFR, Estimated: >60 mL/min (ref >60)
Glucose, Bld: 186 mg/dL — ABNORMAL HIGH (ref 70–99)
Potassium: 3.6 mmol/L (ref 3.5–5.1)
Sodium: 133 mmol/L — ABNORMAL LOW (ref 135–145)

## 2023-03-29 LAB — CBC
HCT: 48.2 % (ref 39.0–52.0)
Hemoglobin: 16.7 g/dL (ref 13.0–17.0)
MCH: 30.9 pg (ref 26.0–34.0)
MCHC: 34.6 g/dL (ref 30.0–36.0)
MCV: 89.1 fL (ref 80.0–100.0)
Platelets: 295 10*3/uL (ref 150–400)
RBC: 5.41 MIL/uL (ref 4.22–5.81)
RDW: 13 % (ref 11.5–15.5)
WBC: 9.4 10*3/uL (ref 4.0–10.5)
nRBC: 0 % (ref 0.0–0.2)

## 2023-03-29 LAB — GLUCOSE, CAPILLARY
Glucose-Capillary: 143 mg/dL — ABNORMAL HIGH (ref 70–99)
Glucose-Capillary: 228 mg/dL — ABNORMAL HIGH (ref 70–99)

## 2023-03-29 MED ORDER — HYDROCORTISONE ACETATE 25 MG RE SUPP: 25.0000 mg | Freq: Two times a day (BID) | RECTAL | 0 refills | Status: AC

## 2023-03-29 MED ORDER — DICYCLOMINE HCL 10 MG PO CAPS: 10.0000 mg | ORAL_CAPSULE | Freq: Three times a day (TID) | ORAL | 0 refills | Status: AC | PRN

## 2023-03-29 MED ORDER — ONDANSETRON HCL 4 MG PO TABS: 4.0000 mg | ORAL_TABLET | Freq: Every day | ORAL | 0 refills | Status: AC | PRN

## 2023-03-29 NOTE — Progress Notes (Signed)
DC paperwork given to the patient and he stated understanding. IV has been removed and medications escribed to home pharmacy

## 2023-03-29 NOTE — Discharge Summary (Addendum)
Physician Discharge Summary  Gerald Simpson ZOX:[id number] DOB: 01-06-1966 DOA: 03/28/2023  PCP: Pcp, No  Admit date: 03/28/2023 Discharge date: 03/29/2023  Admitted From: Home  Discharge disposition: Home   Recommendations for Outpatient Follow-Up:   Follow up with your primary care provider in one week.  Check CBC, BMP, magnesium in the next visit Patient had some bleeding per rectum while wiping.  Likely hemorrhoidal.  Hemorrhoidal suppositories has been prescribed.  Might need outpatient follow-up for elective colonoscopy.   Discharge Diagnosis:   Principal Problem:   Abdominal pain Active Problems:   Diabetes mellitus without complication (HCC)   Acute gastroenteritis   Discharge Condition: Improved.  Diet recommendation: Low sodium, heart healthy.  Carbohydrate-modified.    Wound care: None.  Code status: Full.   History of Present Illness:   Gerald Simpson is a 57 y.o. male with medical history significant for CAD status post PCI and AICD placement, HFrEF 35-40%, gout, type 2 diabetes, hypertension, hyperlipidemia, reactive airway disease, presented to hospital with intermittent severe abdominal pain associated with loose bloody stools nausea vomiting.  In the ED, vitals were stable.labs showed mild hyponatremia at 133 with hypokalemia at 3.2 with elevated creatinine at 1.9 and low magnesium at 1.6 with elevated AST ALT at 73 and 80 respectively. CT angio chest/abdomen/pelvis dissection with and without contrast revealed no acute intrathoracic, abdominal or pelvic pathology.  No aortic aneurysm or dissection.    Mild sigmoid diverticulosis without active inflammatory changes.  No bowel obstruction.  Normal appendix.  There was some concern for possible infective etiology and was given Rocephin.  Patient however stated that his pain feels like his previous history of pancreatitis but  lipase was negative.  CT scan did not show any evidence of acute pancreatitis.   Hospital  Course:   Following conditions were addressed during hospitalization as listed below,  Abdominal pain nausea vomiting with hematochezia. CT scan of the abdomen and pelvis without any acute findings.  Will continue to monitor closely.  Lipase of 36.    Urinalysis showed protein in urine but 11-20 white cells with negative nitrate.  Patient does not have urinary symptoms.  Appears that rectal bleed was hemorrhoidal on wiping.  Will prescribe Anusol suppositories on discharge.  GI pathogen panel and C. difficile was negative..  Patient has not had a bowel movement since yesterday morning.  Hemoglobin today at 16.7   Leukocytosis, Elevated lactate.  Secondary to volume depletion.  Leukocytosis has normalized at this time.  Erythrocytosis Hemoglobin of 17.8 initially.  Hemoglobin today at 16.7.  Will need outpatient follow-up.  Hypokalemia Initial serum potassium was 3.2.  Has been replenished.  Potassium prior to discharge was 3.6.   Hypomagnesemia Initial serum magnesium level of 1.6.  Has been replenished.   Prediabetes with hyperglycemia Last hemoglobin A1c 5.9 on 01/11/2022.  Hemoglobin A1c pending.  Advise diabetic diet.   Obesity Body mass index is 38.88 kg/m.  Would benefit from an going weight loss as outpatient   Chronic systolic heart failure with known ejection fraction of 35 to 40% Currently compensated.   Disposition.  At this time, patient is stable for disposition home with outpatient PCP follow-up in 1 week  Medical Consultants:   None.  Procedures:    None Subjective:   Today, patient was seen and examined at bedside.  Feels okay.  Has not had a bowel movement since yesterday.  Has tolerated oral diet.  Abdominal discomfort but otherwise okay.  States that he had eaten some  unusual food at home this could be contributing to it.  Complains of some blood while wiping but no gross red blood.  Discharge Exam:   Vitals:   03/29/23 0232 03/29/23 0742  BP: (!) 98/53  129/83  Pulse: 60 74  Resp: 17 20  Temp: 97.6 F (36.4 C) 97.8 F (36.6 C)  SpO2: 95% 95%   Vitals:   03/28/23 2030 03/28/23 2203 03/29/23 0232 03/29/23 0742  BP: (!) 147/95 (!) 147/93 (!) 98/53 129/83  Pulse: 60 60 60 74  Resp: 18 (!) 21 17 20   Temp:  97.8 F (36.6 C) 97.6 F (36.4 C) 97.8 F (36.6 C)  TempSrc:  Oral Oral Oral  SpO2: 99% 100% 95% 95%  Weight:      Height:       Body mass index is 38.88 kg/m.   General: Alert awake, not in obvious distress, obese HENT: pupils equally reacting to light,  No scleral pallor or icterus noted. Oral mucosa is moist.  Chest:  Clear breath sounds.  . No crackles or wheezes.  CVS: S1 &S2 heard. No murmur.  Regular rate and rhythm. Abdomen: Soft, nontender, nondistended.  Bowel sounds are heard.   Extremities: No cyanosis, clubbing or edema.  Peripheral pulses are palpable. Psych: Alert, awake and oriented, normal mood CNS:  No cranial nerve deficits.  Power equal in all extremities.   Skin: Warm and dry.  No rashes noted.  The results of significant diagnostics from this hospitalization (including imaging, microbiology, ancillary and laboratory) are listed below for reference.     Diagnostic Studies:   CT Angio Chest/Abd/Pel for Dissection W and/or Wo Contrast  Result Date: 03/28/2023 CLINICAL DATA:  Acute aortic syndrome suspected. EXAM: CT ANGIOGRAPHY CHEST, ABDOMEN AND PELVIS TECHNIQUE: Non-contrast CT of the chest was initially obtained. Multidetector CT imaging through the chest, abdomen and pelvis was performed using the standard protocol during bolus administration of intravenous contrast. Multiplanar reconstructed images and MIPs were obtained and reviewed to evaluate the vascular anatomy. RADIATION DOSE REDUCTION: This exam was performed according to the departmental dose-optimization program which includes automated exposure control, adjustment of the mA and/or kV according to patient size and/or use of iterative  reconstruction technique. CONTRAST:  75mL OMNIPAQUE IOHEXOL 350 MG/ML SOLN COMPARISON:  CT abdomen pelvis dated 03/28/2023. FINDINGS: Evaluation is limited due to streak artifact caused by patient's arms. CTA CHEST FINDINGS Cardiovascular: There is no cardiomegaly or pericardial effusion. Evidence of prior left ventricular infarct. Advanced coronary vascular calcification of the LAD and left coronary artery. Left pectoral pacemaker device. There is mild atherosclerotic calcification of the aortic arch. No aneurysmal dilatation or dissection. The origins of the great vessels of the aortic arch appear patent. No pulmonary artery embolus identified. Mediastinum/Nodes: No hilar or mediastinal adenopathy. The esophagus is grossly unremarkable. No mediastinal fluid collection. Lungs/Pleura: The lungs are clear. There is no pleural effusion or pneumothorax. The central airways are patent. Musculoskeletal: Left pectoral pacemaker device. No acute osseous pathology. Review of the MIP images confirms the above findings. CTA ABDOMEN AND PELVIS FINDINGS VASCULAR Aorta: Minimal atherosclerotic calcification. No aneurysmal dilatation or dissection. No periaortic fluid collection. Celiac: Patent without evidence of aneurysm, dissection, vasculitis or significant stenosis. SMA: Patent without evidence of aneurysm, dissection, vasculitis or significant stenosis. Renals: Both renal arteries are patent without evidence of aneurysm, dissection, vasculitis, fibromuscular dysplasia or significant stenosis. IMA: Patent without evidence of aneurysm, dissection, vasculitis or significant stenosis. Inflow: Patent without evidence of aneurysm, dissection, vasculitis or significant stenosis.  Veins: No obvious venous abnormality within the limitations of this arterial phase study. Review of the MIP images confirms the above findings. NON-VASCULAR No intra-abdominal free air or free fluid. Hepatobiliary: The liver is unremarkable. No biliary  hypertension. The gallbladder is unremarkable. Pancreas: Unremarkable. No pancreatic ductal dilatation or surrounding inflammatory changes. Spleen: Normal in size without focal abnormality. Adrenals/Urinary Tract: The adrenal glands are unremarkable. Small right renal inferior pole cyst. No imaging follow-up. There is no hydronephrosis on either side. The visualized ureters appear unremarkable. The urinary bladder is partially distended with excreted contrast and grossly unremarkable. Stomach/Bowel: Mild sigmoid diverticulosis without active inflammatory changes. There is no bowel obstruction or active inflammation. The appendix is normal. Lymphatic: No adenopathy. Reproductive: The prostate and seminal vesicles are grossly remarkable. No pelvic mass. Other: None Musculoskeletal: No acute or significant osseous findings. Review of the MIP images confirms the above findings. IMPRESSION: 1. No acute intrathoracic, abdominal, or pelvic pathology. No aortic aneurysm or dissection. 2. Advanced coronary vascular calcification of the LAD and left coronary artery. 3. Mild sigmoid diverticulosis without active inflammatory changes. No bowel obstruction. Normal appendix. Electronically Signed   By: Elgie Collard M.D.   On: 03/28/2023 20:28   CT ABDOMEN PELVIS W CONTRAST  Result Date: 03/28/2023 CLINICAL DATA:  Abdominal pain EXAM: CT ABDOMEN AND PELVIS WITH CONTRAST TECHNIQUE: Multidetector CT imaging of the abdomen and pelvis was performed using the standard protocol following bolus administration of intravenous contrast. RADIATION DOSE REDUCTION: This exam was performed according to the departmental dose-optimization program which includes automated exposure control, adjustment of the mA and/or kV according to patient size and/or use of iterative reconstruction technique. CONTRAST:  60mL OMNIPAQUE IOHEXOL 350 MG/ML SOLN COMPARISON:  Hepatobiliary ultrasound of 08/05/2022 and CT exams of 08/05/2022 and 06/09/2021  FINDINGS: Lower chest: Fat density along the left ventricular apex and septum with thinning of the associated left ventricular wall compatible with prior infarct. AICD leads noted. Circumflex and left anterior descending coronary artery atherosclerotic vascular disease. Airway thickening is noted at the lung bases, particularly in both lower lobes. Hepatobiliary: Fluid density 1.0 cm structure in the right hepatic lobe on image 27 series 3 adjacent to the gallbladder is unchanged from 08/05/2022 and compatible with a small cyst. No further imaging workup of this lesion is indicated. The liver and biliary tree appear otherwise unremarkable. Pancreas: Unremarkable Spleen: Unremarkable Adrenals/Urinary Tract: 1.7 by 1.6 cm Bosniak category 1 cyst of the right kidney lower pole on image 43 series 3. No further imaging workup of this lesion is indicated. The adrenal glands in urinary bladder appear unremarkable. Stomach/Bowel: Mild sigmoid colon diverticulosis. No findings of active diverticulitis. No dilated bowel. Normal appendix. Vascular/Lymphatic: Mild atherosclerotic vascular disease including the abdominal aorta. Reproductive: Fluid density structure along the right spermatic cord on image 96 series 3, possibly a hydrocele of the spermatic cord. Other: Previous nodularity along the left omentum is substantially reduced in size and conspicuity today, currently measuring 5 by 3 mm on image 35 series 3, and accordingly this is likely postinflammatory related to previous fat necrosis. Musculoskeletal: Potential mild right foraminal stenosis at L4-5 due to facet arthropathy and degenerative disc disease. The upper portion of the left rectus abdominus muscle appears substantially contracted compared to the 08/05/2022, resulting in differential increased anterior-posterior thickness (2.3 cm today compared to 1.1 cm previously) but also reduced vertical excursion (4.3 cm today compared to 9.4 cm previously). Presumably  this could be due to spasm of this portion of the rectus  abdominus muscle; I do not see a discrete mass within the muscle or significant heterogeneous density within the muscle. IMPRESSION: 1. Apparent spasm or localized contraction of the left upper rectus abdominus muscle, cause uncertain. No intramuscular lesion is observed. 2. Airway thickening is noted at the lung bases, particularly in both lower lobes, suggesting bronchitis or reactive airways disease. 3. Coronary atherosclerosis with prior left ventricular infarct. 4. Mild sigmoid colon diverticulosis. 5. Fluid density structure along the right spermatic cord, possibly a hydrocele of the spermatic cord. 6. Potential mild right foraminal stenosis at L4-5 due to facet arthropathy and degenerative disc disease. Aortic Atherosclerosis (ICD10-I70.0). Electronically Signed   By: Gaylyn Rong M.D.   On: 03/28/2023 17:36     Labs:   Basic Metabolic Panel: Recent Labs  Lab 03/28/23 1510 03/28/23 2043 03/29/23 1128  NA 133*  --  133*  K 3.2*  --  3.6  CL 92*  --  98  CO2 23  --  22  GLUCOSE 189*  --  186*  BUN 13  --  13  CREATININE 1.95*  --  1.21  CALCIUM 9.8  --  9.3  MG  --  1.6*  --    GFR Estimated Creatinine Clearance: 90.7 mL/min (by C-G formula based on SCr of 1.21 mg/dL). Liver Function Tests: Recent Labs  Lab 03/28/23 1510  AST 73*  ALT 80*  ALKPHOS 47  BILITOT 1.0  PROT 7.9  ALBUMIN 4.3   Recent Labs  Lab 03/28/23 1510  LIPASE 36   No results for input(s): "AMMONIA" in the last 168 hours. Coagulation profile No results for input(s): "INR", "PROTIME" in the last 168 hours.  CBC: Recent Labs  Lab 03/28/23 1510 03/29/23 1128  WBC 15.0* 9.4  HGB 17.8* 16.7  HCT 51.6 48.2  MCV 89.9 89.1  PLT 309 295   Cardiac Enzymes: No results for input(s): "CKTOTAL", "CKMB", "CKMBINDEX", "TROPONINI" in the last 168 hours. BNP: Invalid input(s): "POCBNP" CBG: Recent Labs  Lab 03/28/23 2205 03/29/23 0740  03/29/23 1133  GLUCAP 160* 143* 228*   D-Dimer No results for input(s): "DDIMER" in the last 72 hours. Hgb A1c No results for input(s): "HGBA1C" in the last 72 hours. Lipid Profile No results for input(s): "CHOL", "HDL", "LDLCALC", "TRIG", "CHOLHDL", "LDLDIRECT" in the last 72 hours. Thyroid function studies No results for input(s): "TSH", "T4TOTAL", "T3FREE", "THYROIDAB" in the last 72 hours.  Invalid input(s): "FREET3" Anemia work up No results for input(s): "VITAMINB12", "FOLATE", "FERRITIN", "TIBC", "IRON", "RETICCTPCT" in the last 72 hours. Microbiology No results found for this or any previous visit (from the past 240 hour(s)).   Discharge Instructions:   Discharge Instructions     Call MD for:  persistant nausea and vomiting   Complete by: As directed    Call MD for:  severe uncontrolled pain   Complete by: As directed    Call MD for:  temperature >100.4   Complete by: As directed    Diet - low sodium heart healthy   Complete by: As directed    Diet Carb Modified   Complete by: As directed    Discharge instructions   Complete by: As directed    Follow-up with your primary care provider in 1 week.  Seek medical attention for worsening symptoms.  No alcohol, fatty fried food. Increase fluid intake.   Increase activity slowly   Complete by: As directed       Allergies as of 03/29/2023  Reactions   Other Other (See Comments)   Cannot take decongestants due to Cardiac history   Tramadol Other (See Comments)   Heart rate drops very low, very shallow breathing   Isosorbide Nitrate Rash, Other (See Comments)   Causes skin rashes, severe headaches   Morphine And Codeine Other (See Comments)   Patient does not want to take this medication and it causes severe constipation.        Medication List     TAKE these medications    albuterol (2.5 MG/3ML) 0.083% nebulizer solution Commonly known as: PROVENTIL Take 2.5 mg by nebulization daily as needed for  wheezing or shortness of breath.   allopurinol 100 MG tablet Commonly known as: ZYLOPRIM Take 1 tablet (100 mg total) by mouth daily.   atorvastatin 40 MG tablet Commonly known as: LIPITOR Take 40 mg by mouth every morning.   colchicine 0.6 MG tablet Take 1 tablet (0.6 mg total) by mouth daily. What changed:  when to take this reasons to take this   dicyclomine 10 MG capsule Commonly known as: BENTYL Take 1 capsule (10 mg total) by mouth 3 (three) times daily as needed for spasms (abdominal cramps).   Entresto 24-26 MG Generic drug: sacubitril-valsartan Take 1 tablet by mouth 2 (two) times daily.   furosemide 20 MG tablet Commonly known as: LASIX Take 40 mg by mouth daily.   hydrocortisone 25 MG suppository Commonly known as: ANUSOL-HC Place 1 suppository (25 mg total) rectally every 12 (twelve) hours for 7 days.   loratadine 10 MG tablet Commonly known as: CLARITIN Take 10 mg by mouth daily.   metFORMIN 1000 MG tablet Commonly known as: GLUCOPHAGE Take 1,000 mg by mouth 2 (two) times daily.   metoprolol tartrate 50 MG tablet Commonly known as: LOPRESSOR Take 50 mg by mouth 2 (two) times daily.   montelukast 10 MG tablet Commonly known as: SINGULAIR Take 10 mg by mouth every evening.   nitroGLYCERIN 0.4 MG SL tablet Commonly known as: NITROSTAT Place 0.4 mg under the tongue every 5 (five) minutes as needed for chest pain.   ondansetron 4 MG tablet Commonly known as: Zofran Take 1 tablet (4 mg total) by mouth daily as needed for nausea or vomiting.   spironolactone 25 MG tablet Commonly known as: ALDACTONE Take 25 mg by mouth every morning.   ticagrelor 60 MG Tabs tablet Commonly known as: BRILINTA Take 1 tablet (60 mg total) by mouth 2 (two) times daily.          Time coordinating discharge: 39 minutes  Signed:  Zyionna Pesce  [facility] 03/29/2023, 1:56 PM

## 2023-03-30 LAB — HEMOGLOBIN A1C
Hgb A1c MFr Bld: 6.3 % — ABNORMAL HIGH (ref 4.8–5.6)
Mean Plasma Glucose: 134 mg/dL

## 2023-04-10 ENCOUNTER — Other Ambulatory Visit (HOSPITAL_BASED_OUTPATIENT_CLINIC_OR_DEPARTMENT_OTHER): Payer: Self-pay

## 2023-04-10 ENCOUNTER — Other Ambulatory Visit: Payer: Self-pay

## 2023-04-10 ENCOUNTER — Emergency Department (HOSPITAL_BASED_OUTPATIENT_CLINIC_OR_DEPARTMENT_OTHER): Payer: No Typology Code available for payment source

## 2023-04-10 ENCOUNTER — Emergency Department (HOSPITAL_BASED_OUTPATIENT_CLINIC_OR_DEPARTMENT_OTHER): Payer: No Typology Code available for payment source | Admitting: Radiology

## 2023-04-10 ENCOUNTER — Encounter (HOSPITAL_BASED_OUTPATIENT_CLINIC_OR_DEPARTMENT_OTHER): Payer: Self-pay

## 2023-04-10 ENCOUNTER — Emergency Department (HOSPITAL_BASED_OUTPATIENT_CLINIC_OR_DEPARTMENT_OTHER)
Admission: EM | Admit: 2023-04-10 | Discharge: 2023-04-10 | Disposition: A | Discharge status: Home / Self Care | Payer: No Typology Code available for payment source | Attending: Emergency Medicine | Admitting: Emergency Medicine

## 2023-04-10 DIAGNOSIS — S20212A Contusion of left front wall of thorax, initial encounter: Secondary | ICD-10-CM | POA: Insufficient documentation

## 2023-04-10 DIAGNOSIS — M79632 Pain in left forearm: Secondary | ICD-10-CM | POA: Diagnosis present

## 2023-04-10 DIAGNOSIS — M542 Cervicalgia: Secondary | ICD-10-CM | POA: Insufficient documentation

## 2023-04-10 DIAGNOSIS — Y92410 Unspecified street and highway as the place of occurrence of the external cause: Secondary | ICD-10-CM | POA: Insufficient documentation

## 2023-04-10 DIAGNOSIS — I10 Essential (primary) hypertension: Secondary | ICD-10-CM | POA: Insufficient documentation

## 2023-04-10 DIAGNOSIS — S52592A Other fractures of lower end of left radius, initial encounter for closed fracture: Secondary | ICD-10-CM | POA: Diagnosis not present

## 2023-04-10 DIAGNOSIS — I2510 Atherosclerotic heart disease of native coronary artery without angina pectoris: Secondary | ICD-10-CM | POA: Diagnosis not present

## 2023-04-10 DIAGNOSIS — S298XXA Other specified injuries of thorax, initial encounter: Secondary | ICD-10-CM

## 2023-04-10 DIAGNOSIS — S0990XA Unspecified injury of head, initial encounter: Secondary | ICD-10-CM | POA: Insufficient documentation

## 2023-04-10 DIAGNOSIS — E119 Type 2 diabetes mellitus without complications: Secondary | ICD-10-CM | POA: Insufficient documentation

## 2023-04-10 MED ORDER — OXYCODONE HCL 5 MG PO TABS
5.0000 mg | ORAL_TABLET | Freq: Four times a day (QID) | ORAL | 0 refills | Status: AC | PRN
  Filled 2023-04-10: qty 9, 3d supply, fill #0

## 2023-04-10 MED ORDER — ATORVASTATIN CALCIUM 40 MG PO TABS
40.0000 mg | ORAL_TABLET | Freq: Every morning | ORAL | 1 refills | Status: AC
  Filled 2023-04-10: qty 30, 30d supply, fill #0

## 2023-04-10 MED ORDER — ACETAMINOPHEN 325 MG PO TABS: 650.0000 mg | ORAL_TABLET | Freq: Once | ORAL | Status: DC

## 2023-04-10 MED ORDER — TICAGRELOR 60 MG PO TABS
60.0000 mg | ORAL_TABLET | Freq: Two times a day (BID) | ORAL | 1 refills | Status: AC
  Filled 2023-04-10: qty 60, 30d supply, fill #0

## 2023-04-10 MED ORDER — OXYCODONE HCL 5 MG PO TABS
5.0000 mg | ORAL_TABLET | Freq: Once | ORAL | Status: AC
  Administered 2023-04-10: 5 mg via ORAL
  Filled 2023-04-10: qty 1

## 2023-04-10 MED ORDER — LIDOCAINE 5 % EX PTCH
1.0000 | MEDICATED_PATCH | CUTANEOUS | Status: DC
  Administered 2023-04-10: 1 via TRANSDERMAL
  Filled 2023-04-10: qty 1

## 2023-04-10 MED ORDER — ENTRESTO 24-26 MG PO TABS
1.0000 | ORAL_TABLET | Freq: Two times a day (BID) | ORAL | 1 refills | Status: AC
  Filled 2023-04-10: qty 60, 30d supply, fill #0

## 2023-04-10 MED ORDER — FUROSEMIDE 20 MG PO TABS
40.0000 mg | ORAL_TABLET | Freq: Two times a day (BID) | ORAL | 1 refills | Status: AC
  Filled 2023-04-10: qty 60, 15d supply, fill #0

## 2023-04-10 MED ORDER — METOPROLOL TARTRATE 50 MG PO TABS
50.0000 mg | ORAL_TABLET | Freq: Two times a day (BID) | ORAL | 1 refills | Status: AC
  Filled 2023-04-10: qty 60, 30d supply, fill #0

## 2023-04-10 MED ORDER — IBUPROFEN 400 MG PO TABS: 400.0000 mg | ORAL_TABLET | Freq: Once | ORAL | Status: DC

## 2023-04-10 MED ORDER — LIDOCAINE 5 % EX PTCH
1.0000 | MEDICATED_PATCH | CUTANEOUS | 0 refills | Status: AC
  Filled 2023-04-10: qty 30, 30d supply, fill #0

## 2023-04-10 MED ORDER — METFORMIN HCL 1000 MG PO TABS
1000.0000 mg | ORAL_TABLET | Freq: Two times a day (BID) | ORAL | 0 refills | Status: AC
  Filled 2023-04-10: qty 60, 30d supply, fill #0

## 2023-04-10 MED ORDER — SENNOSIDES-DOCUSATE SODIUM 8.6-50 MG PO TABS
1.0000 | ORAL_TABLET | Freq: Every day | ORAL | 0 refills | Status: AC
  Filled 2023-04-10: qty 30, 30d supply, fill #0
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
## 2023-04-10 NOTE — ED Notes (Signed)
Pt stated that he is allergic to the electrodes.
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
## 2023-04-10 NOTE — ED Notes (Signed)
Pt found sitting in chair of room. No monitoring equipment on patient. Pt encouraged to keep electrode on for cardiac monitoring. Refusing at this time. Pt allows for pulse-ox placement. This RN offers tylenol ordered. Pt refuses tylenol and ibuprofen. "I'll take pain meds, but I'm not taking tylenol or ibuprofen." Family sitting with patient at this time.

## 2023-04-10 NOTE — ED Notes (Signed)
Pt verbalized understanding of d/c instructions, meds, and followup care. Denies questions. VSS, no distress noted. Steady gait to exit with all belongings.  ?

## 2023-04-10 NOTE — ED Notes (Signed)
Pt sitting in chair for comfort.

## 2023-04-10 NOTE — ED Notes (Signed)
Patient refused to lay in stretcher for assessment. Placed in [facility] and in chair with Monitoring. Also refused C-Collar at this time stating he was "claustrophobic".
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
## 2023-04-11 ENCOUNTER — Other Ambulatory Visit: Payer: Self-pay

## 2023-04-12 ENCOUNTER — Other Ambulatory Visit (HOSPITAL_BASED_OUTPATIENT_CLINIC_OR_DEPARTMENT_OTHER): Payer: Self-pay

## 2023-04-12 ENCOUNTER — Other Ambulatory Visit: Payer: Self-pay

## 2023-04-14 ENCOUNTER — Encounter (HOSPITAL_BASED_OUTPATIENT_CLINIC_OR_DEPARTMENT_OTHER): Payer: Self-pay

## 2023-04-14 ENCOUNTER — Emergency Department (HOSPITAL_BASED_OUTPATIENT_CLINIC_OR_DEPARTMENT_OTHER): Payer: No Typology Code available for payment source

## 2023-04-14 ENCOUNTER — Emergency Department (HOSPITAL_BASED_OUTPATIENT_CLINIC_OR_DEPARTMENT_OTHER)
Admission: EM | Admit: 2023-04-14 | Discharge: 2023-04-14 | Disposition: A | Discharge status: Home / Self Care | Payer: No Typology Code available for payment source | Attending: Emergency Medicine | Admitting: Emergency Medicine

## 2023-04-14 DIAGNOSIS — R0781 Pleurodynia: Secondary | ICD-10-CM | POA: Insufficient documentation

## 2023-04-14 DIAGNOSIS — R1010 Upper abdominal pain, unspecified: Secondary | ICD-10-CM | POA: Insufficient documentation

## 2023-04-14 DIAGNOSIS — E119 Type 2 diabetes mellitus without complications: Secondary | ICD-10-CM | POA: Insufficient documentation

## 2023-04-14 DIAGNOSIS — Z79899 Other long term (current) drug therapy: Secondary | ICD-10-CM | POA: Insufficient documentation

## 2023-04-14 DIAGNOSIS — I2510 Atherosclerotic heart disease of native coronary artery without angina pectoris: Secondary | ICD-10-CM | POA: Diagnosis not present

## 2023-04-14 DIAGNOSIS — R0602 Shortness of breath: Secondary | ICD-10-CM

## 2023-04-14 DIAGNOSIS — I10 Essential (primary) hypertension: Secondary | ICD-10-CM | POA: Diagnosis not present

## 2023-04-14 MED ORDER — OXYCODONE-ACETAMINOPHEN 5-325 MG PO TABS: 1.0000 | ORAL_TABLET | Freq: Four times a day (QID) | ORAL | 0 refills | Status: DC | PRN

## 2023-04-14 MED ORDER — OXYCODONE-ACETAMINOPHEN 5-325 MG PO TABS
1.0000 | ORAL_TABLET | Freq: Once | ORAL | Status: AC
  Administered 2023-04-14: 1 via ORAL
  Filled 2023-04-14: qty 1
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
## 2023-04-14 NOTE — ED Notes (Signed)
RN reviewed discharge instructions with pt. Pt verbalized understanding and had no further questions. VSS upon discharge.  

## 2023-04-14 NOTE — Discharge Instructions (Addendum)
Your CT scans were reassuring today.  Please take tylenol/ibuprofen every 6 hours or Percocet as needed for pain. I recommend close follow-up with PCP for reevaluation.  Please do not hesitate to return to emergency department if worrisome signs symptoms we discussed become apparent.
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
## 2023-04-14 NOTE — ED Notes (Signed)
Pt declined to have ED EKG performed

## 2023-04-18 ENCOUNTER — Ambulatory Visit (INDEPENDENT_AMBULATORY_CARE_PROVIDER_SITE_OTHER): Payer: Medicaid Other | Admitting: Student

## 2023-04-18 ENCOUNTER — Other Ambulatory Visit (HOSPITAL_BASED_OUTPATIENT_CLINIC_OR_DEPARTMENT_OTHER): Payer: Self-pay

## 2023-04-18 DIAGNOSIS — M79642 Pain in left hand: Secondary | ICD-10-CM | POA: Diagnosis not present

## 2023-04-18 DIAGNOSIS — S52502A Unspecified fracture of the lower end of left radius, initial encounter for closed fracture: Secondary | ICD-10-CM

## 2023-04-18 DIAGNOSIS — S20219A Contusion of unspecified front wall of thorax, initial encounter: Secondary | ICD-10-CM | POA: Diagnosis not present

## 2023-04-18 MED ORDER — OXYCODONE HCL 5 MG PO TABS
5.0000 mg | ORAL_TABLET | Freq: Four times a day (QID) | ORAL | 0 refills | Status: AC | PRN
  Filled 2023-04-18: qty 12, 3d supply, fill #0
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
## 2023-04-24 ENCOUNTER — Emergency Department (HOSPITAL_BASED_OUTPATIENT_CLINIC_OR_DEPARTMENT_OTHER): Payer: Medicaid Other | Admitting: Radiology

## 2023-04-24 ENCOUNTER — Emergency Department (HOSPITAL_BASED_OUTPATIENT_CLINIC_OR_DEPARTMENT_OTHER)
Admission: EM | Admit: 2023-04-24 | Discharge: 2023-04-24 | Disposition: A | Discharge status: Home / Self Care | Payer: Medicaid Other | Attending: Emergency Medicine | Admitting: Emergency Medicine

## 2023-04-24 ENCOUNTER — Other Ambulatory Visit: Payer: Self-pay

## 2023-04-24 DIAGNOSIS — Z7984 Long term (current) use of oral hypoglycemic drugs: Secondary | ICD-10-CM | POA: Insufficient documentation

## 2023-04-24 DIAGNOSIS — I2510 Atherosclerotic heart disease of native coronary artery without angina pectoris: Secondary | ICD-10-CM | POA: Diagnosis not present

## 2023-04-24 DIAGNOSIS — E119 Type 2 diabetes mellitus without complications: Secondary | ICD-10-CM | POA: Diagnosis not present

## 2023-04-24 DIAGNOSIS — M25561 Pain in right knee: Secondary | ICD-10-CM | POA: Diagnosis present

## 2023-04-24 DIAGNOSIS — M109 Gout, unspecified: Secondary | ICD-10-CM

## 2023-04-24 MED ORDER — OXYCODONE-ACETAMINOPHEN 5-325 MG PO TABS
2.0000 | ORAL_TABLET | Freq: Once | ORAL | Status: AC
  Administered 2023-04-24: 2 via ORAL
  Filled 2023-04-24: qty 2

## 2023-04-24 MED ORDER — OXYCODONE-ACETAMINOPHEN 5-325 MG PO TABS: 1.0000 | ORAL_TABLET | Freq: Four times a day (QID) | ORAL | 0 refills | Status: AC | PRN

## 2023-04-24 MED ORDER — COLCHICINE 0.6 MG PO TABS: 0.6000 mg | ORAL_TABLET | Freq: Three times a day (TID) | ORAL | 1 refills | Status: DC

## 2023-04-24 MED ORDER — KETOROLAC TROMETHAMINE 60 MG/2ML IM SOLN
60.0000 mg | Freq: Once | INTRAMUSCULAR | Status: AC
  Administered 2023-04-24: 60 mg via INTRAMUSCULAR
  Filled 2023-04-24: qty 2
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
## 2023-04-24 NOTE — Discharge Instructions (Signed)
Begin taking colchicine as prescribed.  Begin taking Percocet as prescribed as needed for pain.  Follow-up with primary doctor if not improving in the next 3 to 4 days.
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
## 2023-05-16 ENCOUNTER — Ambulatory Visit (HOSPITAL_BASED_OUTPATIENT_CLINIC_OR_DEPARTMENT_OTHER): Payer: Medicaid Other | Admitting: Student

## 2023-06-15 ENCOUNTER — Encounter: Payer: Self-pay | Admitting: Cardiovascular Disease

## 2023-06-15 ENCOUNTER — Ambulatory Visit: Payer: Medicaid Other | Attending: Cardiovascular Disease | Admitting: Cardiovascular Disease

## 2023-07-17 ENCOUNTER — Encounter (HOSPITAL_BASED_OUTPATIENT_CLINIC_OR_DEPARTMENT_OTHER): Payer: Self-pay

## 2023-07-17 ENCOUNTER — Emergency Department (HOSPITAL_BASED_OUTPATIENT_CLINIC_OR_DEPARTMENT_OTHER): Payer: Medicaid Other | Admitting: Radiology

## 2023-07-17 ENCOUNTER — Other Ambulatory Visit: Payer: Self-pay

## 2023-07-17 ENCOUNTER — Emergency Department (HOSPITAL_BASED_OUTPATIENT_CLINIC_OR_DEPARTMENT_OTHER)
Admission: EM | Admit: 2023-07-17 | Discharge: 2023-07-17 | Disposition: A | Discharge status: Home / Self Care | Payer: Medicaid Other | Attending: Emergency Medicine | Admitting: Emergency Medicine

## 2023-07-17 DIAGNOSIS — M79662 Pain in left lower leg: Secondary | ICD-10-CM | POA: Insufficient documentation

## 2023-07-17 DIAGNOSIS — M79605 Pain in left leg: Secondary | ICD-10-CM

## 2023-07-17 DIAGNOSIS — F17290 Nicotine dependence, other tobacco product, uncomplicated: Secondary | ICD-10-CM | POA: Insufficient documentation

## 2023-07-17 DIAGNOSIS — I10 Essential (primary) hypertension: Secondary | ICD-10-CM | POA: Diagnosis not present

## 2023-07-17 DIAGNOSIS — E119 Type 2 diabetes mellitus without complications: Secondary | ICD-10-CM | POA: Diagnosis not present

## 2023-07-17 DIAGNOSIS — I2510 Atherosclerotic heart disease of native coronary artery without angina pectoris: Secondary | ICD-10-CM | POA: Diagnosis not present

## 2023-07-17 LAB — CBC
HCT: 40.7 % (ref 39.0–52.0)
Hemoglobin: 14.7 g/dL (ref 13.0–17.0)
MCH: 31.2 pg (ref 26.0–34.0)
MCHC: 36.1 g/dL — ABNORMAL HIGH (ref 30.0–36.0)
MCV: 86.4 fL (ref 80.0–100.0)
Platelets: 301 10*3/uL (ref 150–400)
RBC: 4.71 MIL/uL (ref 4.22–5.81)
RDW: 12.9 % (ref 11.5–15.5)
WBC: 12 10*3/uL — ABNORMAL HIGH (ref 4.0–10.5)
nRBC: 0 % (ref 0.0–0.2)

## 2023-07-17 LAB — BASIC METABOLIC PANEL
Anion gap: 16 — ABNORMAL HIGH (ref 5–15)
BUN: 15 mg/dL (ref 6–20)
CO2: 27 mmol/L (ref 22–32)
Calcium: 7.4 mg/dL — ABNORMAL LOW (ref 8.9–10.3)
Chloride: 91 mmol/L — ABNORMAL LOW (ref 98–111)
Creatinine, Ser: 1.15 mg/dL (ref 0.61–1.24)
GFR, Estimated: >60 mL/min (ref >60)
Glucose, Bld: 153 mg/dL — ABNORMAL HIGH (ref 70–99)
Potassium: 2.7 mmol/L — CL (ref 3.5–5.1)
Sodium: 134 mmol/L — ABNORMAL LOW (ref 135–145)

## 2023-07-17 LAB — TROPONIN I (HIGH SENSITIVITY)
Troponin I (High Sensitivity): 19 ng/L — ABNORMAL HIGH (ref <18)
Troponin I (High Sensitivity): 18 ng/L — ABNORMAL HIGH (ref <18)

## 2023-07-17 MED ORDER — HYDROMORPHONE HCL 1 MG/ML IJ SOLN
1.0000 mg | Freq: Once | INTRAMUSCULAR | Status: AC
  Administered 2023-07-17: 1 mg via INTRAVENOUS
  Filled 2023-07-17: qty 1

## 2023-07-17 MED ORDER — COLCHICINE 0.6 MG PO TABS: 0.6000 mg | ORAL_TABLET | Freq: Two times a day (BID) | ORAL | 0 refills | Status: AC

## 2023-07-17 MED ORDER — POTASSIUM CHLORIDE CRYS ER 20 MEQ PO TBCR
40.0000 meq | EXTENDED_RELEASE_TABLET | Freq: Once | ORAL | Status: AC
  Administered 2023-07-17: 40 meq via ORAL
  Filled 2023-07-17: qty 2

## 2023-07-17 MED ORDER — KETOROLAC TROMETHAMINE 15 MG/ML IJ SOLN
15.0000 mg | Freq: Once | INTRAMUSCULAR | Status: AC
  Administered 2023-07-17: 15 mg via INTRAVENOUS
  Filled 2023-07-17: qty 1

## 2023-07-17 MED ORDER — CALCIUM CARBONATE ANTACID 500 MG PO CHEW
1.0000 | CHEWABLE_TABLET | Freq: Once | ORAL | Status: AC
  Administered 2023-07-17: 200 mg via ORAL
  Filled 2023-07-17: qty 1
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
## 2023-07-17 NOTE — ED Triage Notes (Signed)
Patient arrives to ED POV C/O [facility] and Left leg pain x2hrs ago. Pt stated that the leg pain woke him up from his sleep and is also having tingling sensation on his left leg. Pt also c/o SHOB and CP that started when he woke up. Pt noted to be diaphoretic in triage. Hx of cardiac stents and blood clots and is on Eliquis. No other complaints at this time. Pt A/Ox4

## 2023-07-17 NOTE — Discharge Instructions (Signed)
You were evaluated in the Emergency Department and after careful evaluation, we did not find any emergent condition requiring admission or further testing in the hospital.  Your exam/testing today is overall reassuring.  Symptoms may be due to gout.  Take colchicine medication 2 times daily as needed for pain.  Follow-up with your regular doctors.  Please return to the Emergency Department if you experience any worsening of your condition.   Thank you for allowing [place] to be a part of your care.

## 2023-10-28 ENCOUNTER — Emergency Department (HOSPITAL_BASED_OUTPATIENT_CLINIC_OR_DEPARTMENT_OTHER)
Admission: EM | Admit: 2023-10-28 | Discharge: 2023-10-28 | Disposition: A | Discharge status: Home / Self Care | Payer: Medicaid Other | Attending: Emergency Medicine | Admitting: Emergency Medicine

## 2023-10-28 ENCOUNTER — Encounter (HOSPITAL_BASED_OUTPATIENT_CLINIC_OR_DEPARTMENT_OTHER): Payer: Self-pay | Admitting: Emergency Medicine

## 2023-10-28 ENCOUNTER — Other Ambulatory Visit: Payer: Self-pay

## 2023-10-28 DIAGNOSIS — M109 Gout, unspecified: Secondary | ICD-10-CM | POA: Diagnosis not present

## 2023-10-28 DIAGNOSIS — E876 Hypokalemia: Secondary | ICD-10-CM | POA: Insufficient documentation

## 2023-10-28 DIAGNOSIS — D72829 Elevated white blood cell count, unspecified: Secondary | ICD-10-CM | POA: Insufficient documentation

## 2023-10-28 DIAGNOSIS — M79605 Pain in left leg: Secondary | ICD-10-CM | POA: Diagnosis present

## 2023-10-28 HISTORY — DX: Presence of automatic (implantable) cardiac defibrillator: Z95.810

## 2023-10-28 LAB — CBC WITH DIFFERENTIAL/PLATELET
Abs Immature Granulocytes: 0.05 10*3/uL (ref 0.00–0.07)
Basophils Absolute: 0.1 10*3/uL (ref 0.0–0.1)
Basophils Relative: 0 %
Eosinophils Absolute: 0.4 10*3/uL (ref 0.0–0.5)
Eosinophils Relative: 3 %
HCT: 39.7 % (ref 39.0–52.0)
Hemoglobin: 14 g/dL (ref 13.0–17.0)
Immature Granulocytes: 0 %
Lymphocytes Relative: 21 %
Lymphs Abs: 2.4 10*3/uL (ref 0.7–4.0)
MCH: 31.9 pg (ref 26.0–34.0)
MCHC: 35.3 g/dL (ref 30.0–36.0)
MCV: 90.4 fL (ref 80.0–100.0)
Monocytes Absolute: 0.8 10*3/uL (ref 0.1–1.0)
Monocytes Relative: 7 %
Neutro Abs: 7.8 10*3/uL — ABNORMAL HIGH (ref 1.7–7.7)
Neutrophils Relative %: 69 %
Platelets: 338 10*3/uL (ref 150–400)
RBC: 4.39 MIL/uL (ref 4.22–5.81)
RDW: 13.3 % (ref 11.5–15.5)
WBC: 11.4 10*3/uL — ABNORMAL HIGH (ref 4.0–10.5)
nRBC: 0 % (ref 0.0–0.2)

## 2023-10-28 LAB — COMPREHENSIVE METABOLIC PANEL
ALT: 21 U/L (ref 0–44)
AST: 15 U/L (ref 15–41)
Albumin: 4.2 g/dL (ref 3.5–5.0)
Alkaline Phosphatase: 37 U/L — ABNORMAL LOW (ref 38–126)
Anion gap: 12 (ref 5–15)
BUN: 7 mg/dL (ref 6–20)
CO2: 29 mmol/L (ref 22–32)
Calcium: 7 mg/dL — ABNORMAL LOW (ref 8.9–10.3)
Chloride: 98 mmol/L (ref 98–111)
Creatinine, Ser: 1.05 mg/dL (ref 0.61–1.24)
GFR, Estimated: >60 mL/min (ref >60)
Glucose, Bld: 167 mg/dL — ABNORMAL HIGH (ref 70–99)
Potassium: 3.1 mmol/L — ABNORMAL LOW (ref 3.5–5.1)
Sodium: 139 mmol/L (ref 135–145)
Total Bilirubin: 0.6 mg/dL (ref <1.2)
Total Protein: 7.2 g/dL (ref 6.5–8.1)

## 2023-10-28 MED ORDER — OXYCODONE-ACETAMINOPHEN 5-325 MG PO TABS
2.0000 | ORAL_TABLET | Freq: Once | ORAL | Status: AC
  Administered 2023-10-28: 2 via ORAL
  Filled 2023-10-28: qty 2

## 2023-10-28 MED ORDER — COLCHICINE 0.6 MG PO TABS
0.6000 mg | ORAL_TABLET | Freq: Every day | ORAL | 0 refills | Status: AC
Start: 2023-10-28 — End: ?

## 2023-10-28 MED ORDER — POTASSIUM CHLORIDE CRYS ER 20 MEQ PO TBCR
40.0000 meq | EXTENDED_RELEASE_TABLET | Freq: Once | ORAL | Status: AC
  Administered 2023-10-28: 40 meq via ORAL
  Filled 2023-10-28: qty 2

## 2023-10-28 MED ORDER — OXYCODONE-ACETAMINOPHEN 5-325 MG PO TABS
1.0000 | ORAL_TABLET | Freq: Four times a day (QID) | ORAL | 0 refills | Status: AC | PRN
Start: 2023-10-28 — End: ?

## 2023-10-28 MED ORDER — KETOROLAC TROMETHAMINE 15 MG/ML IJ SOLN
15.0000 mg | Freq: Once | INTRAMUSCULAR | Status: AC
  Administered 2023-10-28: 15 mg via INTRAMUSCULAR
  Filled 2023-10-28: qty 1

## 2023-10-28 NOTE — ED Triage Notes (Signed)
Left leg /hip /foot extremely tender ,has hx gout ,states it is gout.

## 2023-10-28 NOTE — Discharge Instructions (Addendum)
You were seen in the ER today for concerns of leg pain and swelling. Your labs were reassuring and you responded well to medications given to you here. I suspect this is likely from your gout and a refill for your colchicine was sent to your pharmacy. Please take this as prescribed. Return to the ER if symptoms are worsening, otherwise follow up with your primary care provider.

## 2023-10-28 NOTE — ED Provider Notes (Signed)
 [place] EMERGENCY DEPARTMENT AT [facility] Provider Note   CSN: [id number] Arrival date & time: 10/28/23  1126     History Chief Complaint  Patient presents with   Leg Pain    Gerald Simpson is a 57 y.o. male.  Patient past history significant for gout presents the emergency department concerns of leg pain.  Reports he been having left hip, left knee, and left foot tenderness.  States that he has a history of gout and has been out of his colchicine.  Reports that he has been trying to work on diet to reduce the risk of gout flareups but continues to have intermittent flareups.  States he is having some difficulty with ambulation due to significant pain.  Denies any recent falls, injuries, or other specific cause of pain.   Leg Pain      Home Medications Prior to Admission medications   Medication Sig Start Date End Date Taking? Authorizing Provider  colchicine 0.6 MG tablet Take 1 tablet (0.6 mg total) by mouth daily. 10/28/23  Yes Smitty Knudsen, PA-C  oxyCODONE-acetaminophen (PERCOCET/ROXICET) 5-325 MG tablet Take 1 tablet by mouth every 6 (six) hours as needed for severe pain (pain score 7-10). 10/28/23  Yes Maryanna Shape A, PA-C  albuterol (PROVENTIL) (2.5 MG/3ML) 0.083% nebulizer solution Take 2.5 mg by nebulization daily as needed for wheezing or shortness of breath.    [provider]  allopurinol (ZYLOPRIM) 100 MG tablet Take 1 tablet (100 mg total) by mouth daily. Patient not taking: Reported on 03/28/2023 08/06/22   Marrianne Mood, MD  atorvastatin (LIPITOR) 40 MG tablet Take 1 tablet (40 mg total) by mouth every morning. 04/10/23   Elayne Snare K, DO  colchicine 0.6 MG tablet Take 1 tablet (0.6 mg total) by mouth 2 (two) times daily for 5 days. 07/17/23 07/22/23  Sabas Sous, MD  dicyclomine (BENTYL) 10 MG capsule Take 1 capsule (10 mg total) by mouth 3 (three) times daily as needed for spasms (abdominal cramps). 03/29/23   Pokhrel,  Laxman, MD  ENTRESTO 24-26 MG Take 1 tablet by mouth 2 (two) times daily. 04/10/23   Elayne Snare K, DO  furosemide (LASIX) 20 MG tablet Take 2 tablets (40 mg total) by mouth 2 (two) times daily. 04/10/23 05/10/23  Elayne Snare K, DO  lidocaine (LIDODERM) 5 % Place 1 patch onto the skin daily. Remove & Discard patch within 12 hours or as directed by MD 04/10/23   Elayne Snare K, DO  metFORMIN (GLUCOPHAGE) 1000 MG tablet Take 1 tablet (1,000 mg total) by mouth 2 (two) times daily. 04/10/23 05/10/23  Elayne Snare K, DO  metoprolol tartrate (LOPRESSOR) 50 MG tablet Take 1 tablet (50 mg total) by mouth 2 (two) times daily. 04/10/23 05/10/23  Elayne Snare K, DO  montelukast (SINGULAIR) 10 MG tablet Take 10 mg by mouth every evening.    [provider]  nitroGLYCERIN (NITROSTAT) 0.4 MG SL tablet Place 0.4 mg under the tongue every 5 (five) minutes as needed for chest pain.     [provider]  ondansetron (ZOFRAN) 4 MG tablet Take 1 tablet (4 mg total) by mouth daily as needed for nausea or vomiting. 03/29/23   Pokhrel, Rebekah Chesterfield, MD  oxyCODONE (ROXICODONE) 5 MG immediate release tablet Take 1 tablet (5 mg total) by mouth every 6 (six) hours as needed for severe pain. 04/10/23   Rexford Maus, DO  oxyCODONE-acetaminophen (PERCOCET) 5-325 MG tablet Take 1-2 tablets by mouth every 6 (six)  hours as needed. 04/24/23   Geoffery Lyons, MD  senna-docusate (SENOKOT-S) 8.6-50 MG tablet Take 1 tablet by mouth daily. 04/10/23   Rexford Maus, DO  spironolactone (ALDACTONE) 25 MG tablet Take 25 mg by mouth every morning. 07/04/22   [provider]  ticagrelor (BRILINTA) 60 MG TABS tablet Take 1 tablet (60 mg total) by mouth 2 (two) times daily. 04/10/23   Elayne Snare K, DO      Allergies    Other, Tramadol, Isosorbide nitrate, and Morphine and codeine    Review of Systems   Review of Systems  Musculoskeletal:        Left knee pain  All other systems reviewed and  are negative.   Physical Exam Updated Vital Signs BP 135/72 (BP Location: Left Arm)   Pulse 65   Temp 98 F (36.7 C)   Resp 18   SpO2 98%  Physical Exam Vitals and nursing note reviewed.  Constitutional:      General: He is not in acute distress.    Appearance: He is well-developed.  HENT:     Head: Normocephalic and atraumatic.  Eyes:     Conjunctiva/sclera: Conjunctivae normal.  Cardiovascular:     Rate and Rhythm: Normal rate and regular rhythm.     Heart sounds: No murmur heard. Pulmonary:     Effort: Pulmonary effort is normal. No respiratory distress.     Breath sounds: Normal breath sounds.  Abdominal:     Palpations: Abdomen is soft.     Tenderness: There is no abdominal tenderness.  Musculoskeletal:        General: Tenderness present. No swelling, deformity or signs of injury. Normal range of motion.     Cervical back: Neck supple.     Comments: Patient with tenderness to palpation over the left knee, left hip, and left foot.  These areas are not erythematous, warm to the touch, or with notable swelling.  Skin:    General: Skin is warm and dry.     Capillary Refill: Capillary refill takes less than 2 seconds.  Neurological:     Mental Status: He is alert.  Psychiatric:        Mood and Affect: Mood normal.     ED Results / Procedures / Treatments   Labs (all labs ordered are listed, but only abnormal results are displayed) Labs Reviewed  CBC WITH DIFFERENTIAL/PLATELET - Abnormal; Notable for the following components:      Result Value   WBC 11.4 (*)    Neutro Abs 7.8 (*)    All other components within normal limits  COMPREHENSIVE METABOLIC PANEL - Abnormal; Notable for the following components:   Potassium 3.1 (*)    Glucose, Bld 167 (*)    Calcium 7.0 (*)    Alkaline Phosphatase 37 (*)    All other components within normal limits    EKG None  Radiology No results found.  Procedures Procedures  Medications Ordered in ED Medications   ketorolac (TORADOL) 15 MG/ML injection 15 mg (15 mg Intramuscular Given 10/28/23 1241)  oxyCODONE-acetaminophen (PERCOCET/ROXICET) 5-325 MG per tablet 2 tablet (2 tablets Oral Given 10/28/23 1241)  potassium chloride SA (KLOR-CON M) CR tablet 40 mEq (40 mEq Oral Given 10/28/23 1350)    ED Course/ Medical Decision Making/ A&P                                 Medical Decision Making Amount  and/or Complexity of Data Reviewed Labs: ordered.  Risk Prescription drug management.   This patient presents to the ED for concern of leg pain.  Differential diagnosis includes gout, septic arthritis, osteoarthritis, muscle strain   Lab Tests:  I Ordered, and personally interpreted labs.  The pertinent results include: CBC with mild leukocytosis at 11.4, CMP with mild hypokalemia 3.1, hypocalcemia 7.0   Medicines ordered and prescription drug management:  I ordered medication including Toradol, Percocet, potassium chloride for pain, hypokalemia Reevaluation of the patient after these medicines showed that the patient improved I have reviewed the patients home medicines and have made adjustments as needed   Problem List / ED Course:  Patient presents the emergency department concerns of gout.  He reports pain in the left leg at the left hip, left knee, and left foot.  Denies any recent erythema, swelling, or induration of these areas.  No recent IV drug use.  Has been seen previously for similar concerns and been diagnosed with gout.  States he has been taking colchicine for this previously but has been out of this medication has been unable to take this to attempt to resolve the symptoms.  Denies any recent fever chills or bodyaches.  Given that there is no acute injury, imaging likely not of benefit at this time. On exam, there is no evident erythema or swelling over these areas.  Doubt septic joints.  Will proceed with treatment with Toradol and Percocet.  Reassess shortly. After ministration  his medications, patient is reports significant proved in pain.  Able to ambulate without difficulty.  Dose of potassium given for hypokalemia.  This appears to be somewhat of a chronic problem for patient's encourage patient follow-up with PCP for this. Given resolution of symptoms with administration of medications here in the emergency department, will send patient off with a prescription for colchicine as well as Percocet.  Advise close follow-up with PCP for further management of this.  No other acute concerns at this time that warrant further evaluation or need for admission at this time.  Patient discharged home in stable condition.   Final Clinical Impression(s) / ED Diagnoses Final diagnoses:  Acute gout of left knee, unspecified cause  Hypokalemia    Rx / DC Orders ED Discharge Orders          Ordered    colchicine 0.6 MG tablet  Daily        10/28/23 1427    oxyCODONE-acetaminophen (PERCOCET/ROXICET) 5-325 MG tablet  Every 6 hours PRN        10/28/23 1427              Smitty Knudsen, PA-C 10/28/23 1756    Virgina Norfolk, DO 10/29/23 [phone]

## 2023-10-28 NOTE — ED Notes (Signed)
Pt alert and oriented X 4 at the time of discharge. RR even and unlabored. No acute distress noted. Pt verbalized understanding of discharge instructions as discussed. Pt ambulatory to lobby at time of discharge.

## 2023-12-10 ENCOUNTER — Other Ambulatory Visit: Payer: Self-pay

## 2023-12-10 ENCOUNTER — Emergency Department (HOSPITAL_BASED_OUTPATIENT_CLINIC_OR_DEPARTMENT_OTHER): Payer: Medicaid Other | Admitting: Radiology

## 2023-12-10 ENCOUNTER — Emergency Department (HOSPITAL_BASED_OUTPATIENT_CLINIC_OR_DEPARTMENT_OTHER)
Admission: EM | Admit: 2023-12-10 | Discharge: 2023-12-10 | Disposition: A | Discharge status: Home / Self Care | Payer: Medicaid Other

## 2023-12-10 DIAGNOSIS — X501XXA Overexertion from prolonged static or awkward postures, initial encounter: Secondary | ICD-10-CM | POA: Insufficient documentation

## 2023-12-10 DIAGNOSIS — Z7984 Long term (current) use of oral hypoglycemic drugs: Secondary | ICD-10-CM | POA: Diagnosis not present

## 2023-12-10 DIAGNOSIS — Z79899 Other long term (current) drug therapy: Secondary | ICD-10-CM | POA: Insufficient documentation

## 2023-12-10 DIAGNOSIS — S93491A Sprain of other ligament of right ankle, initial encounter: Secondary | ICD-10-CM | POA: Diagnosis present

## 2023-12-10 MED ORDER — OXYCODONE-ACETAMINOPHEN 5-325 MG PO TABS
1.0000 | ORAL_TABLET | Freq: Once | ORAL | Status: AC
  Administered 2023-12-10: 1 via ORAL
  Filled 2023-12-10: qty 1

## 2023-12-10 NOTE — ED Provider Notes (Signed)
[place] EMERGENCY DEPARTMENT AT [facility] Provider Note   CSN: [id number] Arrival date & time: 12/10/23  0543     History  No chief complaint on file.   Gerald Simpson is a 58 y.o. male.  58 year old male presenting emergency department with right ankle pain.  Reports that he stepped awkwardly off a step 2 days ago and twisted his ankle has had pain since then.  No numbness tingling changes in sensation.  Has been wearing a cam boot that he had from other foot.  He has been ambulatory.  Presented today as he notes that pain has persisted.        Home Medications Prior to Admission medications   Medication Sig Start Date End Date Taking? Authorizing Provider  albuterol (PROVENTIL) (2.5 MG/3ML) 0.083% nebulizer solution Take 2.5 mg by nebulization daily as needed for wheezing or shortness of breath.    [provider]  allopurinol (ZYLOPRIM) 100 MG tablet Take 1 tablet (100 mg total) by mouth daily. Patient not taking: Reported on 03/28/2023 08/06/22   Marrianne Mood, MD  atorvastatin (LIPITOR) 40 MG tablet Take 1 tablet (40 mg total) by mouth every morning. 04/10/23   Elayne Snare K, DO  colchicine 0.6 MG tablet Take 1 tablet (0.6 mg total) by mouth 2 (two) times daily for 5 days. 07/17/23 07/22/23  Sabas Sous, MD  colchicine 0.6 MG tablet Take 1 tablet (0.6 mg total) by mouth daily. 10/28/23   Smitty Knudsen, PA-C  dicyclomine (BENTYL) 10 MG capsule Take 1 capsule (10 mg total) by mouth 3 (three) times daily as needed for spasms (abdominal cramps). 03/29/23   Pokhrel, Laxman, MD  ENTRESTO 24-26 MG Take 1 tablet by mouth 2 (two) times daily. 04/10/23   Elayne Snare K, DO  furosemide (LASIX) 20 MG tablet Take 2 tablets (40 mg total) by mouth 2 (two) times daily. 04/10/23 05/10/23  Elayne Snare K, DO  lidocaine (LIDODERM) 5 % Place 1 patch onto the skin daily. Remove & Discard patch within 12 hours or as directed by MD 04/10/23   Elayne Snare K, DO  metFORMIN (GLUCOPHAGE) 1000 MG tablet Take 1 tablet (1,000 mg total) by mouth 2 (two) times daily. 04/10/23 05/10/23  Elayne Snare K, DO  metoprolol tartrate (LOPRESSOR) 50 MG tablet Take 1 tablet (50 mg total) by mouth 2 (two) times daily. 04/10/23 05/10/23  Elayne Snare K, DO  montelukast (SINGULAIR) 10 MG tablet Take 10 mg by mouth every evening.    [provider]  nitroGLYCERIN (NITROSTAT) 0.4 MG SL tablet Place 0.4 mg under the tongue every 5 (five) minutes as needed for chest pain.     [provider]  ondansetron (ZOFRAN) 4 MG tablet Take 1 tablet (4 mg total) by mouth daily as needed for nausea or vomiting. 03/29/23   Pokhrel, Rebekah [place], MD  oxyCODONE (ROXICODONE) 5 MG immediate release tablet Take 1 tablet (5 mg total) by mouth every 6 (six) hours as needed for severe pain. 04/10/23   Rexford Maus, DO  oxyCODONE-acetaminophen (PERCOCET) 5-325 MG tablet Take 1-2 tablets by mouth every 6 (six) hours as needed. 04/24/23   Geoffery Lyons, MD  oxyCODONE-acetaminophen (PERCOCET/ROXICET) 5-325 MG tablet Take 1 tablet by mouth every 6 (six) hours as needed for severe pain (pain score 7-10). 10/28/23   Smitty Knudsen, PA-C  senna-docusate (SENOKOT-S) 8.6-50 MG tablet Take 1 tablet by mouth daily. 04/10/23   Elayne Snare K, DO  spironolactone (ALDACTONE) 25 MG tablet  Take 25 mg by mouth every morning. 07/04/22   [provider]  ticagrelor (BRILINTA) 60 MG TABS tablet Take 1 tablet (60 mg total) by mouth 2 (two) times daily. 04/10/23   Rexford Maus, DO      Allergies    Other, Tramadol, Isosorbide nitrate, and Morphine and codeine    Review of Systems   Review of Systems  Physical Exam Updated Vital Signs BP 123/82   Pulse 66   Temp 98.6 F (37 C) (Temporal)   Resp 18   Ht 5\' 10"  (1.778 m)   Wt 117.9 kg   SpO2 97%   BMI 37.31 kg/m  Physical Exam Vitals and nursing note reviewed.  Constitutional:      General: He is not in  acute distress.    Appearance: He is not toxic-appearing.  HENT:     Head: Normocephalic.     Mouth/Throat:     Mouth: Mucous membranes are moist.  Eyes:     Conjunctiva/sclera: Conjunctivae normal.  Cardiovascular:     Rate and Rhythm: Normal rate.  Pulmonary:     Effort: Pulmonary effort is normal.  Musculoskeletal:     Comments: Right foot in cam boot.  Removed.  Some diffuse swelling to the medial malleolus and proximal dorsal aspect of the foot.  2+ DP pulses.  Neurovascular intact.  Joint is stable.  Skin:    General: Skin is warm.     Capillary Refill: Capillary refill takes less than 2 seconds.  Neurological:     Mental Status: He is alert and oriented to person, place, and time.  Psychiatric:        Mood and Affect: Mood normal.        Behavior: Behavior normal.     ED Results / Procedures / Treatments   Labs (all labs ordered are listed, but only abnormal results are displayed) Labs Reviewed - No data to display  EKG None  Radiology DG Foot Complete Right Result Date: 12/10/2023 CLINICAL DATA:  Foot pain EXAM: RIGHT FOOT COMPLETE - 3+ VIEW COMPARISON:  None Available. FINDINGS: There is no evidence of fracture or dislocation. There is no evidence of arthropathy or other focal bone abnormality. Soft tissues are unremarkable. IMPRESSION: Negative. Electronically Signed   By: Kennith Center M.D.   On: 12/10/2023 06:30   DG Ankle Complete Right Result Date: 12/10/2023 CLINICAL DATA:  Foot pain. EXAM: RIGHT ANKLE - COMPLETE 3+ VIEW COMPARISON:  None Available. FINDINGS: There is no evidence of fracture, dislocation, or joint effusion. There is no evidence of arthropathy or other focal bone abnormality. Soft tissues are unremarkable. IMPRESSION: Negative. Electronically Signed   By: Kennith Center M.D.   On: 12/10/2023 06:29    Procedures Procedures    Medications Ordered in ED Medications  oxyCODONE-acetaminophen (PERCOCET/ROXICET) 5-325 MG per tablet 1 tablet (has no  administration in time range)    ED Course/ Medical Decision Making/ A&P                                 Medical Decision Making 58 year old male presenting emergency department with right ankle pain after twisting his ankle.  Afebrile vital signs reassuring.  Physical exam neurovascularly intact.  X-rays of ankle and foot with no acute bony pathology.  Suspect sprain.  Discussed supportive care.  Patient is ambulatory.  Complaining of increased pain; given Percocet here for breakthrough pain.  Discussed follow-up with orthopedic/podiatrist.  Stable for discharge at this time.  Amount and/or Complexity of Data Reviewed Radiology: ordered.  Risk Prescription drug management.         Final Clinical Impression(s) / ED Diagnoses Final diagnoses:  Sprain of anterior talofibular ligament of right ankle, initial encounter    Rx / DC Orders ED Discharge Orders     None         Coral Spikes, DO 12/10/23 1308

## 2023-12-10 NOTE — ED Triage Notes (Signed)
Pt POV from home reporting R ankle/ foot pain, denies swelling, trauma or injury. Hx gout

## 2023-12-10 NOTE — ED Notes (Signed)
Pt refused ace wrap and crutches, stating he has both at home.
